# Patient Record
Sex: Female | Born: 1960
Health system: Southern US, Community
[De-identification: ages and names within clinical notes are randomized; demographics above are authoritative.]

## PROBLEM LIST (undated history)

## (undated) DIAGNOSIS — F419 Anxiety disorder, unspecified: Secondary | ICD-10-CM

## (undated) DIAGNOSIS — M109 Gout, unspecified: Secondary | ICD-10-CM

## (undated) DIAGNOSIS — K760 Fatty (change of) liver, not elsewhere classified: Secondary | ICD-10-CM

## (undated) DIAGNOSIS — E039 Hypothyroidism, unspecified: Secondary | ICD-10-CM

## (undated) DIAGNOSIS — E669 Obesity, unspecified: Secondary | ICD-10-CM

## (undated) DIAGNOSIS — G5600 Carpal tunnel syndrome, unspecified upper limb: Secondary | ICD-10-CM

## (undated) DIAGNOSIS — K219 Gastro-esophageal reflux disease without esophagitis: Secondary | ICD-10-CM

## (undated) DIAGNOSIS — F4310 Post-traumatic stress disorder, unspecified: Secondary | ICD-10-CM

## (undated) DIAGNOSIS — M797 Fibromyalgia: Secondary | ICD-10-CM

## (undated) DIAGNOSIS — K589 Irritable bowel syndrome without diarrhea: Secondary | ICD-10-CM

## (undated) DIAGNOSIS — M1990 Unspecified osteoarthritis, unspecified site: Secondary | ICD-10-CM

## (undated) DIAGNOSIS — J189 Pneumonia, unspecified organism: Secondary | ICD-10-CM

## (undated) DIAGNOSIS — K8020 Calculus of gallbladder without cholecystitis without obstruction: Secondary | ICD-10-CM

## (undated) DIAGNOSIS — I10 Essential (primary) hypertension: Secondary | ICD-10-CM

## (undated) DIAGNOSIS — M170 Bilateral primary osteoarthritis of knee: Secondary | ICD-10-CM

## (undated) HISTORY — DX: Unspecified osteoarthritis, unspecified site: M19.90

## (undated) HISTORY — DX: Irritable bowel syndrome, unspecified: K58.9

## (undated) HISTORY — PX: PILONIDAL CYST EXCISION: SHX744

## (undated) HISTORY — DX: Fibromyalgia: M79.7

## (undated) HISTORY — DX: Pneumonia, unspecified organism: J18.9

## (undated) HISTORY — DX: Calculus of gallbladder without cholecystitis without obstruction: K80.20

## (undated) HISTORY — PX: APPENDECTOMY: SHX54

## (undated) HISTORY — PX: CARDIAC CATHETERIZATION: SHX172

## (undated) HISTORY — DX: Gastro-esophageal reflux disease without esophagitis: K21.9

## (undated) HISTORY — DX: Obesity, unspecified: E66.9

## (undated) HISTORY — PX: HERNIA REPAIR: SHX51

## (undated) HISTORY — DX: Essential (primary) hypertension: I10

## (undated) HISTORY — DX: Anxiety disorder, unspecified: F41.9

## (undated) HISTORY — PX: ABDOMINAL HYSTERECTOMY: SHX81

---

## 1989-02-14 HISTORY — PX: AXILLARY HIDRADENITIS EXCISION: SUR522

## 1989-02-14 HISTORY — PX: KNEE ARTHROSCOPY: SHX127

## 1997-09-12 ENCOUNTER — Other Ambulatory Visit: Admission: RE | Admit: 1997-09-12 | Discharge: 1997-09-12 | Payer: Self-pay | Admitting: Obstetrics & Gynecology

## 1997-09-19 ENCOUNTER — Encounter: Admission: RE | Admit: 1997-09-19 | Discharge: 1997-09-19 | Payer: Self-pay | Admitting: Obstetrics & Gynecology

## 1997-10-03 ENCOUNTER — Encounter: Admission: RE | Admit: 1997-10-03 | Discharge: 1997-10-03 | Payer: Self-pay | Admitting: Internal Medicine

## 1997-10-10 ENCOUNTER — Encounter: Admission: RE | Admit: 1997-10-10 | Discharge: 1997-10-10 | Payer: Self-pay | Admitting: Obstetrics & Gynecology

## 1997-10-17 ENCOUNTER — Encounter: Admission: RE | Admit: 1997-10-17 | Discharge: 1997-10-17 | Payer: Self-pay | Admitting: Obstetrics & Gynecology

## 1997-11-13 ENCOUNTER — Inpatient Hospital Stay (HOSPITAL_COMMUNITY): Admission: RE | Admit: 1997-11-13 | Discharge: 1997-11-16 | Payer: Self-pay | Admitting: Obstetrics & Gynecology

## 1997-11-20 ENCOUNTER — Inpatient Hospital Stay (HOSPITAL_COMMUNITY): Admission: EM | Admit: 1997-11-20 | Discharge: 1997-11-20 | Payer: Self-pay | Admitting: Obstetrics & Gynecology

## 1997-12-05 ENCOUNTER — Encounter: Admission: RE | Admit: 1997-12-05 | Discharge: 1997-12-05 | Payer: Self-pay | Admitting: Obstetrics & Gynecology

## 1998-01-30 ENCOUNTER — Encounter: Admission: RE | Admit: 1998-01-30 | Discharge: 1998-01-30 | Payer: Self-pay | Admitting: Obstetrics & Gynecology

## 1998-05-24 ENCOUNTER — Emergency Department (HOSPITAL_COMMUNITY): Admission: EM | Admit: 1998-05-24 | Discharge: 1998-05-24 | Payer: Self-pay | Admitting: Emergency Medicine

## 1998-10-31 ENCOUNTER — Encounter: Payer: Self-pay | Admitting: Plastic Surgery

## 1998-11-02 ENCOUNTER — Inpatient Hospital Stay (HOSPITAL_COMMUNITY): Admission: RE | Admit: 1998-11-02 | Discharge: 1998-11-05 | Payer: Self-pay | Admitting: Plastic Surgery

## 1999-02-04 ENCOUNTER — Inpatient Hospital Stay (HOSPITAL_COMMUNITY): Admission: RE | Admit: 1999-02-04 | Discharge: 1999-02-10 | Payer: Self-pay | Admitting: Plastic Surgery

## 1999-08-26 ENCOUNTER — Encounter: Admission: RE | Admit: 1999-08-26 | Discharge: 1999-09-12 | Payer: Self-pay | Admitting: Plastic Surgery

## 1999-12-25 ENCOUNTER — Other Ambulatory Visit: Admission: RE | Admit: 1999-12-25 | Discharge: 1999-12-25 | Payer: Self-pay | Admitting: Internal Medicine

## 2000-01-02 ENCOUNTER — Ambulatory Visit (HOSPITAL_COMMUNITY): Admission: RE | Admit: 2000-01-02 | Discharge: 2000-01-02 | Payer: Self-pay | Admitting: Internal Medicine

## 2000-01-02 ENCOUNTER — Encounter: Payer: Self-pay | Admitting: Internal Medicine

## 2000-01-29 ENCOUNTER — Encounter: Payer: Self-pay | Admitting: Internal Medicine

## 2000-01-29 ENCOUNTER — Encounter: Admission: RE | Admit: 2000-01-29 | Discharge: 2000-01-29 | Payer: Self-pay | Admitting: Internal Medicine

## 2000-04-07 ENCOUNTER — Ambulatory Visit (HOSPITAL_BASED_OUTPATIENT_CLINIC_OR_DEPARTMENT_OTHER): Admission: RE | Admit: 2000-04-07 | Discharge: 2000-04-07 | Payer: Self-pay | Admitting: Urology

## 2000-09-07 ENCOUNTER — Inpatient Hospital Stay (HOSPITAL_COMMUNITY): Admission: RE | Admit: 2000-09-07 | Discharge: 2000-09-13 | Payer: Self-pay | Admitting: Plastic Surgery

## 2000-09-07 ENCOUNTER — Encounter (INDEPENDENT_AMBULATORY_CARE_PROVIDER_SITE_OTHER): Payer: Self-pay | Admitting: Specialist

## 2000-10-07 ENCOUNTER — Encounter: Admission: RE | Admit: 2000-10-07 | Discharge: 2000-11-10 | Payer: Self-pay | Admitting: Plastic Surgery

## 2000-12-02 ENCOUNTER — Encounter: Payer: Self-pay | Admitting: Internal Medicine

## 2000-12-02 ENCOUNTER — Encounter: Admission: RE | Admit: 2000-12-02 | Discharge: 2000-12-02 | Payer: Self-pay | Admitting: Internal Medicine

## 2001-02-11 ENCOUNTER — Other Ambulatory Visit: Admission: RE | Admit: 2001-02-11 | Discharge: 2001-02-11 | Payer: Self-pay | Admitting: Internal Medicine

## 2001-02-16 ENCOUNTER — Encounter: Payer: Self-pay | Admitting: Internal Medicine

## 2001-02-16 ENCOUNTER — Encounter: Admission: RE | Admit: 2001-02-16 | Discharge: 2001-02-16 | Payer: Self-pay | Admitting: Hematology and Oncology

## 2001-02-18 ENCOUNTER — Encounter: Admission: RE | Admit: 2001-02-18 | Discharge: 2001-02-18 | Payer: Self-pay | Admitting: Internal Medicine

## 2001-02-18 ENCOUNTER — Encounter: Payer: Self-pay | Admitting: Internal Medicine

## 2001-03-21 ENCOUNTER — Emergency Department (HOSPITAL_COMMUNITY): Admission: EM | Admit: 2001-03-21 | Discharge: 2001-03-21 | Payer: Self-pay | Admitting: Emergency Medicine

## 2001-03-21 ENCOUNTER — Encounter: Payer: Self-pay | Admitting: Emergency Medicine

## 2001-04-27 ENCOUNTER — Inpatient Hospital Stay (HOSPITAL_COMMUNITY): Admission: RE | Admit: 2001-04-27 | Discharge: 2001-04-30 | Payer: Self-pay | Admitting: Obstetrics and Gynecology

## 2001-04-27 ENCOUNTER — Encounter (INDEPENDENT_AMBULATORY_CARE_PROVIDER_SITE_OTHER): Payer: Self-pay

## 2001-04-27 HISTORY — PX: ABDOMINAL EXPLORATION SURGERY: SHX538

## 2001-04-27 HISTORY — PX: BILATERAL SALPINGOOPHORECTOMY: SHX1223

## 2001-06-27 ENCOUNTER — Emergency Department (HOSPITAL_COMMUNITY): Admission: EM | Admit: 2001-06-27 | Discharge: 2001-06-27 | Payer: Self-pay | Admitting: Emergency Medicine

## 2001-06-27 ENCOUNTER — Encounter: Payer: Self-pay | Admitting: Emergency Medicine

## 2001-12-27 ENCOUNTER — Other Ambulatory Visit: Admission: RE | Admit: 2001-12-27 | Discharge: 2001-12-27 | Payer: Self-pay | Admitting: Obstetrics and Gynecology

## 2002-02-21 ENCOUNTER — Encounter: Admission: RE | Admit: 2002-02-21 | Discharge: 2002-02-21 | Payer: Self-pay | Admitting: Internal Medicine

## 2002-02-21 ENCOUNTER — Encounter: Payer: Self-pay | Admitting: Internal Medicine

## 2002-04-19 ENCOUNTER — Ambulatory Visit (HOSPITAL_COMMUNITY): Admission: RE | Admit: 2002-04-19 | Discharge: 2002-04-19 | Payer: Self-pay | Admitting: Internal Medicine

## 2002-04-19 ENCOUNTER — Encounter: Payer: Self-pay | Admitting: Internal Medicine

## 2002-11-28 ENCOUNTER — Encounter: Payer: Self-pay | Admitting: Internal Medicine

## 2002-11-28 ENCOUNTER — Encounter: Admission: RE | Admit: 2002-11-28 | Discharge: 2002-11-28 | Payer: Self-pay | Admitting: Internal Medicine

## 2003-01-23 ENCOUNTER — Emergency Department (HOSPITAL_COMMUNITY): Admission: EM | Admit: 2003-01-23 | Discharge: 2003-01-23 | Payer: Self-pay | Admitting: Emergency Medicine

## 2003-01-23 ENCOUNTER — Encounter: Payer: Self-pay | Admitting: Emergency Medicine

## 2003-03-17 ENCOUNTER — Ambulatory Visit (HOSPITAL_COMMUNITY): Admission: RE | Admit: 2003-03-17 | Discharge: 2003-03-17 | Payer: Self-pay | Admitting: Gastroenterology

## 2003-04-05 ENCOUNTER — Ambulatory Visit (HOSPITAL_BASED_OUTPATIENT_CLINIC_OR_DEPARTMENT_OTHER): Admission: RE | Admit: 2003-04-05 | Discharge: 2003-04-05 | Payer: Self-pay | Admitting: Orthopedic Surgery

## 2003-04-08 ENCOUNTER — Emergency Department (HOSPITAL_COMMUNITY): Admission: EM | Admit: 2003-04-08 | Discharge: 2003-04-08 | Payer: Self-pay | Admitting: *Deleted

## 2003-04-08 ENCOUNTER — Encounter: Payer: Self-pay | Admitting: Emergency Medicine

## 2003-11-28 ENCOUNTER — Emergency Department (HOSPITAL_COMMUNITY): Admission: EM | Admit: 2003-11-28 | Discharge: 2003-11-28 | Payer: Self-pay | Admitting: Family Medicine

## 2004-02-11 ENCOUNTER — Emergency Department (HOSPITAL_COMMUNITY): Admission: EM | Admit: 2004-02-11 | Discharge: 2004-02-11 | Payer: Self-pay | Admitting: Emergency Medicine

## 2004-04-26 ENCOUNTER — Encounter: Admission: RE | Admit: 2004-04-26 | Discharge: 2004-04-26 | Payer: Self-pay | Admitting: Orthopedic Surgery

## 2004-07-10 ENCOUNTER — Emergency Department (HOSPITAL_COMMUNITY): Admission: EM | Admit: 2004-07-10 | Discharge: 2004-07-10 | Payer: Self-pay | Admitting: Emergency Medicine

## 2004-07-30 ENCOUNTER — Ambulatory Visit (HOSPITAL_COMMUNITY): Admission: RE | Admit: 2004-07-30 | Discharge: 2004-07-30 | Payer: Self-pay | Admitting: Obstetrics and Gynecology

## 2004-09-25 ENCOUNTER — Encounter: Admission: RE | Admit: 2004-09-25 | Discharge: 2004-09-25 | Payer: Self-pay | Admitting: Internal Medicine

## 2004-10-17 ENCOUNTER — Ambulatory Visit (HOSPITAL_COMMUNITY): Admission: RE | Admit: 2004-10-17 | Discharge: 2004-10-17 | Payer: Self-pay | Admitting: Obstetrics and Gynecology

## 2004-10-30 ENCOUNTER — Ambulatory Visit (HOSPITAL_COMMUNITY): Admission: RE | Admit: 2004-10-30 | Discharge: 2004-10-30 | Payer: Self-pay | Admitting: Internal Medicine

## 2004-11-24 ENCOUNTER — Emergency Department (HOSPITAL_COMMUNITY): Admission: EM | Admit: 2004-11-24 | Discharge: 2004-11-25 | Payer: Self-pay | Admitting: Emergency Medicine

## 2005-02-03 ENCOUNTER — Emergency Department (HOSPITAL_COMMUNITY): Admission: EM | Admit: 2005-02-03 | Discharge: 2005-02-03 | Payer: Self-pay | Admitting: Emergency Medicine

## 2005-05-05 ENCOUNTER — Inpatient Hospital Stay (HOSPITAL_BASED_OUTPATIENT_CLINIC_OR_DEPARTMENT_OTHER): Admission: RE | Admit: 2005-05-05 | Discharge: 2005-05-05 | Payer: Self-pay | Admitting: Interventional Cardiology

## 2005-09-03 ENCOUNTER — Ambulatory Visit (HOSPITAL_COMMUNITY): Admission: RE | Admit: 2005-09-03 | Discharge: 2005-09-03 | Payer: Self-pay | Admitting: Obstetrics and Gynecology

## 2005-09-24 ENCOUNTER — Encounter: Admission: RE | Admit: 2005-09-24 | Discharge: 2005-09-24 | Payer: Self-pay | Admitting: Obstetrics and Gynecology

## 2005-12-11 ENCOUNTER — Emergency Department (HOSPITAL_COMMUNITY): Admission: EM | Admit: 2005-12-11 | Discharge: 2005-12-11 | Payer: Self-pay | Admitting: Family Medicine

## 2006-02-24 ENCOUNTER — Emergency Department (HOSPITAL_COMMUNITY): Admission: EM | Admit: 2006-02-24 | Discharge: 2006-02-25 | Payer: Self-pay | Admitting: Emergency Medicine

## 2006-07-22 ENCOUNTER — Other Ambulatory Visit: Admission: RE | Admit: 2006-07-22 | Discharge: 2006-07-22 | Payer: Self-pay | Admitting: Internal Medicine

## 2006-07-24 ENCOUNTER — Inpatient Hospital Stay (HOSPITAL_COMMUNITY): Admission: EM | Admit: 2006-07-24 | Discharge: 2006-07-25 | Payer: Self-pay | Admitting: Emergency Medicine

## 2006-12-12 ENCOUNTER — Emergency Department (HOSPITAL_COMMUNITY): Admission: AD | Admit: 2006-12-12 | Discharge: 2006-12-12 | Payer: Self-pay | Admitting: Family Medicine

## 2007-02-09 ENCOUNTER — Encounter: Admission: RE | Admit: 2007-02-09 | Discharge: 2007-02-09 | Payer: Self-pay | Admitting: Internal Medicine

## 2007-07-16 ENCOUNTER — Emergency Department (HOSPITAL_COMMUNITY): Admission: EM | Admit: 2007-07-16 | Discharge: 2007-07-16 | Payer: Self-pay | Admitting: Emergency Medicine

## 2007-09-01 ENCOUNTER — Emergency Department (HOSPITAL_COMMUNITY): Admission: EM | Admit: 2007-09-01 | Discharge: 2007-09-01 | Payer: Self-pay | Admitting: Emergency Medicine

## 2007-11-21 ENCOUNTER — Emergency Department (HOSPITAL_COMMUNITY): Admission: EM | Admit: 2007-11-21 | Discharge: 2007-11-21 | Payer: Self-pay | Admitting: Family Medicine

## 2008-02-10 ENCOUNTER — Emergency Department (HOSPITAL_COMMUNITY): Admission: EM | Admit: 2008-02-10 | Discharge: 2008-02-10 | Payer: Self-pay | Admitting: Family Medicine

## 2008-07-06 ENCOUNTER — Emergency Department (HOSPITAL_COMMUNITY): Admission: EM | Admit: 2008-07-06 | Discharge: 2008-07-06 | Payer: Self-pay | Admitting: Emergency Medicine

## 2008-08-01 ENCOUNTER — Emergency Department (HOSPITAL_COMMUNITY): Admission: EM | Admit: 2008-08-01 | Discharge: 2008-08-01 | Payer: Self-pay | Admitting: Family Medicine

## 2008-08-18 ENCOUNTER — Encounter: Admission: RE | Admit: 2008-08-18 | Discharge: 2008-08-18 | Payer: Self-pay | Admitting: Family Medicine

## 2008-09-07 ENCOUNTER — Ambulatory Visit (HOSPITAL_COMMUNITY): Admission: RE | Admit: 2008-09-07 | Discharge: 2008-09-07 | Payer: Self-pay | Admitting: Orthopedic Surgery

## 2008-10-19 ENCOUNTER — Emergency Department (HOSPITAL_COMMUNITY): Admission: EM | Admit: 2008-10-19 | Discharge: 2008-10-19 | Payer: Self-pay | Admitting: Emergency Medicine

## 2009-05-30 ENCOUNTER — Encounter: Admission: RE | Admit: 2009-05-30 | Discharge: 2009-05-30 | Payer: Self-pay | Admitting: Family Medicine

## 2009-11-14 ENCOUNTER — Emergency Department (HOSPITAL_COMMUNITY): Admission: EM | Admit: 2009-11-14 | Discharge: 2009-11-14 | Payer: Self-pay | Admitting: Family Medicine

## 2010-04-01 ENCOUNTER — Emergency Department (HOSPITAL_COMMUNITY): Admission: EM | Admit: 2010-04-01 | Discharge: 2010-04-01 | Payer: Self-pay | Admitting: Emergency Medicine

## 2010-07-07 ENCOUNTER — Encounter: Payer: Self-pay | Admitting: Obstetrics and Gynecology

## 2010-07-07 ENCOUNTER — Encounter: Payer: Self-pay | Admitting: Family Medicine

## 2010-07-07 ENCOUNTER — Encounter: Payer: Self-pay | Admitting: Internal Medicine

## 2010-09-02 LAB — DIFFERENTIAL
Basophils Absolute: 0 10*3/uL (ref 0.0–0.1)
Basophils Relative: 0 % (ref 0–1)
Monocytes Absolute: 0.6 10*3/uL (ref 0.1–1.0)
Neutro Abs: 3.3 10*3/uL (ref 1.7–7.7)
Neutrophils Relative %: 45 % (ref 43–77)

## 2010-09-02 LAB — CBC
MCHC: 33.6 g/dL (ref 30.0–36.0)
Platelets: 242 10*3/uL (ref 150–400)
RDW: 13.5 % (ref 11.5–15.5)

## 2010-09-02 LAB — URIC ACID: Uric Acid, Serum: 5.3 mg/dL (ref 2.4–7.0)

## 2010-09-18 ENCOUNTER — Other Ambulatory Visit: Payer: Self-pay | Admitting: Registered Nurse

## 2010-09-18 DIAGNOSIS — Z1231 Encounter for screening mammogram for malignant neoplasm of breast: Secondary | ICD-10-CM

## 2010-09-24 ENCOUNTER — Ambulatory Visit
Admission: RE | Admit: 2010-09-24 | Discharge: 2010-09-24 | Disposition: A | Discharge status: Home / Self Care | Payer: Self-pay | Source: Ambulatory Visit | Attending: Internal Medicine | Admitting: Internal Medicine

## 2010-09-24 DIAGNOSIS — Z1231 Encounter for screening mammogram for malignant neoplasm of breast: Secondary | ICD-10-CM

## 2010-09-24 LAB — POCT RAPID STREP A (OFFICE): Streptococcus, Group A Screen (Direct): POSITIVE — AB

## 2010-10-01 LAB — POCT I-STAT, CHEM 8
Creatinine, Ser: 0.6 mg/dL (ref 0.4–1.2)
Hemoglobin: 14.3 g/dL (ref 12.0–15.0)
Sodium: 141 mEq/L (ref 135–145)
TCO2: 30 mmol/L (ref 0–100)

## 2010-10-30 ENCOUNTER — Inpatient Hospital Stay (INDEPENDENT_AMBULATORY_CARE_PROVIDER_SITE_OTHER)
Admission: RE | Admit: 2010-10-30 | Discharge: 2010-10-30 | Disposition: A | Discharge status: Home / Self Care | Payer: 59 | Source: Ambulatory Visit | Attending: Family Medicine | Admitting: Family Medicine

## 2010-10-30 DIAGNOSIS — J069 Acute upper respiratory infection, unspecified: Secondary | ICD-10-CM

## 2010-11-01 NOTE — H&P (Signed)
[facility]  Patient:    Nina Pitts, Nina Pitts Visit Number: [id number] MRN: [id number]          Service Type: Attending:  Sheronette A. Cherly Hensen, M.D. Dictated by:   Sheria Lang. Cherly Hensen, M.D. Adm. Date:  04/27/01                           History and Physical  CHIEF COMPLAINT:              Pelvic pain.  HISTORY OF PRESENT ILLNESS:   This is a 50 year old gravida 4 para 3, 0-1-3, married black female with a history of total abdominal hysterectomy in 1999, who is now being admitted for exploratory laparotomy and bilateral salpingo-oophorectomy secondary to pelvic pain.  The patient complains of constant pelvic pressure and pain requiring the use of Vicodin for the pain. She also has noted pain with intercourse.  The patient had had a hysterectomy secondary to dysmenorrhea, chronic pelvic pain syndrome, and dysfunctional uterine bleeding.  On review of her previous operative report, findings noted at the time of her surgery were that both tubes were dilated distally with phimosis at the fimbriated end and filmy adhesions to the broad ligament as well as the omentum to both adnexa.  The patient now presents for further management.  ALLERGIES:                    ASPIRIN gives nausea.  MEDICATIONS:                  1. Nexium.                               2. Vicodin p.r.n.  PAST MEDICAL HISTORY:         Gastroesophageal reflux disease.  PAST SURGICAL HISTORY:        1. Cesarean section x 3.                               2. Right arm and back cyst removal.                               3. Umbilical hernia repair.                               4. Appendectomy.                               5. Total abdominal hysterectomy in 1999.                               6. Dilatation and curettage.  PAST OBSTETRICAL HISTORY:     1. Cesarean section x 3.                               2. One elective termination.  FAMILY HISTORY:               Paternal  grandmother, breast cancer in late 49s. Father with possibility of esophageal cancer.  Mother, diabetes.  SOCIAL HISTORY:  Married.  Three children.  Works as Production assistant, radio at [employer]  REVIEW OF SYSTEMS:            Vaginal bleeding, without source identified.  No hot flashes.  PHYSICAL EXAMINATION:  GENERAL:                      Well-developed, well-nourished black female in no acute distress.  VITAL SIGNS:                  Blood pressure 110/70.  Weight 205.6 pounds.  SKIN:                         No lesions.  HEENT:                        Sclerae anicteric.  Conjunctivae pink. Oropharynx negative.  HEART:                        Regular rate and rhythm without murmur.  LUNGS:                        Clear to auscultation.  BREAST:                       Soft, nontender, no palpable mass.  ABDOMEN:                      Soft.  Healed transverse scar.  Nontender.  PELVIC:                       Vulva showed no lesions.  Vagina had no discharge, no blood in the vault.  Bimanual examination, no palpable mass, slightly tender in bilateral quadrants.  LABORATORY DATA:              On February 16, 2001 transabdominal and transvaginal ultrasound showed left ovary and right ovary normal, no free fluid.  Abdominal ultrasound was negative.  IMPRESSION:                   Pelvic pain.  PLAN:                         1. Admission.                               2. Exploratory laparotomy, bilateral                                  salpingo-oophorectomy.                               3. Antibiotic prophylaxis.                               4. Anti-embolic stockings.                               5. Bowel prep prior to surgery.  6. Risks of the procedure were reviewed with                                  the patient including, but not limited to,                                  infection, bleeding, injury to surrounding                                   organ structures, in particular ureters,                                  bladder or bowel, bowel obstruction from                                  internal scar tissue, continued pain from                                  the scar tissue that is probably still                                  present, surgical menopause and its                                  consequences including vasomotor symptoms,                                  increased risk of cardiovascular disease,                                  possible continuation of the vaginal                                  bleeding since the etiology of that was not                                  found on her evaluation, bleeding which may                                  require blood transfusion and risks of                                  transfusion including hepatitis, HIV                                  transmission, acute reaction to the blood  products were all discussed.  Estrogen                                  replacement therapy was discussed and its                                  side effects discussed.  Postoperative care                                  and criteria for discharge were reviewed                                  and discussion regarding issue of smoking                                  with plans for cessation discussed. Dictated by:   Sheria Lang. Cherly Hensen, M.D. Attending:  Sheronette A. Cherly Hensen, M.D. DD:  04/26/01 TD:  04/27/01 Job: [id number]

## 2010-11-01 NOTE — Discharge Summary (Signed)
NAMENAFISAH, RUNIONS              ACCOUNT NO.:  [id number]   MEDICAL RECORD NO.:  [id number]          PATIENT TYPE:  INP   LOCATION:  3743                         FACILITY:  [facility]   PHYSICIAN:  Beckey Rutter, MD  DATE OF BIRTH:  22-Aug-1960   DATE OF ADMISSION:  07/23/2006  DATE OF DISCHARGE:  07/25/2006                               DISCHARGE SUMMARY   PRIMARY CARE PHYSICIAN:  Dr. Elisabeth Most.   CHIEF COMPLAINT:  Spasm over left breast.   HISTORY OF PRESENT ILLNESS:  This is a 50 year old African-American  female with past medical history of fibromyalgia and hypertension as  well as hypothyroidism.  She developed spasm of her thighs and legs and  the left breast.  She was admitted to rule out acute chronic  syndrome/myocardial infarction.  During hospitalization her EKG remained  without change.  Her cardiac enzymes x3 were negative.  She had  improvement regarding the spasm during hospitalization.  The patient was  stable on discharge and she was instructed to followup with her primary  physician and cardiologist for further assessment of those spells and  management.   DISCHARGE DIAGNOSES:  1. Chest pain/spasm likely endocardiogenic in origin.  2. Fibromyalgia.  3. Hypothyroidism.  4. Hypertension.   DISCHARGE MEDICATION:  1. Synthroid 137 mcg.  2. Cardizem 180 mg daily.  3. Aspirin 81 mg p.r.n.   DISCHARGE PLAN:  Mrs. Moynahan was agreeable to the discharge planning.  Followup with her primary physician Dr. Elisabeth Most and consider  cardiology for possibly stress test evaluation as an outpatient.      Beckey Rutter, MD  Electronically Signed     EME/MEDQ  D:  09/11/2006  T:  09/11/2006  Job:  [id number]

## 2010-11-01 NOTE — Op Note (Signed)
[facility]  Patient:    Nina Pitts, Nina Pitts Visit Number: [id number] MRN: [id number]          Service Type: GYN Location: [id number] Attending Physician:  Maxie Better Dictated by:   Sheria Lang. Cherly Hensen, M.D. Proc. Date: 04/27/01 Admit Date:  04/27/2001   CC:         Velna Hatchet, M.D.   Operative Report  PREOPERATIVE DIAGNOSIS:       Pelvic pain.  POSTOPERATIVE DIAGNOSES:      1. Pelvic pain.                               2. Abdominopelvic adhesions.  PROCEDURES:                   1. Exploratory laparotomy.                               2. Bilateral salpingo-oophorectomy.                               3. Lysis of adhesions.  SURGEON:                      Sheronette A. Cherly Hensen, M.D.  ASSISTANT:                    Silverio Lay, M.D.  ANESTHESIA:                   General.  DESCRIPTION OF PROCEDURE:     Under adequate general anesthesia, the patient was placed in the supine position.  SHe was sterilely prepped and draped in the usual fashion.  A ring clamp with gauze was placed in the vagina to identify the vaginal cuff.  An indwelling Foley catheter had been placed sterilely.  The patient received antibiotic prophylaxis as well as antiembolic stocking placement.  The patient had a previous Pfannenstiel skin incision. Marcaine 0.25% was injected along this incision line.  A scalpel was then utilized to make an incision in the skin through the same scar.  This was carried down to the rectus fascia using Bovie cautery.  The rectus fascia was incised in the midline and extended bilaterally.  At this point, it was also noted that there were omental adhesions to the anterior abdominal wall.  A minimal amount of the rectus muscle was identified bilaterally.  The peritoneum was entered as soon as the rectus fascia was opened.  On exploration of the abdomen, the omentum was noted to be adherent to the anterior abdominal wall, limiting  the ability to fully palpate the upper abdomen.  Therefore, the omentum was lysed off the anterior abdominal wall with resultant ability to assess the upper abdomen, where a normal liver edge was palpated and normal kidneys.  It was noted that the omentum was also attached to the right lower quadrant and the right fallopian tube was attached to both the omentum and to the right anterior abdominal wall.  It had some peritubal adhesions, as well.  The ovary was small, but otherwise normal in appearance.  In the cul-de-sac, there were some adhesions which were lysed. On the left, the left tube and ovary could be identified.  They were attached to the bowel medially as well as to the anterior  abdominal wall and the pelvic side wall.  The bowels were then packed upwardly.  The adhesions around the right tube and ovary were freed.  The ureter could easily been peristalsing on the right.  With a Babcock, the right adnexa was grasped.  The infundibulopelvic ligament was isolated.  The vessels were clamped, cut and suture ligated with 0 Vicryl and free tied with 0 Vicryl proximally and distally.  With a clamp along the base of the adnexa, the right tube and ovary were removed.  This pedicle was then free tied with 0 Vicryl x 2.  Further lysis of adhesions was then performed on the left.  In particular, the omentum and the bowel adhesions were also lysed.  The left ureter could not be seen on the medial aspect of the peritoneum.  Therefore, the retroperitoneal space on the left was opened and, with dissection using a peanut, the ureter was subsequently identified and noted to be normal in caliber and peristalsing. The left tube and ovary were then further isolated.  The left ovarian vessels were then clamped, cut and free tied with 0 Vicryl x 2 proximally and the rest of the tube and ovary were then clamped, cut and the base of the site of the left adnexa was free tied with 0 Vicryl x 2.  Small  bleeders on the omentum were cauterized.  Areas of defect on the omentum were opened and free tied with 0 Vicryl sutures.  The omentum was inspected and hemostasis was noted throughout.  No areas of defect remained and it was freed from its anterior abdominal wall attachments.  The abdomen was then irrigated and suctioned. Packings were removed.  The self retaining retractor was removed.  The undersurface of the rectus fascia was identified and good hemostasis noted. The rectus fascia was closed with 0 Vicryl x 2.  The subcuticular area was irrigated, small bleeders cauterized and 3-0 plain interrupted sutures were placed.  The skin was approximated using Ethicon staples.  SPECIMEN:                     Both right and left tube and ovary sent to pathology.  ESTIMATED BLOOD LOSS:         50 cc.  INTRAOPERATIVE FLUID:         1300 cc crystalloid.  URINE OUTPUT:                 300 cc clear yellow urine.  SPONGE AND INSTRUMENT COUNTS:            Correct x 2.  COMPLICATIONS:                None.  DISPOSITION:                  The patient tolerated the procedure well and was transferred to the recovery room in stable condition. Dictated by:   Sheria Lang. Cherly Hensen, M.D. Attending Physician:  Maxie Better DD:  04/27/01 TD:  04/28/01 Job: [id number]

## 2010-11-01 NOTE — Op Note (Signed)
NAME:  Nina Pitts, Nina Pitts                        ACCOUNT NO.:  [id number]   MEDICAL RECORD NO.:  [id number]                   PATIENT TYPE:  AMB   LOCATION:  ENDO                                 FACILITY:  [facility]   PHYSICIAN:  Anselmo Rod, M.D.               DATE OF BIRTH:  01-23-1961   DATE OF PROCEDURE:  03/17/2003  DATE OF DISCHARGE:                                 OPERATIVE REPORT   PROCEDURE PERFORMED:  Screening colonoscopy.   ENDOSCOPIST:  Anselmo Rod, M.D.   INSTRUMENT USED:  Olympus video colonoscope.   INDICATION FOR PROCEDURE:  A 50 year old African-American female with a  history of rectal bleeding and a family history of colon cancer in a  paternal uncle, undergoing a screening colonoscopy.  Rule out colonic  polyps, masses, etc.   PREPROCEDURE PREPARATION:  Informed consent was procured from the patient.  The patient was fasted for eight hours prior to the procedure and prepped  with a bottle of magnesium citrate and a gallon of GoLYTELY the night prior  to the procedure.   PREPROCEDURE PHYSICAL:  VITAL SIGNS:  The patient had stable vital signs.  NECK:  Supple.  CHEST:  Clear to auscultation.  S1, S2 regular.  ABDOMEN:  Soft with normal bowel sounds.   DESCRIPTION OF PROCEDURE:  The patient was placed in the left lateral  decubitus position and sedated with 40 mg of Demerol and 3 mg of Versed  intravenously.  Once the patient was adequately sedate and maintained on low-  flow oxygen and continuous cardiac monitoring, the Olympus video colonoscope  was advanced from the rectum to the cecum.  The appendiceal orifice and  ileocecal valve were visualized.  No masses, polyps, erosions, erosions,  ulcerations, or diverticula were seen.  A small anal fissure was seen on  anal examination.  The rest of the exam was normal.  The patient tolerated  the procedure well without complications.   IMPRESSION:  Essentially normal colonoscopy up to the cecum except for  a  small anal fissure seen at the anal verge.   RECOMMENDATIONS:  1. Repeat CRC screening is recommended in the next five years unless the     patient develops any abnormal symptoms in the interim.  2.     Continue Cardizem cream and lidocaine gel for symptomatic relief.  3. A high-fiber diet with liberal fluid intake.  4. Outpatient follow-up in the next two weeks or earlier if need be.                                               Anselmo Rod, M.D.    JNM/MEDQ  D:  03/17/2003  T:  03/18/2003  Job:  [id number]   cc:   Candyce Churn. Allyne Gee, M.D.  [phone]  [address]  [address]  [place]  [place] [address]  Fax: [phone]

## 2010-11-01 NOTE — Op Note (Signed)
[place]  Patient:    Nina Pitts, Nina Pitts                     MRN: [id number] Proc. Date: 04/07/00 Adm. Date:  [id number] Attending:  Lindaann Slough CC:         Velna Hatchet, M.D.   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  OPERATION:  Cystoscopy and urethral dilation.  SURGEON:  Lindaann Slough, M.D.  ANESTHESIA:  General  INDICATION:  The patient is a 50 year old female who has been complaining of frequency, hesitancy, and suprapubic discomfort.  She was treated with antibiotics without any improvement of her symptoms.  She is scheduled for cystoscopy.  DESCRIPTION OF PROCEDURE:  Under general anesthesia, the patient was prepped and draped and placed in the dorsal lithotomy position.  A #23 Wappler cystoscope was inserted in the bladder.  The bladder mucosa is normal.  There is no stone or tumor in the bladder.  The urethral orifices are in normal position and shape.  There is no evidence of submucosal hemorrhage.  The cystoscope was then removed.  The urethra was then dilated with #32 [place].  The patient tolerated the procedure well and left the operating room in satisfactory condition to post-anesthesia care unit. DD:  04/07/00 TD:  04/07/00 Job: [id number]

## 2010-11-01 NOTE — Discharge Summary (Signed)
[place]  Patient:    Nina Pitts, Nina Pitts                     MRN: [id number] Adm. Date:  [id number] Disc. Date: [id number] Attending:  Chapman Moss                           Discharge Summary  DISCHARGE DIAGNOSIS:  Left axillary hydradenitis.  PROCEDURES: (Performed on September 13, 2000, by Dr. Odis Luster) 1. Excision of left axillary hydradenitis. 2. Closure of a large portion of the wound with complex wound closure. 3. Preparation of the remaining site. 4. Split-thickness skin graft, left axilla.  HISTORY OF PRESENT ILLNESS:  This 50 year old woman has had a long history of hydradenitis.  She has already had the right side treated two years ago and has done well from that.  She now presents with eruptions in left axilla which are fairly quiet at this time.  No evidence of any active infection.  She presents for excision and grafting of that area.  For further details of History and Physical, please see chart.  HOSPITAL COURSE:  At admission, she was taken to surgery at which time the left axillary area was excised.  The wound edges either closed primarily or embrocated and split-thickness skin graft applied.  She tolerated this well. A V.A.C. was placed with the skin graft.  She remained afebrile postoperatively.  On the third day, the graft was checked and found to be a full take.  She was begun on dressing changes at that point.  She continued in a shoulder immobilizer to avoid too much motion of the area.  Donor site was exposed on the first postoperative day and was dried with a hair dryer and looks very healthy.  There is no evidence of infection.  She was continued on IV antibiotics for two days and then p.o. antibiotics thereafter until discharge.  There is no evidence of infection.  The wound is healing nicely.  The donor site is fine.  ACTIVITY:  She wore a shoulder immobilizer most of the time at this point. She may use the left  arm for light activities, but no raising the arm.  She will change a dry 4 x 4 dressing over the area once a day or twice a day at home as needed.  DISCHARGE MEDICATION:  Mepergan Fortes total #40 given, one p.o. q.4h. p.r.n. for pain.  FOLLOWUP:  We will recheck her in the office in four days.  SPECIAL INSTRUCTIONS:  No showers yet. DD:  09/13/00 TD:  09/14/00 Job: [id number]

## 2010-11-01 NOTE — Op Note (Signed)
NAME:  Nina Pitts, Nina Pitts                        ACCOUNT NO.:  [id number]   MEDICAL RECORD NO.:  [id number]                   PATIENT TYPE:  AMB   LOCATION:  DSC                                  FACILITY:  [facility]   PHYSICIAN:  Feliberto Gottron. Turner Daniels, M.D.                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   POSTOPERATIVE DIAGNOSIS:  Right knee cartilaginous loose bodies with donor  site from the lateral femoral condyle.   PROCEDURE:  Right knee arthroscopic removal of multiple cartilaginous loose  bodies and debridement of grade 3 to focal grade 4 chondromalacia from a  crater-like defect in the lateral femoral condyle.   SURGEON:  Feliberto Gottron. Turner Daniels, M.D.   FIRST ASSISTANT:  Erskine Squibb B. Jannet Mantis.   ANESTHESIA:  General LMA.   ESTIMATED BLOOD LOSS:  Minimal.   FLUIDS REPLACED:  800 mL crystalloid.   DRAINS:  None.   TOURNIQUET TIME:  None.   INDICATION FOR PROCEDURE:  A 49 year old woman with right knee pain that has  failed conservative treatment with anti-inflammatory medicines, exercises,  and observation.  Because of persistent medial joint line pain with catching  and popping, she is taken for arthroscopic evaluation and treatment of her  right knee.   DESCRIPTION OF PROCEDURE:  Patient identified by arm band, taken to the  operating room at [facility] day [facility].  Appropriate anesthetic monitors  were attached and general endotracheal anesthesia induced with the patient  in supine position.  A lateral post applied to the table.  The right lower  extremity prepped and draped in the usual sterile fashion from the ankle to  the mid-thigh.  Using a #11 blade, standard inferomedial and inferolateral  peripatellar portals were then made, allowing introduction of the  arthroscope through the  inferolateral portal and the outflow through the  inferomedial portal.  We immediately encounter cartilaginous loose bodies in  the joint fluid that were continuously removed with the irrigation solution  from the arthroscope during the procedure.  The suprapatellar pouch,  patella, and trochlea were in good condition, as was the medial articular  and meniscal cartilage, although we did find two more cartilaginous loose  bodies in the medial compartment.  The ACL and the PCL were intact.  Moving  to the lateral side, we encountered a 1 cm x 12 mm grade 3-4 chondromalacia  crater-like defect in the lateral femoral condyle, which had some flap tears  requiring debridement with a 3.5 Gator sucker shaver.  Moving into the  gutter, the gutter was also cleared at this point and a couple more loose  bodies were found there as well as some underneath the lateral meniscus.  These were removed with the 3.5 Gator sucker shaver.  We then  irrigated the knee some more with the arthroscope.  The instruments were  removed.  A dressing of Xeroform, 4 x 4 dressing sponges, Webril, and an Ace  wrap applied.  The patient was awakened and taken to the recovery room  without difficulty.                                               Feliberto Gottron. Turner Daniels, M.D.    Ovid Curd  D:  04/05/2003  T:  04/05/2003  Job:  [id number]

## 2010-11-01 NOTE — H&P (Signed)
NAME:  Nina Pitts, Nina Pitts              ACCOUNT NO.:  [id number]   MEDICAL RECORD NO.:  [id number]          PATIENT TYPE:  INP   LOCATION:  1844                         FACILITY:  [facility]   PHYSICIAN:  Mobolaji B. Bakare, M.D.DATE OF BIRTH:  09-13-1960   DATE OF ADMISSION:  07/23/2006  DATE OF DISCHARGE:                              HISTORY & PHYSICAL   PHYSICIAN ROSTER:  Primary care physician:  Lovenia Kim, D.O.  Cardiologist:  Lyn Records, M.D.   CHIEF COMPLAINT:  Spasm over left breast.   HISTORY OF PRESENT ILLNESS:  Mrs. Tax is a 50 year old African  American female with history of fibromyalgia, hypertension and  hypothyroidism.  Yesterday she developed spasms over her thighs and  legs, also left breast.  She was concerned about the spasm and she came  to the emergency room.  She was seen by emergency room physician who had  an EKG done and this was different from an EKG done in September 2007  with new T wave inversion in V2 to V4.  The patient clearly denies chest  pain, shortness of breath, diaphoresis, or radiation or tingling in the  left arm.  Of note is that she had a cardiac catheterization in November  2006 which showed normal coronary arteries, normal left ventricular  function.  This was preceded by a false positive Cardiolite study.  The  patient has been relatively in good health since then.   REVIEW OF SYSTEMS:  There is no cough, shortness of breath, abdominal  pain, chest pain, diarrhea, vomiting, nausea, diaphoresis.  No  headaches.   PAST MEDICAL HISTORY:  1. Gastroesophageal reflux disease.  2. Hypertension.  3. Hypothyroidism.   PAST SURGICAL HISTORY:  1. Cesarean section.  2. Appendectomy.  3. Total abdominal hysterectomy.  4. Dilatation and curettage.  5. Removal of a cyst from the right upper back.   FAMILY HISTORY:  Father passed away from esophageal cancer with  metastases to the lung.  Mother is alive. She has some heart trouble.  She  has had an MI.   MEDICATIONS:  1. Cardizem 180 mg daily.  2. Synthroid 135 mcg daily.  3. Ecotrin.  4. Robaxin 500 mg q.6h. p.r.n.  5. Vicodin 5/500 every six hours p.r.n.   ALLERGIES:  ASPIRIN causes nausea.  No specific allergy.   SOCIAL HISTORY:  She is a nonsmoker.  She does not drink alcohol or use  drugs.  She is on disability secondary to the fibromyalgia.   PHYSICAL EXAMINATION:  VITAL SIGNS:  Initial temperature 98.4, blood  pressure 125/63, pulse 54, respiratory 18, oxygen saturation 98% on room  air.  GENERAL:  On examination, the patient is comfortable.  In no acute  respiratory distress.  HEENT: Normocephalic, atraumatic.  Pupils equal, round, and reactive to  light.  Extraocular muscle movement intact.  LUNGS:  Clear clinically to auscultation.  CARDIOVASCULAR:  S1 and S2 regular.  No murmur, no gallop, no rub.  ABDOMEN:  Nondistended, soft, nontender.  Bowel sounds present.  EXTREMITIES:  No pedal edema or calf tenderness.  Dorsalis pedis pulses  palpable bilaterally.  CNS:  No focal neurological deficit.   LABORATORY DATA:  EKG shows sinus bradycardia with a heart rate of 52.  EKG was repeated and this showed sinus bradycardia with a heart rate of  52 with T wave inversion in V2 to V4, clearly a change from an old EKG  in September 2007.  Chest x-ray not available.  Initial laboratory data  showed cardiac markers at the point of care, troponin level of 0.075,  myoglobin 68.5, creatinine 0.7.  Hemoglobin 15, hematocrit 44.  Sodium  138, potassium 4.3, chloride 111, glucose 92, BUN 16, bicarb 25.   ASSESSMENT/PLAN:  Ms. Hinckley is a 50 year old African American female  with history of fibromyalgia presenting with muscle spasm involving the  thighs and left breast area.  She was seen in emergency room and had an  EKG which showed T wave inversion in V2 to V4, different from old EKG.  She does not have any chest pain.  She had a cardiac catheterization in   November 2006 which showed normal coronary arteries.  Clearly, this is  unlikely to be cardiac in origin.  Nevertheless, we will admit to cycle  cardiac enzymes and obtain a 2-D echocardiogram.   ADMISSION DIAGNOSIS:  1. Abnormal EKG.  2. Fibromyalgia.  3. Hypertension.  4. Hypothyroidism.   PLAN:  Admit to telemetry, cycle cardiac enzymes q.8h. x3, aspirin 325  mg daily.  Will reduce Cardizem from 180 to 120 mg daily in view of the  sinus bradycardia.  Will check fasting lipid profile and TSH.  Obtain 2-  D echocardiogram.      Mobolaji B. Corky Downs, M.D.  Electronically Signed     MBB/MEDQ  D:  07/24/2006  T:  07/24/2006  Job:  [id number]   cc:   Lovenia Kim, D.O.  Lyn Records, M.D.

## 2010-11-01 NOTE — Cardiovascular Report (Signed)
NAMELURLINE, CAVER NO.:  [id number]   MEDICAL RECORD NO.:  [id number]          PATIENT TYPE:  OIB   LOCATION:  1962                         FACILITY:  [facility]   PHYSICIAN:  Lyn Records, M.D.   DATE OF BIRTH:  07/20/1960   DATE OF PROCEDURE:  05/05/2005  DATE OF DISCHARGE:                              CARDIAC CATHETERIZATION   INDICATIONS FOR STUDY:  Abnormal Cardiolite study demonstrating the  possibility of mild anterolateral ischemia and a large region of inferior  wall ischemia.  This was a two day study and the specificity of the study  could be decreased because of the two day protocol and also the patient's  size.   PROCEDURE PERFORMED:  1.  Left heart catheterization.  2.  Selective coronary angiography.  3.  Left ventriculography date May 05, 2005.   DESCRIPTION OF PROCEDURE:  After informed consent a 4-French sheath was  placed in the right femoral artery using the modified Seldinger technique.  A 4-French A-2 multipurpose catheter was used for hemodynamic recordings,  left ventriculography by hand injection, and selective right coronary  angiography.  We then used a  4-French left catheter for left coronary  angiography.  The patient tolerated the procedure without complications.  Hemostasis was achieved by manual compression.   RESULTS:  1.  Hemodynamic data:      1.  Aortic pressure 110/65.      2.  The left ventricular pressure 110/10 mmHg.  2.  Left ventriculography:  Left ventricle is mildly dilated.  Overall LV      function is normal.  EF is estimated to be at least 55% without regional      wall motion abnormality. No mitral regurgitation was noted.  3.  Coronary angiography:      1.  Left main coronary:  Normal.      2.  Left anterior descending coronary:  Normal.      3.  Ramus intermedius branch:  This is a large vessel that bifurcates          and is normal.      4.  Circumflex artery:  Circumflex gives origin to one obtuse  marginal          that arises distally and is normal.      5.  Right coronary:  The right coronary is a dominant vessel giving two          left ventricular branches and a PDA.  The right coronary is also          normal.   CONCLUSION:  1.  Normal coronary arteries.  2.  Normal LV function.  3.  False-positive Cardiolite study.   PLAN:  Continue risk factor modification.  No further cardiac evaluation for  the patient's chest pain.  Would suggest considering GI origin or  musculoskeletal origin.      Lyn Records, M.D.  Electronically Signed     HWS/MEDQ  D:  05/05/2005  T:  05/05/2005  Job:  [id number]   cc:   L. Lupe Carney, M.D.  Fax: [phone]

## 2010-11-01 NOTE — Discharge Summary (Signed)
[facility]  Patient:    Nina Pitts, Nina Pitts Visit Number: [id number] MRN: [id number]          Service Type: GYN Location: 9300 [id number] Attending Physician:  Maxie Better Dictated by:   Sheria Lang. Cherly Hensen, M.D. Admit Date:  04/27/2001 Discharge Date: 04/30/2001   CC:         Velna Hatchet, M.D.   Discharge Summary  ADMISSION DIAGNOSIS:  Pelvic pain.  PROCEDURE:  Exploratory laparotomy, bilateral salpingo-oophorectomy, lysis of adhesions.  DISCHARGE DIAGNOSES: 1. Pelvic pain. 2. Abdominopelvic adhesions.  HOSPITAL COURSE:  The patient was admitted.  She was taken to the operating room where she underwent the above procedure.  Findings at the time of surgery was omentum adherent to the anterior abdominal wall which was lysed.  The right tube and ovary was adherent to the omentum and the right anterior abdominal wall.  There was some vaginal vault adhesions which was lysed.  Left tube and ovary were encased with adhesions to the surrounding area, as well as the bowel.  Both ureters were seen and peristolising.  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  The patients postoperative course was unremarkable.  She had a slow bowel of function until late postoperative day #2, and subsequently by postoperative day #3, she had a bowel movement.  Her CBC on postoperative day #1, showed a hematocrit of 33.4, hemoglobin 11.6, white count was 13.2.  During her hospitalization the patient was placed on estrogen patch.  Her pathology showed right ovary and fallopian tube that were benign with fibrous adhesions and a benign paratomal cyst.  Similar finding was found for the left tube and ovary.  The patient was deemed well to be discharged home.  Her incision showed staples.  No erythema, induration, or exudate.  DISPOSITION:  Home.  CONDITION ON DISCHARGE:  Stable.  DISCHARGE PLANS: 1. For staple removal in the office on Tuesday. 2.  Postoperative followup in four weeks.  DISCHARGE MEDICATIONS: 1. Dilaudid 2 mg #20 one to two tablets q.3-4h. p.r.n. pain. 2. Motrin 800 mg one p.o. q.6h. p.r.n. pain. 3. Climara 0.1 mg patch once per week.  DISCHARGE INSTRUCTIONS:  Call for temperature greater than or equal to 100.4, nothing in vagina for four weeks, no heavy lifting or driving for two weeks. Call if increasing incisional pain, redness, or drainage from the incision site, severe abdominal pain, nausea or vomiting. Dictated by:   Sheria Lang. Cherly Hensen, M.D. Attending Physician:  Maxie Better DD:  04/30/01 TD:  04/30/01 Job: [id number]

## 2010-11-01 NOTE — Op Note (Signed)
[facility]  Patient:    Nina Pitts, Nina Pitts                     MRN: [id number] Proc. Date: 09/07/00 Adm. Date:  [id number] Attending:  Chapman Moss                           Operative Report  PREOPERATIVE DIAGNOSIS:  Left axillary hidradenitis.  POSTOPERATIVE DIAGNOSIS:  Left axillary hydradenitis.  OPERATION PERFORMED: 1. Left axillary hydradenitis. 2. Preparation of site and split thickness skin grafting to the left axilla. 3. Placement of VAC.  SURGEON:  Teena Irani. Odis Luster, M.D.  ANESTHESIA:  General.  ESTIMATED BLOOD LOSS:  Minimal.  INDICATIONS FOR PROCEDURE:  The patient is a 50 year old woman with a significant problem with hidradenitis.  She has already had her right axilla excised and  grafted with a good result a couple of years ago.  She now has the same problem in the left axilla.  The disease is quiescent at the present time and it is felt to be an opportune time for excision and grafting.  She understands the nature of the procedure and the risks and possible complications including skin graft loss and the added complications that can occur due to the fact that she has continued smoking.  She wishes to proceed.  DESCRIPTION OF PROCEDURE:  The patient was taken to the operating room and placed supine.  After successful induction of general anesthesia, she was prepped with Betadine and draped with sterile drapes.  The left axilla was approached first marking the hairbearing area of the axilla.  This area was then excised very carefully taking the excision down into the subcutaneous tissues down to fascia.  Great care was taken to avoid any damage to the underlying axillary structures.  Meticulous hemostasis with electrocautery and thorough irrigation with saline.  The wound edges were then imbricated with 2-0 Vicryl interrupted inverted deep sutures placed as horizontal mattress sutures and the bed having been thus  prepared, a split thickness skin graft was then harvested from the left thigh using a Zimmer dermatome on a 0.020 inch setting.  The graft was meshed 1.5 to 1, applied to the wound and was secured around the periphery  with skin staples.  A piece of adaptic was then placed over this  and a VAC sponge was positioned in order to secure the graft to the bed.  The donor site was dressed with scarlet red, Adaptic, 4 x 4s, ABD.  She was transferred to the recovery room in stable condition having tolerated the procedure well. DD:  09/07/00 TD:  09/07/00 Job: [id number]

## 2011-03-23 ENCOUNTER — Emergency Department (HOSPITAL_COMMUNITY)
Admission: EM | Admit: 2011-03-23 | Discharge: 2011-03-23 | Disposition: A | Discharge status: Home / Self Care | Payer: 59 | Attending: Emergency Medicine | Admitting: Emergency Medicine

## 2011-03-23 DIAGNOSIS — Z79899 Other long term (current) drug therapy: Secondary | ICD-10-CM | POA: Insufficient documentation

## 2011-03-23 DIAGNOSIS — I10 Essential (primary) hypertension: Secondary | ICD-10-CM | POA: Insufficient documentation

## 2011-03-23 DIAGNOSIS — M25519 Pain in unspecified shoulder: Secondary | ICD-10-CM | POA: Insufficient documentation

## 2011-03-23 DIAGNOSIS — Y92009 Unspecified place in unspecified non-institutional (private) residence as the place of occurrence of the external cause: Secondary | ICD-10-CM | POA: Insufficient documentation

## 2011-03-23 DIAGNOSIS — M25569 Pain in unspecified knee: Secondary | ICD-10-CM | POA: Insufficient documentation

## 2011-03-23 DIAGNOSIS — M545 Low back pain, unspecified: Secondary | ICD-10-CM | POA: Insufficient documentation

## 2011-03-23 DIAGNOSIS — W010XXA Fall on same level from slipping, tripping and stumbling without subsequent striking against object, initial encounter: Secondary | ICD-10-CM | POA: Insufficient documentation

## 2011-03-23 DIAGNOSIS — M79609 Pain in unspecified limb: Secondary | ICD-10-CM | POA: Insufficient documentation

## 2011-03-23 DIAGNOSIS — T148XXA Other injury of unspecified body region, initial encounter: Secondary | ICD-10-CM | POA: Insufficient documentation

## 2011-03-23 DIAGNOSIS — E039 Hypothyroidism, unspecified: Secondary | ICD-10-CM | POA: Insufficient documentation

## 2011-07-10 ENCOUNTER — Other Ambulatory Visit: Payer: Self-pay | Admitting: Internal Medicine

## 2011-07-10 DIAGNOSIS — E039 Hypothyroidism, unspecified: Secondary | ICD-10-CM

## 2011-07-15 ENCOUNTER — Ambulatory Visit
Admission: RE | Admit: 2011-07-15 | Discharge: 2011-07-15 | Disposition: A | Discharge status: Home / Self Care | Payer: Medicare Other | Source: Ambulatory Visit | Attending: Internal Medicine | Admitting: Internal Medicine

## 2011-07-15 DIAGNOSIS — E039 Hypothyroidism, unspecified: Secondary | ICD-10-CM

## 2012-03-02 ENCOUNTER — Encounter: Payer: Self-pay | Admitting: Gastroenterology

## 2012-05-28 ENCOUNTER — Other Ambulatory Visit: Payer: Self-pay | Admitting: Internal Medicine

## 2012-05-28 DIAGNOSIS — Z1231 Encounter for screening mammogram for malignant neoplasm of breast: Secondary | ICD-10-CM

## 2012-07-07 ENCOUNTER — Ambulatory Visit: Payer: Medicare Other

## 2012-09-13 ENCOUNTER — Ambulatory Visit (HOSPITAL_COMMUNITY): Payer: Medicare Other | Admitting: Psychiatry

## 2012-10-07 ENCOUNTER — Other Ambulatory Visit (HOSPITAL_COMMUNITY): Payer: Self-pay | Admitting: Internal Medicine

## 2012-10-07 ENCOUNTER — Ambulatory Visit (HOSPITAL_COMMUNITY)
Admission: RE | Admit: 2012-10-07 | Discharge: 2012-10-07 | Disposition: A | Discharge status: Home / Self Care | Payer: Medicare Other | Source: Ambulatory Visit | Attending: Internal Medicine | Admitting: Internal Medicine

## 2012-10-07 ENCOUNTER — Ambulatory Visit (HOSPITAL_COMMUNITY): Admission: RE | Admit: 2012-10-07 | Payer: Medicare Other | Source: Ambulatory Visit

## 2012-10-07 ENCOUNTER — Other Ambulatory Visit: Payer: Self-pay | Admitting: Internal Medicine

## 2012-10-07 DIAGNOSIS — R110 Nausea: Secondary | ICD-10-CM

## 2012-10-07 DIAGNOSIS — R140 Abdominal distension (gaseous): Secondary | ICD-10-CM

## 2012-10-07 DIAGNOSIS — R109 Unspecified abdominal pain: Secondary | ICD-10-CM

## 2012-10-07 DIAGNOSIS — R141 Gas pain: Secondary | ICD-10-CM | POA: Insufficient documentation

## 2012-10-07 DIAGNOSIS — R142 Eructation: Secondary | ICD-10-CM | POA: Insufficient documentation

## 2012-10-07 DIAGNOSIS — K8020 Calculus of gallbladder without cholecystitis without obstruction: Secondary | ICD-10-CM | POA: Insufficient documentation

## 2012-10-07 DIAGNOSIS — R143 Flatulence: Secondary | ICD-10-CM | POA: Insufficient documentation

## 2012-10-07 DIAGNOSIS — R7989 Other specified abnormal findings of blood chemistry: Secondary | ICD-10-CM | POA: Insufficient documentation

## 2012-10-11 ENCOUNTER — Other Ambulatory Visit: Payer: Self-pay

## 2012-10-12 ENCOUNTER — Encounter: Payer: Self-pay | Admitting: Internal Medicine

## 2012-10-28 ENCOUNTER — Encounter: Payer: Self-pay | Admitting: Internal Medicine

## 2012-11-01 ENCOUNTER — Ambulatory Visit: Payer: Self-pay | Admitting: Internal Medicine

## 2012-11-16 ENCOUNTER — Encounter: Payer: Self-pay | Admitting: Internal Medicine

## 2012-11-17 ENCOUNTER — Ambulatory Visit (INDEPENDENT_AMBULATORY_CARE_PROVIDER_SITE_OTHER): Payer: Medicare Other | Admitting: Internal Medicine

## 2012-11-17 ENCOUNTER — Encounter: Payer: Self-pay | Admitting: Internal Medicine

## 2012-11-17 ENCOUNTER — Ambulatory Visit
Admission: RE | Admit: 2012-11-17 | Discharge: 2012-11-17 | Disposition: A | Discharge status: Home / Self Care | Payer: Medicare Other | Source: Ambulatory Visit | Attending: Internal Medicine | Admitting: Internal Medicine

## 2012-11-17 VITALS — BP 102/70 | HR 58 | Ht 64.0 in | Wt 249.0 lb

## 2012-11-17 DIAGNOSIS — E669 Obesity, unspecified: Secondary | ICD-10-CM | POA: Insufficient documentation

## 2012-11-17 DIAGNOSIS — K819 Cholecystitis, unspecified: Secondary | ICD-10-CM

## 2012-11-17 DIAGNOSIS — K7689 Other specified diseases of liver: Secondary | ICD-10-CM

## 2012-11-17 DIAGNOSIS — K8020 Calculus of gallbladder without cholecystitis without obstruction: Secondary | ICD-10-CM

## 2012-11-17 DIAGNOSIS — R1013 Epigastric pain: Secondary | ICD-10-CM

## 2012-11-17 DIAGNOSIS — K3189 Other diseases of stomach and duodenum: Secondary | ICD-10-CM

## 2012-11-17 DIAGNOSIS — E039 Hypothyroidism, unspecified: Secondary | ICD-10-CM | POA: Insufficient documentation

## 2012-11-17 DIAGNOSIS — K589 Irritable bowel syndrome without diarrhea: Secondary | ICD-10-CM

## 2012-11-17 DIAGNOSIS — K760 Fatty (change of) liver, not elsewhere classified: Secondary | ICD-10-CM

## 2012-11-17 DIAGNOSIS — R748 Abnormal levels of other serum enzymes: Secondary | ICD-10-CM

## 2012-11-17 DIAGNOSIS — Z1231 Encounter for screening mammogram for malignant neoplasm of breast: Secondary | ICD-10-CM

## 2012-11-17 MED ORDER — OMEPRAZOLE 20 MG PO CPDR: 20.0000 mg | DELAYED_RELEASE_CAPSULE | Freq: Every day | ORAL | Status: DC

## 2012-11-17 NOTE — Patient Instructions (Signed)
Continue taking omeprazole daily  You are being referred to Dr. Dwain Sarna at [facility], you will hear from their office within the next day.  Follow up with Dr. Rhea Belton in office in 2 months                                               We are excited to introduce MyChart, a new best-in-class service that provides you online access to important information in your electronic medical record. We want to make it easier for you to view your health information - all in one secure location - when and where you need it. We expect MyChart will enhance the quality of care and service we provide.  When you register for MyChart, you can:    View your test results.    Request appointments and receive appointment reminders via email.    Request medication renewals.    View your medical history, allergies, medications and immunizations.    Communicate with your physician's office through a password-protected site.    Conveniently print information such as your medication lists.  To find out if MyChart is right for you, please talk to a member of our clinical staff today. We will gladly answer your questions about this free health and wellness tool.  If you are age 66 or older and want a member of your family to have access to your record, you must provide written consent by completing a proxy form available at our office. Please speak to our clinical staff about guidelines regarding accounts for patients younger than age 44.  As you activate your MyChart account and need any technical assistance, please call the MyChart technical support line at [phone]) or email your question to [email or web address]. If you email your question(s), please include your name, a return phone number and the best time to reach you.  If you have non-urgent health-related questions, you can send a message to our office through MyChart at [email or web address]. If you have a medical emergency, call  911.  Thank you for using MyChart as your new health and wellness resource!     MyChart licensed from [employer],  4540-9811. Patents Pending.

## 2012-11-17 NOTE — Progress Notes (Addendum)
Patient ID: Nina Pitts, female   DOB: 12/15/1960, 52 y.o.   MRN: [id number] HPI: [name] is a 52 yo female with PMH of hypothyroidism, hypertension, obesity and IBS who is seen in consultation at the request of Dr. Renne Crigler for evaluation of elevated liver enzymes and epigastric abdominal pain. The patient reports over the last month she has been followed by her primary care office for fluctuating liver function tests. Unfortunately, these are not available for my review at the time of the consultation. She reports they've been checked 3 or 4 times and they have been elevated each time except for once.  Over this time. She has been experiencing epigastric and right upper quadrant episodic pain. She reports that this is crescendo decrescendo type episodes which didn't happen without warning. Perhaps it is worse after eating. During these episodes she frequently has swelling and some flushing. There is nausea associated with the pain but no vomiting. These episodes last 15 minutes to an hour. Separate from this she's had some burning epigastric pain more associated with eating. She was started on omeprazole about a week ago by her primary care office and reports about 80% improvement in this discomfort. She denies significant heartburn with the omeprazole and no dysphagia or odynophagia. She reports she has a long-standing history of IBS with somewhat alternating bowel function. She does report loose stools at times which can be "forceful". She's had no rectal bleeding or melena. She does report a previous colonoscopy performed by Dr. Loreta Ave about 5 years ago. She remembers this with some clear because she felt like she was incompletely sedated.  No family history of liver disease other than an uncle who was an alcoholic. She denies alcohol use. No history of hepatitis, jaundice, ascites or lower extremity edema.  She did have an ultrasound done recently which revealed gallstones and fatty liver  Past  Medical History  Diagnosis Date  . Anxiety   . Arthritis   . Fibromyalgia   . Gallstones   . Hypertension   . Thyroid disease   . IBS (irritable bowel syndrome)   . Obesity   . UTI (lower urinary tract infection)   . Hernia      Past Surgical History  Procedure Laterality Date  . Cesarean section    . Hernia repair    . Cyst removal under arm    . Cyst removal tailbone    . Appendectomy      Current Outpatient Prescriptions  Medication Sig Dispense Refill  . aspirin 81 MG chewable tablet Chew 81 mg by mouth daily.      Marland Kitchen levothyroxine (SYNTHROID, LEVOTHROID) 200 MCG tablet Take 200 mcg by mouth daily before breakfast.      . LORazepam (ATIVAN) 0.5 MG tablet Take 0.5 mg by mouth every 8 (eight) hours.      Marland Kitchen losartan (COZAAR) 50 MG tablet Take 50 mg by mouth daily.      . Multiple Vitamin (MULTIVITAMIN) tablet Take 1 tablet by mouth daily.      Marland Kitchen omeprazole (PRILOSEC) 20 MG capsule Take 1 capsule (20 mg total) by mouth daily.  30 capsule  6  . Probiotic Product (ALIGN) 4 MG CAPS Take 1 capsule by mouth daily.      . simethicone (MYLICON) 125 MG chewable tablet Chew 125 mg by mouth every 6 (six) hours as needed for flatulence. Take 2 daily and at bedtime as needed       No current facility-administered medications for this  visit.    Allergies  Allergen Reactions  . Aspirin Nausea And Vomiting    Adult strength only  . Robaxin (Methocarbamol) Itching  . Vicodin (Hydrocodone-Acetaminophen) Itching    Family History  Problem Relation Age of Onset  . Pancreatic cancer Father   . Esophageal cancer Father   . Diabetes Mother   . Heart disease Mother     History  Substance Use Topics  . Smoking status: Former Smoker    Quit date: 11/18/1998  . Smokeless tobacco: Never Used  . Alcohol Use: No    ROS: As per history of present illness, otherwise negative  BP 102/70  Pulse 58  Ht 5\' 4"  (1.626 m)  Wt 249 lb (112.946 kg)  BMI 42.72 kg/m2 Constitutional:  Well-developed and well-nourished. No distress. HEENT: Normocephalic and atraumatic. Oropharynx is clear and moist. No oropharyngeal exudate. Conjunctivae are normal.  No scleral icterus. Neck: Neck supple. Trachea midline. Cardiovascular: Normal rate, regular rhythm and intact distal pulses. No M/R/G Pulmonary/chest: Effort normal and breath sounds normal. No wheezing, rales or rhonchi. Abdominal: Soft, nontender, nondistended. Bowel sounds active throughout. There are no masses palpable. No hepatosplenomegaly. Extremities: no clubbing, cyanosis, or edema Lymphadenopathy: No cervical adenopathy noted. Neurological: Alert and oriented to person place and time. Skin: Skin is warm and dry. No rashes noted. Psychiatric: Normal mood and affect. Behavior is normal.  RELEVANT LABS AND IMAGING: CBC    Component Value Date/Time   WBC 7.4 11/14/2009 1917   RBC 4.15 11/14/2009 1917   HGB 12.9 11/14/2009 1917   HCT 38.5 11/14/2009 1917   PLT 242 11/14/2009 1917   MCV 92.7 11/14/2009 1917   MCHC 33.6 11/14/2009 1917   RDW 13.5 11/14/2009 1917   LYMPHSABS 2.9 11/14/2009 1917   MONOABS 0.6 11/14/2009 1917   EOSABS 0.5 11/14/2009 1917   BASOSABS 0.0 11/14/2009 1917    CMP     Component Value Date/Time   NA 141 08/01/2008 1522   K 4.4 08/01/2008 1522   CL 103 08/01/2008 1522   GLUCOSE 71 08/01/2008 1522   BUN 14 08/01/2008 1522   CREATININE 0.6 08/01/2008 1522   .Clinical Data:  Elevated LFTs, abdominal pain, nausea, bloating   ULTRASOUND ABDOMEN:   Technique:  Sonography of upper abdominal structures was performed. Image quality degraded due to body habitus and bowel gas.   Comparison:  CT abdomen and pelvis 10/30/2004   Gallbladder:  Multiple shadowing calculi within the partially contracted gallbladder. No gallbladder wall thickening, pericholecystic fluid or sonographic Murphy sign.   Common bile duct:  Upper normal caliber 6 mm diameter   Liver:  Echogenic, likely fatty infiltration, though this can  be seen with cirrhosis and certain infiltrative disorders.  No definite focal hepatic mass or nodularity.  Suboptimal visualization of intrahepatic detail due to poor sound transmission through echogenic parenchyma.  Hepatopetal portal venous flow.   IVC:  Poorly visualized due to poor sound transmission through liver   Pancreas:  Poorly visualized due to the bowel gas   Spleen:  Normal appearance, 5.4 cm length   Right kidney:  11.9 cm length. Normal morphology without mass or hydronephrosis.   Left kidney:  10.8 cm length. Normal morphology without mass or hydronephrosis.   Aorta:  Normal caliber   Other:  No free fluid   IMPRESSION: Cholelithiasis without evidence of acute cholecystitis. Probable fatty infiltration of liver. Inadequate visualization of IVC and pancreas.     ASSESSMENT/PLAN: 52 yo female with PMH of hypothyroidism, hypertension, obesity  and IBS who is seen in consultation at the request of Dr. Renne Crigler for evaluation of elevated liver enzymes and epigastric abdominal pain. The patient reports over the last month she has been followed by her primary care office for fluctuating liver function tests.   1.  Elevated LFTs -- unfortunately I do not have her recent liver function tests to determine if her elevation is more hepatocellular or cholestatic in nature. It does seem like she is having symptomatic cholelithiasis which at times can lead to abnormal liver numbers, see #2. She also had evidence for fatty liver on ultrasound and given her obesity would be at risk for nonalcoholic steatohepatitis.  I will await the labs requested from her PCP office and plan to discuss this more in followup with her. She may be a candidate for vitamin E if the pattern is consistent with NASH  2.  Cholelithiasis -- her episodic epigastric and right upper quadrant pain would be consistent with biliary colic. Again I would like to review her labs, but based on clinical history feel it is  appropriate that she be referred for evaluation of cholecystectomy with general surgery. We will make this referral for her. I've advised a low-fat diet  3.  Dyspepsia -- separate from her episodic attacks of right upper quadrant pain she reports a more dyspeptic epigastric discomfort which has responded to omeprazole. I recommended that she continue omeprazole 20 mg daily, and reminded her that this is best taken 30 minutes to an hour before her first meal of the day. We will followup this issue at next visit  4.  IBS -- she has had a previous colonoscopy and we have requested these records. Her IBS is long-standing and she seems to be benefiting from the probiotic added recently. She will continue this for now  5.  CRC screening -- it sounds as if she is up to date for CRC screening given her colonoscopy which was reportedly normal 5 years ago. If her recollection is correct, then she would be due for screening at the 10 year mark.  Addendum, records are seen from Dr. Loreta Ave Colonoscopy dated 03/17/2003 -- ostomy to the cecum. "Essentially normal colonoscopy up to cecum except for small anal fissure seen at the anal verge"  --Recommendation: Based on this she would be due repeat colonoscopy for screening in October of this year

## 2012-12-03 ENCOUNTER — Ambulatory Visit (INDEPENDENT_AMBULATORY_CARE_PROVIDER_SITE_OTHER): Payer: Self-pay | Admitting: General Surgery

## 2012-12-28 ENCOUNTER — Ambulatory Visit (INDEPENDENT_AMBULATORY_CARE_PROVIDER_SITE_OTHER): Payer: Medicare Other | Admitting: General Surgery

## 2012-12-31 ENCOUNTER — Encounter: Payer: Self-pay | Admitting: Internal Medicine

## 2013-01-31 ENCOUNTER — Encounter (INDEPENDENT_AMBULATORY_CARE_PROVIDER_SITE_OTHER): Payer: Self-pay | Admitting: General Surgery

## 2013-01-31 ENCOUNTER — Ambulatory Visit (INDEPENDENT_AMBULATORY_CARE_PROVIDER_SITE_OTHER): Payer: Medicare Other | Admitting: General Surgery

## 2013-01-31 VITALS — BP 124/74 | HR 64 | Temp 97.7°F | Resp 14 | Ht 64.0 in | Wt 226.6 lb

## 2013-01-31 DIAGNOSIS — K8020 Calculus of gallbladder without cholecystitis without obstruction: Secondary | ICD-10-CM

## 2013-01-31 NOTE — Progress Notes (Signed)
Patient ID: Nina Pitts, female   DOB: 03/11/1961, 52 y.o.   MRN: [id number]  Chief Complaint  Patient presents with  . New Evaluation    eval cholelithiasis    HPI Nina Pitts is a 52 y.o. female.  Referred by Dr Jay Pyrtle HPI 52 yom with history of elevated lfts and some ruq/epigastric abdominal pain.  She has been having ruq and epigastric pain that has been increasing in frequency.  She notes some flushing and some nausea.  No emesis.  There is some relation to food.  She has some heartburn associated with this that is controlled fairly well with PPIs.  This is not helping the pain though.  She has no dysphagia.  No real changes in her bms since this pain has started.  Nothin really helps when she has this pain.  She has undergone u/s that shows her to have cholelithiasis and presents today to have this evaluated.  Past Medical History  Diagnosis Date  . Anxiety   . Arthritis   . Fibromyalgia   . Gallstones   . Hypertension   . Thyroid disease   . IBS (irritable bowel syndrome)   . Obesity   . UTI (lower urinary tract infection)   . GERD (gastroesophageal reflux disease)     Past Surgical History  Procedure Laterality Date  . Cesarean section    . Appendectomy    . Abdominal hysterectomy    . Hernia repair      [facility] at age 10  . Axillary hidradenitis excision      bilateral with stsg  . Pilonidal cyst excision      Family History  Problem Relation Age of Onset  . Pancreatic cancer Father   . Esophageal cancer Father   . Cancer Father     esophagus, lungs  . Diabetes Mother   . Heart disease Mother   . Cancer Paternal Grandmother     breast    Social History History  Substance Use Topics  . Smoking status: Former Smoker    Quit date: 11/18/1998  . Smokeless tobacco: Never Used  . Alcohol Use: No    Allergies  Allergen Reactions  . Aspirin Nausea And Vomiting    Adult strength only  . Robaxin [Methocarbamol] Itching  . Vicodin  [Hydrocodone-Acetaminophen] Itching    Current Outpatient Prescriptions  Medication Sig Dispense Refill  . aspirin 81 MG chewable tablet Chew 81 mg by mouth daily.      . levothyroxine (SYNTHROID, LEVOTHROID) 200 MCG tablet Take 200 mcg by mouth daily before breakfast.      . LORazepam (ATIVAN) 0.5 MG tablet Take 0.5 mg by mouth every 8 (eight) hours.      . losartan (COZAAR) 50 MG tablet Take 50 mg by mouth daily.      . Multiple Vitamin (MULTIVITAMIN) tablet Take 1 tablet by mouth daily.      . omeprazole (PRILOSEC) 20 MG capsule Take 1 capsule (20 mg total) by mouth daily.  30 capsule  6  . Probiotic Product (ALIGN) 4 MG CAPS Take 1 capsule by mouth daily.      . simethicone (MYLICON) 125 MG chewable tablet Chew 125 mg by mouth every 6 (six) hours as needed for flatulence. Take 2 daily and at bedtime as needed       No current facility-administered medications for this visit.    Review of Systems Review of Systems  Constitutional: Negative for fever, chills and unexpected weight change.    HENT: Positive for sore throat and trouble swallowing. Negative for hearing loss, congestion and voice change.   Eyes: Negative for visual disturbance.  Respiratory: Negative for cough and wheezing.   Cardiovascular: Negative for chest pain, palpitations and leg swelling.  Gastrointestinal: Positive for nausea and diarrhea. Negative for vomiting, abdominal pain, constipation, blood in stool, abdominal distention and anal bleeding.  Genitourinary: Negative for hematuria, vaginal bleeding and difficulty urinating.  Musculoskeletal: Positive for arthralgias.  Skin: Negative for rash and wound.  Neurological: Negative for seizures, syncope and headaches.  Hematological: Negative for adenopathy. Does not bruise/bleed easily.  Psychiatric/Behavioral: Negative for confusion.    Blood pressure 124/74, pulse 64, temperature 97.7 F (36.5 C), temperature source Temporal, resp. rate 14, height 5' 4" (1.626  m), weight 226 lb 9.6 oz (102.785 kg).  Physical Exam Physical Exam  Vitals reviewed. Constitutional: She appears well-developed and well-nourished.  Eyes: No scleral icterus.  Neck: Neck supple.  Cardiovascular: Normal rate, regular rhythm and normal heart sounds.   Pulmonary/Chest: Effort normal and breath sounds normal. She has no wheezes. She has no rales.  Abdominal: Soft. Bowel sounds are normal. She exhibits no distension. There is tenderness in the right upper quadrant and epigastric area. No hernia.  Lymphadenopathy:    She has no cervical adenopathy.    Data Reviewed ULTRASOUND ABDOMEN:  Technique: Sonography of upper abdominal structures was performed.  Image quality degraded due to body habitus and bowel gas.  Comparison: CT abdomen and pelvis 10/30/2004  Gallbladder: Multiple shadowing calculi within the partially  contracted gallbladder. No gallbladder wall thickening,  pericholecystic fluid or sonographic Murphy sign.  Common bile duct: Upper normal caliber 6 mm diameter  Liver: Echogenic, likely fatty infiltration, though this can be  seen with cirrhosis and certain infiltrative disorders. No  definite focal hepatic mass or nodularity. Suboptimal  visualization of intrahepatic detail due to poor sound transmission  through echogenic parenchyma. Hepatopetal portal venous flow.  IVC: Poorly visualized due to poor sound transmission through  liver  Pancreas: Poorly visualized due to the bowel gas  Spleen: Normal appearance, 5.4 cm length  Right kidney: 11.9 cm length. Normal morphology without mass or  hydronephrosis.  Left kidney: 10.8 cm length. Normal morphology without mass or  hydronephrosis.  Aorta: Normal caliber  Other: No free fluid  IMPRESSION:  Cholelithiasis without evidence of acute cholecystitis.  Probable fatty infiltration of liver.  Inadequate visualization of IVC and pancreas   Assessment    GERD Symptomatic cholelithiasis    Plan    Lap chole with IOC, will check lfts again preop I do think some of her symptoms are related to her gallstones.  We discussed she will get better to some degree after surgery but may not be 100% I discussed the procedure in detail.   We discussed the risks and benefits of a laparoscopic cholecystectomy and possible cholangiogram including, but not limited to bleeding, infection, injury to surrounding structures such as the intestine or liver, bile leak, retained gallstones, need to convert to an open procedure, prolonged diarrhea, blood clots such as  DVT, common bile duct injury, anesthesia risks, and possible need for additional procedures.  The likelihood of improvement in symptoms and return to the patient's normal status is good. We discussed the typical post-operative recovery course.         Pauline Trainer 01/31/2013, 9:42 AM    

## 2013-02-02 ENCOUNTER — Encounter (HOSPITAL_COMMUNITY)
Admission: RE | Admit: 2013-02-02 | Discharge: 2013-02-02 | Disposition: A | Discharge status: Home / Self Care | Payer: Medicare Other | Source: Ambulatory Visit | Attending: Anesthesiology | Admitting: Anesthesiology

## 2013-02-02 ENCOUNTER — Encounter (HOSPITAL_COMMUNITY): Payer: Self-pay | Admitting: Pharmacy Technician

## 2013-02-02 ENCOUNTER — Encounter (HOSPITAL_COMMUNITY): Payer: Self-pay

## 2013-02-02 ENCOUNTER — Encounter (HOSPITAL_COMMUNITY)
Admission: RE | Admit: 2013-02-02 | Discharge: 2013-02-02 | Disposition: A | Discharge status: Home / Self Care | Payer: Medicare Other | Source: Ambulatory Visit | Attending: General Surgery | Admitting: General Surgery

## 2013-02-02 HISTORY — DX: Hypothyroidism, unspecified: E03.9

## 2013-02-02 HISTORY — DX: Carpal tunnel syndrome, unspecified upper limb: G56.00

## 2013-02-02 LAB — CBC WITH DIFFERENTIAL/PLATELET
Basophils Absolute: 0 10*3/uL (ref 0.0–0.1)
Basophils Relative: 0 % (ref 0–1)
Eosinophils Relative: 4 % (ref 0–5)
Lymphocytes Relative: 43 % (ref 12–46)
Neutro Abs: 3.9 10*3/uL (ref 1.7–7.7)
Platelets: 252 10*3/uL (ref 150–400)
RDW: 13.2 % (ref 11.5–15.5)
WBC: 8.3 10*3/uL (ref 4.0–10.5)

## 2013-02-02 LAB — COMPREHENSIVE METABOLIC PANEL
ALT: 15 U/L (ref 0–35)
AST: 13 U/L (ref 0–37)
Albumin: 3.4 g/dL — ABNORMAL LOW (ref 3.5–5.2)
CO2: 28 mEq/L (ref 19–32)
Calcium: 9.1 mg/dL (ref 8.4–10.5)
GFR calc non Af Amer: >90 mL/min (ref >90)
Sodium: 141 mEq/L (ref 135–145)

## 2013-02-02 MED ORDER — DEXTROSE 5 % IV SOLN
2.0000 g | INTRAVENOUS | Status: AC
  Administered 2013-02-03: 2 g via INTRAVENOUS
  Filled 2013-02-02: qty 2

## 2013-02-02 NOTE — Progress Notes (Signed)
02/02/13 1520  OBSTRUCTIVE SLEEP APNEA  Have you ever been diagnosed with sleep apnea through a sleep study? No  Do you snore loudly (loud enough to be heard through closed doors)?  1  Do you often feel tired, fatigued, or sleepy during the daytime? 0  Has anyone observed you stop breathing during your sleep? 1  Do you have, or are you being treated for high blood pressure? 1  BMI more than 35 kg/m2? 1  Age over 50 years old? 1  Neck circumference greater than 40 cm/18 inches? 1  Obstructive Sleep Apnea Score 6

## 2013-02-02 NOTE — Pre-Procedure Instructions (Signed)
Nina Pitts  02/02/2013   Your procedure is scheduled on:  Thursday February 03, 2013  Report to [facility] at 1215 PM.  Call this number if you have problems the morning of surgery: [phone]   Remember:   Do not eat food or drink liquids after midnight.   Take these medicines the morning of surgery with A SIP OF WATER: Synthroid, Ativan if needed for anxiety, and Prilosec   Do not wear jewelry, make-up or nail polish.  Do not wear lotions, powders, or perfumes. You may wear deodorant.  Do not shave 48 hours prior to surgery.  Do not bring valuables to the hospital.  [facility] is not responsible for any belongings or valuables.  Contacts, dentures or bridgework may not be worn into surgery.  Leave suitcase in the car. After surgery it may be brought to your room.  For patients admitted to the hospital, checkout time is 11:00 AM the day of discharge.   Patients discharged the day of surgery will not be allowed to drive home.    Special Instructions: Shower using CHG 2 nights before surgery and the night before surgery.  If you shower the day of surgery use CHG.  Use special wash - you have one bottle of CHG for all showers.  You should use approximately 1/3 of the bottle for each shower.   Please read over the following fact sheets that you were given: Pain Booklet, Coughing and Deep Breathing and Surgical Site Infection Prevention

## 2013-02-03 ENCOUNTER — Encounter (HOSPITAL_COMMUNITY): Payer: Self-pay | Admitting: Anesthesiology

## 2013-02-03 ENCOUNTER — Ambulatory Visit (HOSPITAL_COMMUNITY): Payer: Medicare Other | Admitting: Anesthesiology

## 2013-02-03 ENCOUNTER — Encounter (HOSPITAL_COMMUNITY)
Admission: RE | Disposition: A | Discharge status: Home / Self Care | Payer: Self-pay | Source: Ambulatory Visit | Attending: General Surgery

## 2013-02-03 ENCOUNTER — Observation Stay (HOSPITAL_COMMUNITY)
Admission: RE | Admit: 2013-02-03 | Discharge: 2013-02-04 | Disposition: A | Discharge status: Home / Self Care | Payer: Medicare Other | Source: Ambulatory Visit | Attending: General Surgery | Admitting: General Surgery

## 2013-02-03 DIAGNOSIS — Z01812 Encounter for preprocedural laboratory examination: Secondary | ICD-10-CM | POA: Insufficient documentation

## 2013-02-03 DIAGNOSIS — K8010 Calculus of gallbladder with chronic cholecystitis without obstruction: Secondary | ICD-10-CM

## 2013-02-03 DIAGNOSIS — Z01810 Encounter for preprocedural cardiovascular examination: Secondary | ICD-10-CM | POA: Insufficient documentation

## 2013-02-03 DIAGNOSIS — Z01818 Encounter for other preprocedural examination: Secondary | ICD-10-CM | POA: Insufficient documentation

## 2013-02-03 DIAGNOSIS — K219 Gastro-esophageal reflux disease without esophagitis: Secondary | ICD-10-CM | POA: Insufficient documentation

## 2013-02-03 DIAGNOSIS — Z79899 Other long term (current) drug therapy: Secondary | ICD-10-CM | POA: Insufficient documentation

## 2013-02-03 DIAGNOSIS — I10 Essential (primary) hypertension: Secondary | ICD-10-CM | POA: Insufficient documentation

## 2013-02-03 HISTORY — PX: CHOLECYSTECTOMY: SHX55

## 2013-02-03 HISTORY — DX: Fatty (change of) liver, not elsewhere classified: K76.0

## 2013-02-03 HISTORY — DX: Gout, unspecified: M10.9

## 2013-02-03 HISTORY — DX: Post-traumatic stress disorder, unspecified: F43.10

## 2013-02-03 HISTORY — DX: Bilateral primary osteoarthritis of knee: M17.0

## 2013-02-03 SURGERY — LAPAROSCOPIC CHOLECYSTECTOMY WITH INTRAOPERATIVE CHOLANGIOGRAM
Anesthesia: General | Site: Abdomen | Wound class: Clean Contaminated

## 2013-02-03 MED ORDER — SODIUM CHLORIDE 0.9 % IV SOLN
INTRAVENOUS | Status: DC
  Administered 2013-02-04: 06:00:00 via INTRAVENOUS

## 2013-02-03 MED ORDER — DEXAMETHASONE SODIUM PHOSPHATE 4 MG/ML IJ SOLN
INTRAMUSCULAR | Status: DC | PRN
  Administered 2013-02-03: 4 mg via INTRAVENOUS

## 2013-02-03 MED ORDER — OXYCODONE HCL 5 MG PO TABS
5.0000 mg | ORAL_TABLET | ORAL | Status: DC | PRN
  Administered 2013-02-03 – 2013-02-04 (×4): 5 mg via ORAL
  Filled 2013-02-03 (×4): qty 1

## 2013-02-03 MED ORDER — HEMOSTATIC AGENTS (NO CHARGE) OPTIME
TOPICAL | Status: DC | PRN
  Administered 2013-02-03: 1 via TOPICAL

## 2013-02-03 MED ORDER — PANTOPRAZOLE SODIUM 40 MG PO TBEC
40.0000 mg | DELAYED_RELEASE_TABLET | Freq: Every day | ORAL | Status: DC
  Administered 2013-02-03 – 2013-02-04 (×2): 40 mg via ORAL
  Filled 2013-02-03 (×2): qty 1

## 2013-02-03 MED ORDER — HYDROMORPHONE HCL PF 1 MG/ML IJ SOLN
INTRAMUSCULAR | Status: DC | PRN
  Administered 2013-02-03: 0.5 mg via INTRAVENOUS

## 2013-02-03 MED ORDER — 0.9 % SODIUM CHLORIDE (POUR BTL) OPTIME
TOPICAL | Status: DC | PRN
  Administered 2013-02-03: 1000 mL

## 2013-02-03 MED ORDER — LIDOCAINE HCL (CARDIAC) 20 MG/ML IV SOLN
INTRAVENOUS | Status: DC | PRN
  Administered 2013-02-03: 100 mg via INTRAVENOUS

## 2013-02-03 MED ORDER — ONDANSETRON HCL 4 MG/2ML IJ SOLN: 4.0000 mg | Freq: Once | INTRAMUSCULAR | Status: DC | PRN

## 2013-02-03 MED ORDER — BUPIVACAINE-EPINEPHRINE PF 0.25-1:200000 % IJ SOLN
INTRAMUSCULAR | Status: AC
  Filled 2013-02-03: qty 30

## 2013-02-03 MED ORDER — GLYCOPYRROLATE 0.2 MG/ML IJ SOLN
INTRAMUSCULAR | Status: DC | PRN
  Administered 2013-02-03: .6 mg via INTRAVENOUS

## 2013-02-03 MED ORDER — ACETAMINOPHEN 650 MG RE SUPP: 650.0000 mg | Freq: Four times a day (QID) | RECTAL | Status: DC | PRN

## 2013-02-03 MED ORDER — LORAZEPAM 0.5 MG PO TABS: 0.5000 mg | ORAL_TABLET | Freq: Two times a day (BID) | ORAL | Status: DC | PRN

## 2013-02-03 MED ORDER — FENTANYL CITRATE 0.05 MG/ML IJ SOLN
INTRAMUSCULAR | Status: DC | PRN
  Administered 2013-02-03: 100 ug via INTRAVENOUS
  Administered 2013-02-03: 50 ug via INTRAVENOUS
  Administered 2013-02-03: 100 ug via INTRAVENOUS

## 2013-02-03 MED ORDER — LEVOTHYROXINE SODIUM 200 MCG PO TABS
200.0000 ug | ORAL_TABLET | Freq: Every day | ORAL | Status: DC
  Administered 2013-02-04: 200 ug via ORAL
  Filled 2013-02-03 (×2): qty 1

## 2013-02-03 MED ORDER — LACTATED RINGERS IV SOLN
INTRAVENOUS | Status: DC
  Administered 2013-02-03: 13:00:00 via INTRAVENOUS

## 2013-02-03 MED ORDER — BUPIVACAINE-EPINEPHRINE 0.25% -1:200000 IJ SOLN
INTRAMUSCULAR | Status: DC | PRN
  Administered 2013-02-03: 30 mL

## 2013-02-03 MED ORDER — SODIUM CHLORIDE 0.9 % IR SOLN
Status: DC | PRN
  Administered 2013-02-03: 1000 mL

## 2013-02-03 MED ORDER — SODIUM CHLORIDE 0.9 % IV SOLN
INTRAVENOUS | Status: DC | PRN
  Administered 2013-02-03: 14:00:00

## 2013-02-03 MED ORDER — PROPOFOL 10 MG/ML IV BOLUS
INTRAVENOUS | Status: DC | PRN
  Administered 2013-02-03: 200 mg via INTRAVENOUS

## 2013-02-03 MED ORDER — HYDROMORPHONE HCL PF 1 MG/ML IJ SOLN: 0.2500 mg | INTRAMUSCULAR | Status: DC | PRN

## 2013-02-03 MED ORDER — MIDAZOLAM HCL 5 MG/5ML IJ SOLN
INTRAMUSCULAR | Status: DC | PRN
  Administered 2013-02-03: 2 mg via INTRAVENOUS

## 2013-02-03 MED ORDER — MORPHINE SULFATE 2 MG/ML IJ SOLN
2.0000 mg | INTRAMUSCULAR | Status: DC | PRN
  Administered 2013-02-03: 2 mg via INTRAVENOUS
  Filled 2013-02-03: qty 1

## 2013-02-03 MED ORDER — ONDANSETRON HCL 4 MG/2ML IJ SOLN
INTRAMUSCULAR | Status: DC | PRN
  Administered 2013-02-03: 4 mg via INTRAVENOUS

## 2013-02-03 MED ORDER — ONDANSETRON HCL 4 MG/2ML IJ SOLN: 4.0000 mg | Freq: Four times a day (QID) | INTRAMUSCULAR | Status: DC | PRN

## 2013-02-03 MED ORDER — LOSARTAN POTASSIUM 50 MG PO TABS
50.0000 mg | ORAL_TABLET | Freq: Every day | ORAL | Status: DC
  Administered 2013-02-03 – 2013-02-04 (×2): 50 mg via ORAL
  Filled 2013-02-03 (×3): qty 1

## 2013-02-03 MED ORDER — NEOSTIGMINE METHYLSULFATE 1 MG/ML IJ SOLN
INTRAMUSCULAR | Status: DC | PRN
  Administered 2013-02-03: 4 mg via INTRAVENOUS

## 2013-02-03 MED ORDER — ACETAMINOPHEN 325 MG PO TABS: 650.0000 mg | ORAL_TABLET | Freq: Four times a day (QID) | ORAL | Status: DC | PRN

## 2013-02-03 MED ORDER — ROCURONIUM BROMIDE 100 MG/10ML IV SOLN
INTRAVENOUS | Status: DC | PRN
  Administered 2013-02-03: 15 mg via INTRAVENOUS
  Administered 2013-02-03: 35 mg via INTRAVENOUS

## 2013-02-03 SURGICAL SUPPLY — 39 items
APPLIER CLIP 5 13 M/L LIGAMAX5 (MISCELLANEOUS) ×2
BLADE SURG ROTATE 9660 (MISCELLANEOUS) IMPLANT
CANISTER SUCTION 2500CC (MISCELLANEOUS) ×2 IMPLANT
CHLORAPREP W/TINT 26ML (MISCELLANEOUS) ×2 IMPLANT
CLIP APPLIE 5 13 M/L LIGAMAX5 (MISCELLANEOUS) ×1 IMPLANT
CLOTH BEACON ORANGE TIMEOUT ST (SAFETY) ×2 IMPLANT
COVER MAYO STAND STRL (DRAPES) ×2 IMPLANT
COVER SURGICAL LIGHT HANDLE (MISCELLANEOUS) ×2 IMPLANT
DECANTER SPIKE VIAL GLASS SM (MISCELLANEOUS) ×4 IMPLANT
DERMABOND ADVANCED (GAUZE/BANDAGES/DRESSINGS) ×1
DERMABOND ADVANCED .7 DNX12 (GAUZE/BANDAGES/DRESSINGS) ×1 IMPLANT
DRAPE C-ARM 42X72 X-RAY (DRAPES) ×2 IMPLANT
ELECT REM PT RETURN 9FT ADLT (ELECTROSURGICAL) ×2
ELECTRODE REM PT RTRN 9FT ADLT (ELECTROSURGICAL) ×1 IMPLANT
GLOVE BIO SURGEON STRL SZ7 (GLOVE) ×4 IMPLANT
GLOVE BIO SURGEON STRL SZ7.5 (GLOVE) ×2 IMPLANT
GLOVE BIOGEL PI IND STRL 7.0 (GLOVE) ×1 IMPLANT
GLOVE BIOGEL PI IND STRL 7.5 (GLOVE) ×2 IMPLANT
GLOVE BIOGEL PI INDICATOR 7.0 (GLOVE) ×1
GLOVE BIOGEL PI INDICATOR 7.5 (GLOVE) ×2
GLOVE SURG SS PI 7.0 STRL IVOR (GLOVE) ×2 IMPLANT
GOWN STRL NON-REIN LRG LVL3 (GOWN DISPOSABLE) ×6 IMPLANT
HEMOSTAT SNOW SURGICEL 2X4 (HEMOSTASIS) ×2 IMPLANT
KIT BASIN OR (CUSTOM PROCEDURE TRAY) ×2 IMPLANT
KIT ROOM TURNOVER OR (KITS) ×2 IMPLANT
NS IRRIG 1000ML POUR BTL (IV SOLUTION) ×2 IMPLANT
PAD ARMBOARD 7.5X6 YLW CONV (MISCELLANEOUS) ×2 IMPLANT
POUCH SPECIMEN RETRIEVAL 10MM (ENDOMECHANICALS) ×2 IMPLANT
SCISSORS LAP 5X35 DISP (ENDOMECHANICALS) IMPLANT
SET CHOLANGIOGRAPH 5 50 .035 (SET/KITS/TRAYS/PACK) ×2 IMPLANT
SET IRRIG TUBING LAPAROSCOPIC (IRRIGATION / IRRIGATOR) ×2 IMPLANT
SLEEVE ENDOPATH XCEL 5M (ENDOMECHANICALS) ×4 IMPLANT
SPECIMEN JAR SMALL (MISCELLANEOUS) ×2 IMPLANT
SUT MNCRL AB 4-0 PS2 18 (SUTURE) ×2 IMPLANT
TOWEL OR 17X24 6PK STRL BLUE (TOWEL DISPOSABLE) ×2 IMPLANT
TOWEL OR 17X26 10 PK STRL BLUE (TOWEL DISPOSABLE) ×2 IMPLANT
TRAY LAPAROSCOPIC (CUSTOM PROCEDURE TRAY) ×2 IMPLANT
TROCAR XCEL BLUNT TIP 100MML (ENDOMECHANICALS) ×2 IMPLANT
TROCAR XCEL NON-BLD 5MMX100MML (ENDOMECHANICALS) ×2 IMPLANT

## 2013-02-03 NOTE — Anesthesia Preprocedure Evaluation (Addendum)
Anesthesia Evaluation  Patient identified by MRN, date of birth, ID band Patient awake    Reviewed: Allergy & Precautions, H&P , NPO status , Patient's Chart, lab work & pertinent test results  Airway Mallampati: I TM Distance: >3 FB Neck ROM: full    Dental  (+) Teeth Intact and Dental Advisory Given   Pulmonary neg pulmonary ROS, Current Smoker,          Cardiovascular hypertension, Pt. on medications Rhythm:regular Rate:Normal     Neuro/Psych PSYCHIATRIC DISORDERS Anxiety  Neuromuscular disease    GI/Hepatic Neg liver ROS, GERD-  Medicated and Controlled,Gallstones    Endo/Other  Hypothyroidism Morbid obesity  Renal/GU negative Renal ROS     Musculoskeletal  (+) Fibromyalgia -  Abdominal (+) + obese,   Peds  Hematology   Anesthesia Other Findings   Reproductive/Obstetrics                       Anesthesia Physical Anesthesia Plan  ASA: II  Anesthesia Plan: General   Post-op Pain Management:    Induction: Intravenous  Airway Management Planned: Oral ETT  Additional Equipment:   Intra-op Plan:   Post-operative Plan: Extubation in OR  Informed Consent: I have reviewed the patients History and Physical, chart, labs and discussed the procedure including the risks, benefits and alternatives for the proposed anesthesia with the patient or authorized representative who has indicated his/her understanding and acceptance.     Plan Discussed with: CRNA, Anesthesiologist and Surgeon  Anesthesia Plan Comments:         Anesthesia Quick Evaluation

## 2013-02-03 NOTE — Interval H&P Note (Signed)
History and Physical Interval Note:  02/03/2013 2:13 PM  Nina Pitts  has presented today for surgery, with the diagnosis of symptomatic cholelithiasis  The various methods of treatment have been discussed with the patient and family. After consideration of risks, benefits and other options for treatment, the patient has consented to  Procedure(s): LAPAROSCOPIC CHOLECYSTECTOMY WITH INTRAOPERATIVE CHOLANGIOGRAM (N/A) as a surgical intervention .  The patient's history has been reviewed, patient examined, no change in status, stable for surgery.  I have reviewed the patient's chart and labs.  Questions were answered to the patient's satisfaction.     Mehul Rudin

## 2013-02-03 NOTE — Anesthesia Postprocedure Evaluation (Signed)
  Anesthesia Post-op Note  Patient: Nina Pitts  Procedure(s) Performed: Procedure(s): LAPAROSCOPIC CHOLECYSTECTOMY WITH INTRAOPERATIVE CHOLANGIOGRAM (N/A)  Patient Location: PACU  Anesthesia Type:General  Level of Consciousness: awake, alert , oriented and patient cooperative  Airway and Oxygen Therapy: Patient Spontanous Breathing and Patient connected to nasal cannula oxygen  Post-op Pain: mild  Post-op Assessment: Post-op Vital signs reviewed, Patient's Cardiovascular Status Stable, Respiratory Function Stable, Patent Airway, No signs of Nausea or vomiting and Pain level controlled  Post-op Vital Signs: stable  Complications: No apparent anesthesia complications

## 2013-02-03 NOTE — Anesthesia Procedure Notes (Signed)
Procedure Name: Intubation Date/Time: 02/03/2013 2:23 PM Performed by: Leona Singleton A Pre-anesthesia Checklist: Patient identified, Emergency Drugs available, Suction available and Patient being monitored Patient Re-evaluated:Patient Re-evaluated prior to inductionOxygen Delivery Method: Circle system utilized Preoxygenation: Pre-oxygenation with 100% oxygen Intubation Type: IV induction Ventilation: Mask ventilation without difficulty Laryngoscope Size: Miller and 2 Grade View: Grade I Tube type: Oral Tube size: 7.0 mm Number of attempts: 1 Airway Equipment and Method: Stylet Placement Confirmation: ETT inserted through vocal cords under direct vision,  positive ETCO2 and breath sounds checked- equal and bilateral Secured at: 21 cm Tube secured with: Tape Dental Injury: Teeth and Oropharynx as per pre-operative assessment

## 2013-02-03 NOTE — H&P (View-Only) (Signed)
Patient ID: Nina Pitts, female   DOB: Feb 02, 1961, 52 y.o.   MRN: [id number]  Chief Complaint  Patient presents with  . New Evaluation    eval cholelithiasis    HPI Nina Pitts is a 52 y.o. female.  Referred by Dr Vonna Kotyk Pyrtle HPI 87 yom with history of elevated lfts and some ruq/epigastric abdominal pain.  She has been having ruq and epigastric pain that has been increasing in frequency.  She notes some flushing and some nausea.  No emesis.  There is some relation to food.  She has some heartburn associated with this that is controlled fairly well with PPIs.  This is not helping the pain though.  She has no dysphagia.  No real changes in her bms since this pain has started.  Nothin really helps when she has this pain.  She has undergone u/s that shows her to have cholelithiasis and presents today to have this evaluated.  Past Medical History  Diagnosis Date  . Anxiety   . Arthritis   . Fibromyalgia   . Gallstones   . Hypertension   . Thyroid disease   . IBS (irritable bowel syndrome)   . Obesity   . UTI (lower urinary tract infection)   . GERD (gastroesophageal reflux disease)     Past Surgical History  Procedure Laterality Date  . Cesarean section    . Appendectomy    . Abdominal hysterectomy    . Hernia repair      [facility] at age 18  . Axillary hidradenitis excision      bilateral with stsg  . Pilonidal cyst excision      Family History  Problem Relation Age of Onset  . Pancreatic cancer Father   . Esophageal cancer Father   . Cancer Father     esophagus, lungs  . Diabetes Mother   . Heart disease Mother   . Cancer Paternal Grandmother     breast    Social History History  Substance Use Topics  . Smoking status: Former Smoker    Quit date: 11/18/1998  . Smokeless tobacco: Never Used  . Alcohol Use: No    Allergies  Allergen Reactions  . Aspirin Nausea And Vomiting    Adult strength only  . Robaxin [Methocarbamol] Itching  . Vicodin  [Hydrocodone-Acetaminophen] Itching    Current Outpatient Prescriptions  Medication Sig Dispense Refill  . aspirin 81 MG chewable tablet Chew 81 mg by mouth daily.      Marland Kitchen levothyroxine (SYNTHROID, LEVOTHROID) 200 MCG tablet Take 200 mcg by mouth daily before breakfast.      . LORazepam (ATIVAN) 0.5 MG tablet Take 0.5 mg by mouth every 8 (eight) hours.      Marland Kitchen losartan (COZAAR) 50 MG tablet Take 50 mg by mouth daily.      . Multiple Vitamin (MULTIVITAMIN) tablet Take 1 tablet by mouth daily.      Marland Kitchen omeprazole (PRILOSEC) 20 MG capsule Take 1 capsule (20 mg total) by mouth daily.  30 capsule  6  . Probiotic Product (ALIGN) 4 MG CAPS Take 1 capsule by mouth daily.      . simethicone (MYLICON) 125 MG chewable tablet Chew 125 mg by mouth every 6 (six) hours as needed for flatulence. Take 2 daily and at bedtime as needed       No current facility-administered medications for this visit.    Review of Systems Review of Systems  Constitutional: Negative for fever, chills and unexpected weight change.  HENT: Positive for sore throat and trouble swallowing. Negative for hearing loss, congestion and voice change.   Eyes: Negative for visual disturbance.  Respiratory: Negative for cough and wheezing.   Cardiovascular: Negative for chest pain, palpitations and leg swelling.  Gastrointestinal: Positive for nausea and diarrhea. Negative for vomiting, abdominal pain, constipation, blood in stool, abdominal distention and anal bleeding.  Genitourinary: Negative for hematuria, vaginal bleeding and difficulty urinating.  Musculoskeletal: Positive for arthralgias.  Skin: Negative for rash and wound.  Neurological: Negative for seizures, syncope and headaches.  Hematological: Negative for adenopathy. Does not bruise/bleed easily.  Psychiatric/Behavioral: Negative for confusion.    Blood pressure 124/74, pulse 64, temperature 97.7 F (36.5 C), temperature source Temporal, resp. rate 14, height 5\' 4"  (1.626  m), weight 226 lb 9.6 oz (102.785 kg).  Physical Exam Physical Exam  Vitals reviewed. Constitutional: She appears well-developed and well-nourished.  Eyes: No scleral icterus.  Neck: Neck supple.  Cardiovascular: Normal rate, regular rhythm and normal heart sounds.   Pulmonary/Chest: Effort normal and breath sounds normal. She has no wheezes. She has no rales.  Abdominal: Soft. Bowel sounds are normal. She exhibits no distension. There is tenderness in the right upper quadrant and epigastric area. No hernia.  Lymphadenopathy:    She has no cervical adenopathy.    Data Reviewed ULTRASOUND ABDOMEN:  Technique: Sonography of upper abdominal structures was performed.  Image quality degraded due to body habitus and bowel gas.  Comparison: CT abdomen and pelvis 10/30/2004  Gallbladder: Multiple shadowing calculi within the partially  contracted gallbladder. No gallbladder wall thickening,  pericholecystic fluid or sonographic Murphy sign.  Common bile duct: Upper normal caliber 6 mm diameter  Liver: Echogenic, likely fatty infiltration, though this can be  seen with cirrhosis and certain infiltrative disorders. No  definite focal hepatic mass or nodularity. Suboptimal  visualization of intrahepatic detail due to poor sound transmission  through echogenic parenchyma. Hepatopetal portal venous flow.  IVC: Poorly visualized due to poor sound transmission through  liver  Pancreas: Poorly visualized due to the bowel gas  Spleen: Normal appearance, 5.4 cm length  Right kidney: 11.9 cm length. Normal morphology without mass or  hydronephrosis.  Left kidney: 10.8 cm length. Normal morphology without mass or  hydronephrosis.  Aorta: Normal caliber  Other: No free fluid  IMPRESSION:  Cholelithiasis without evidence of acute cholecystitis.  Probable fatty infiltration of liver.  Inadequate visualization of IVC and pancreas   Assessment    GERD Symptomatic cholelithiasis    Plan    Lap chole with IOC, will check lfts again preop I do think some of her symptoms are related to her gallstones.  We discussed she will get better to some degree after surgery but may not be 100% I discussed the procedure in detail.   We discussed the risks and benefits of a laparoscopic cholecystectomy and possible cholangiogram including, but not limited to bleeding, infection, injury to surrounding structures such as the intestine or liver, bile leak, retained gallstones, need to convert to an open procedure, prolonged diarrhea, blood clots such as  DVT, common bile duct injury, anesthesia risks, and possible need for additional procedures.  The likelihood of improvement in symptoms and return to the patient's normal status is good. We discussed the typical post-operative recovery course.         Hubert Raatz 01/31/2013, 9:42 AM

## 2013-02-03 NOTE — Preoperative (Signed)
Beta Blockers   Reason not to administer Beta Blockers:Not Applicable 

## 2013-02-03 NOTE — Transfer of Care (Signed)
Immediate Anesthesia Transfer of Care Note  Patient: Nina Pitts  Procedure(s) Performed: Procedure(s): LAPAROSCOPIC CHOLECYSTECTOMY WITH INTRAOPERATIVE CHOLANGIOGRAM (N/A)  Patient Location: PACU  Anesthesia Type:General  Level of Consciousness: awake, alert , oriented and patient cooperative  Airway & Oxygen Therapy: Patient Spontanous Breathing and Patient connected to nasal cannula oxygen  Post-op Assessment: Report given to PACU RN and Post -op Vital signs reviewed and stable  Post vital signs: Reviewed and stable  Complications: No apparent anesthesia complications

## 2013-02-03 NOTE — Op Note (Signed)
Preoperative diagnosis: Symptomatic cholelithiasis Postoperative diagnosis: same as above, chronic cholecystitis Procedure: Laparoscopic cholecystectomy Surgeon: Dr Harden Mo Assistant: Dr Marcille Blanco Anesthesia: Gen. Estimated blood loss: Minimal Specimens: Gallbladder and contents to pathology Drains: None Complications: None Sponge and needle count correct at completion Disposition to recovery stable  Indications: This 52 year old female with known reflux disease who presents with right upper quadrant epigastric pain that is not responsive to proton pump inhibitor. She has undergone an ultrasound that shows gallstones. It fairly clearly sounded like some of her symptoms are certainly related to her gallbladder. I discussed a laparoscopic cholecystectomy with her. Her liver function tests were normal prior to the surgery.  Procedure: After informed consent was obtained the patient was taken to the operating room. She was administered 1 g of cefoxitin. Sequential compression devices were on her legs. She was placed under general anesthesia without complication. Her abdomen was prepped and draped in the standard sterile surgical fashion. Surgical timeout was performed.  I elected to go in a supraumbilical position due to her prior surgeries. I infiltrated Marcaine throughout this area. I made a vertical incision. I grasped her fascia with Kocher clamps. I entered the fascia sharply and the peritoneum bluntly. She had some omentum that was adherent that I swept away. I then placed a 0 Vicryl pursestring suture to the fascia. I inserted a Hassan trocar and insufflated the abdomen to 15 mm mercury pressure. I then placed 3 further 5 mm trocars in the epigastrium in the right upper quadrant under direct vision without complication. The gallbladder was then retracted cephalad. Her duodenum and omentum were very adherent and these were taken down with a combination of blunt dissection and cautery.  Eventually I was able to retract the gallbladder cephalad and lateral. It to be some time just to be able to get to the scarred in tissue from her chronic disease to be able to identify both the gallbladder as well as the triangle structures. Eventually I was able to very clearly obtain the critical view of safety. I then divided the artery after placing clips. I then clipped the duct and divided this as well. I removed the gallbladder from the liver bed. This was placed in an Endo Catch bag and removed from the umbilicus. Hemostasis was obtained. I did place a piece of Surgicel snow in the liver bed. The irrigant was clear. I viewed my entry site and there was no evidence of injury. I removed my Hassan trocar. I tied the stitch down that completely obliterated the defect. I then removed my remaining trocars. I closed these with 4-0 Monocryl and Dermabond. She tolerated this well was extubated and transferred to recovery stable.

## 2013-02-04 ENCOUNTER — Encounter (HOSPITAL_COMMUNITY): Payer: Self-pay | Admitting: General Surgery

## 2013-02-04 MED ORDER — OXYCODONE HCL 5 MG PO TABS: 5.0000 mg | ORAL_TABLET | ORAL | Status: DC | PRN

## 2013-02-04 NOTE — Discharge Summary (Signed)
Physician Discharge Summary  Patient ID: Nina Pitts MRN: [id number] DOB/AGE: 1960/11/28 52 y.o.  Admit date: 02/03/2013 Discharge date: 02/04/2013  Admission Diagnoses: Chronic cholecystitis Discharge Diagnoses:  Active Problems:   * No active hospital problems. *   Discharged Condition: good  Hospital Course: 21 yof underwent lap chole for what appears to be chronic calculous cholecystitis.  Gallbladder was very scarred and had evidence of symptoms.  She did well.  The following day she was tolerating diet, voiding and pain controlled.  Will be discharged home  Consults: None  Significant Diagnostic Studies: none   Treatments: surgery: lap chole  Discharge Exam: Blood pressure 123/70, pulse 60, temperature 98.8 F (37.1 C), temperature source Oral, resp. rate 16, height 5\' 4"  (1.626 m), weight 247 lb 3.2 oz (112.129 kg), SpO2 92.00%. General appearance: no distress GI: approp tender, wounds clean without infection  Disposition: 01-Home or Self Care   Future Appointments Provider Department Dept Phone   02/18/2013 11:20 AM [name], MD [facility], [place] [phone]       Medication List         ALIGN 4 MG Caps  Take 1 capsule by mouth daily.     aspirin 81 MG chewable tablet  Chew 81 mg by mouth daily.     GAS-X PO  Take 1 tablet by mouth every 8 (eight) hours as needed. For gas     levothyroxine 200 MCG tablet  Commonly known as:  SYNTHROID, LEVOTHROID  Take 200 mcg by mouth daily before breakfast.     LORazepam 0.5 MG tablet  Commonly known as:  ATIVAN  Take 0.5 mg by mouth 2 (two) times daily as needed for anxiety.     losartan 50 MG tablet  Commonly known as:  COZAAR  Take 50 mg by mouth daily.     metaxalone 800 MG tablet  Commonly known as:  SKELAXIN  Take 800 mg by mouth 3 (three) times daily.     multivitamin tablet  Take 1 tablet by mouth daily.     omeprazole 20 MG capsule  Commonly known as:  PRILOSEC  Take 1  capsule (20 mg total) by mouth daily.     oxyCODONE 5 MG immediate release tablet  Commonly known as:  Oxy IR/ROXICODONE  Take 1 tablet (5 mg total) by mouth every 4 (four) hours as needed.           Follow-up Information   Follow up with [facility], MD In 3 weeks.   Specialty:  General Surgery   Contact information:   [address] [place] [address] [phone]       Signed: [name] 02/04/2013, 9:04 AM

## 2013-02-04 NOTE — Discharge Planning (Signed)
Discharged per w/c home with all personal belongings, acomp by husband. Pt has copy of home instructions and rx.and verbalizes understanding

## 2013-02-07 ENCOUNTER — Encounter (HOSPITAL_COMMUNITY): Payer: Self-pay | Admitting: Emergency Medicine

## 2013-02-07 DIAGNOSIS — K7689 Other specified diseases of liver: Secondary | ICD-10-CM | POA: Insufficient documentation

## 2013-02-07 DIAGNOSIS — Z9089 Acquired absence of other organs: Secondary | ICD-10-CM | POA: Insufficient documentation

## 2013-02-07 DIAGNOSIS — E039 Hypothyroidism, unspecified: Secondary | ICD-10-CM | POA: Insufficient documentation

## 2013-02-07 DIAGNOSIS — IMO0001 Reserved for inherently not codable concepts without codable children: Secondary | ICD-10-CM | POA: Insufficient documentation

## 2013-02-07 DIAGNOSIS — F4310 Post-traumatic stress disorder, unspecified: Secondary | ICD-10-CM | POA: Insufficient documentation

## 2013-02-07 DIAGNOSIS — I10 Essential (primary) hypertension: Secondary | ICD-10-CM | POA: Insufficient documentation

## 2013-02-07 DIAGNOSIS — K839 Disease of biliary tract, unspecified: Secondary | ICD-10-CM | POA: Insufficient documentation

## 2013-02-07 DIAGNOSIS — K589 Irritable bowel syndrome without diarrhea: Secondary | ICD-10-CM | POA: Insufficient documentation

## 2013-02-07 DIAGNOSIS — M129 Arthropathy, unspecified: Secondary | ICD-10-CM | POA: Insufficient documentation

## 2013-02-07 DIAGNOSIS — R109 Unspecified abdominal pain: Principal | ICD-10-CM | POA: Insufficient documentation

## 2013-02-07 DIAGNOSIS — R5082 Postprocedural fever: Secondary | ICD-10-CM | POA: Insufficient documentation

## 2013-02-07 DIAGNOSIS — F411 Generalized anxiety disorder: Secondary | ICD-10-CM | POA: Insufficient documentation

## 2013-02-07 DIAGNOSIS — M109 Gout, unspecified: Secondary | ICD-10-CM | POA: Insufficient documentation

## 2013-02-07 LAB — CBC WITH DIFFERENTIAL/PLATELET
Basophils Absolute: 0 10*3/uL (ref 0.0–0.1)
Eosinophils Relative: 3 % (ref 0–5)
Lymphocytes Relative: 29 % (ref 12–46)
MCV: 92.1 fL (ref 78.0–100.0)
Neutrophils Relative %: 61 % (ref 43–77)
Platelets: 287 10*3/uL (ref 150–400)
RBC: 4.31 MIL/uL (ref 3.87–5.11)
RDW: 13.2 % (ref 11.5–15.5)
WBC: 9.7 10*3/uL (ref 4.0–10.5)

## 2013-02-07 LAB — COMPREHENSIVE METABOLIC PANEL
ALT: 40 U/L — ABNORMAL HIGH (ref 0–35)
AST: 17 U/L (ref 0–37)
Alkaline Phosphatase: 90 U/L (ref 39–117)
CO2: 30 mEq/L (ref 19–32)
Calcium: 9.3 mg/dL (ref 8.4–10.5)
GFR calc non Af Amer: >90 mL/min (ref >90)
Potassium: 4 mEq/L (ref 3.5–5.1)
Sodium: 137 mEq/L (ref 135–145)
Total Protein: 7.5 g/dL (ref 6.0–8.3)

## 2013-02-07 LAB — URINALYSIS, ROUTINE W REFLEX MICROSCOPIC
Hgb urine dipstick: NEGATIVE
Leukocytes, UA: NEGATIVE
Nitrite: NEGATIVE
Protein, ur: NEGATIVE mg/dL
Specific Gravity, Urine: 1.013 (ref 1.005–1.030)
Urobilinogen, UA: 1 mg/dL (ref 0.0–1.0)

## 2013-02-07 NOTE — ED Notes (Signed)
PT. REPORTS FEVER AT HOME THIS EVENING = 101.0 , PT. STATED S/P CHOLECYSTECTOMY LAST 02/03/2013 BY DR. Dwain Sarna .

## 2013-02-08 ENCOUNTER — Observation Stay (HOSPITAL_COMMUNITY): Payer: Medicare Other

## 2013-02-08 ENCOUNTER — Emergency Department (HOSPITAL_COMMUNITY): Payer: Medicare Other

## 2013-02-08 ENCOUNTER — Observation Stay (HOSPITAL_COMMUNITY)
Admission: EM | Admit: 2013-02-08 | Discharge: 2013-02-11 | Disposition: A | Discharge status: Home / Self Care | Payer: Medicare Other | Attending: General Surgery | Admitting: General Surgery

## 2013-02-08 ENCOUNTER — Encounter (HOSPITAL_COMMUNITY): Payer: Self-pay | Admitting: General Surgery

## 2013-02-08 DIAGNOSIS — R509 Fever, unspecified: Secondary | ICD-10-CM | POA: Diagnosis not present

## 2013-02-08 DIAGNOSIS — R109 Unspecified abdominal pain: Secondary | ICD-10-CM | POA: Diagnosis present

## 2013-02-08 DIAGNOSIS — R5082 Postprocedural fever: Secondary | ICD-10-CM

## 2013-02-08 LAB — CBC
Hemoglobin: 13.9 g/dL (ref 12.0–15.0)
MCH: 31.2 pg (ref 26.0–34.0)
MCHC: 34.1 g/dL (ref 30.0–36.0)
RDW: 13.1 % (ref 11.5–15.5)

## 2013-02-08 LAB — CREATININE, SERUM: Creatinine, Ser: 0.46 mg/dL — ABNORMAL LOW (ref 0.50–1.10)

## 2013-02-08 MED ORDER — LEVOTHYROXINE SODIUM 200 MCG PO TABS
200.0000 ug | ORAL_TABLET | Freq: Every day | ORAL | Status: DC
  Administered 2013-02-10 – 2013-02-11 (×2): 200 ug via ORAL
  Filled 2013-02-08 (×5): qty 1

## 2013-02-08 MED ORDER — MORPHINE SULFATE 4 MG/ML IJ SOLN
INTRAMUSCULAR | Status: AC
  Administered 2013-02-08: 4 mg via INTRAVENOUS
  Filled 2013-02-08: qty 1

## 2013-02-08 MED ORDER — PANTOPRAZOLE SODIUM 40 MG PO TBEC
40.0000 mg | DELAYED_RELEASE_TABLET | Freq: Every day | ORAL | Status: DC
  Administered 2013-02-09 – 2013-02-11 (×3): 40 mg via ORAL
  Filled 2013-02-08 (×5): qty 1

## 2013-02-08 MED ORDER — MORPHINE SULFATE 4 MG/ML IJ SOLN
4.0000 mg | Freq: Once | INTRAMUSCULAR | Status: AC
  Administered 2013-02-08: 4 mg via INTRAVENOUS
  Filled 2013-02-08: qty 1

## 2013-02-08 MED ORDER — SODIUM CHLORIDE 0.9 % IV BOLUS (SEPSIS)
1000.0000 mL | Freq: Once | INTRAVENOUS | Status: AC
  Administered 2013-02-08: 1000 mL via INTRAVENOUS

## 2013-02-08 MED ORDER — IOHEXOL 300 MG/ML  SOLN
25.0000 mL | INTRAMUSCULAR | Status: DC
  Administered 2013-02-08: 25 mL via ORAL

## 2013-02-08 MED ORDER — MORPHINE SULFATE 2 MG/ML IJ SOLN
2.0000 mg | INTRAMUSCULAR | Status: DC | PRN
  Administered 2013-02-08 – 2013-02-09 (×3): 2 mg via INTRAVENOUS
  Filled 2013-02-08 (×3): qty 1

## 2013-02-08 MED ORDER — IOHEXOL 300 MG/ML  SOLN
100.0000 mL | Freq: Once | INTRAMUSCULAR | Status: AC | PRN
  Administered 2013-02-08: 100 mL via INTRAVENOUS

## 2013-02-08 MED ORDER — SIMETHICONE 80 MG PO CHEW
80.0000 mg | CHEWABLE_TABLET | Freq: Three times a day (TID) | ORAL | Status: DC | PRN
  Administered 2013-02-09 – 2013-02-10 (×3): 80 mg via ORAL
  Filled 2013-02-08 (×3): qty 1

## 2013-02-08 MED ORDER — LOSARTAN POTASSIUM 50 MG PO TABS
50.0000 mg | ORAL_TABLET | Freq: Every day | ORAL | Status: DC
  Administered 2013-02-09 – 2013-02-11 (×3): 50 mg via ORAL
  Filled 2013-02-08 (×7): qty 1

## 2013-02-08 MED ORDER — KCL IN DEXTROSE-NACL 20-5-0.45 MEQ/L-%-% IV SOLN
INTRAVENOUS | Status: DC
  Administered 2013-02-08 – 2013-02-10 (×4): via INTRAVENOUS
  Filled 2013-02-08 (×5): qty 1000

## 2013-02-08 MED ORDER — TECHNETIUM TC 99M MEBROFENIN IV KIT
5.0000 | PACK | Freq: Once | INTRAVENOUS | Status: AC | PRN
  Administered 2013-02-08: 5 via INTRAVENOUS

## 2013-02-08 MED ORDER — HEPARIN SODIUM (PORCINE) 5000 UNIT/ML IJ SOLN
5000.0000 [IU] | Freq: Three times a day (TID) | INTRAMUSCULAR | Status: DC
  Administered 2013-02-08 – 2013-02-11 (×6): 5000 [IU] via SUBCUTANEOUS
  Filled 2013-02-08 (×11): qty 1

## 2013-02-08 MED ORDER — ONDANSETRON HCL 4 MG/2ML IJ SOLN: 4.0000 mg | Freq: Four times a day (QID) | INTRAMUSCULAR | Status: DC | PRN

## 2013-02-08 MED ORDER — METAXALONE 800 MG PO TABS
800.0000 mg | ORAL_TABLET | Freq: Three times a day (TID) | ORAL | Status: DC | PRN
  Administered 2013-02-09 – 2013-02-10 (×2): 800 mg via ORAL
  Filled 2013-02-08 (×4): qty 1

## 2013-02-08 MED ORDER — LORAZEPAM 0.5 MG PO TABS
0.5000 mg | ORAL_TABLET | Freq: Two times a day (BID) | ORAL | Status: DC | PRN
  Administered 2013-02-09: 0.5 mg via ORAL
  Filled 2013-02-08: qty 1

## 2013-02-08 MED ORDER — ASPIRIN 81 MG PO CHEW
81.0000 mg | CHEWABLE_TABLET | Freq: Every day | ORAL | Status: DC
  Filled 2013-02-08: qty 1

## 2013-02-08 MED ORDER — MORPHINE SULFATE 4 MG/ML IJ SOLN: 4.0000 mg | Freq: Once | INTRAMUSCULAR | Status: AC

## 2013-02-08 NOTE — ED Notes (Signed)
Pt returned from CT with an increase in pain, and diaphoretic.

## 2013-02-08 NOTE — Consult Note (Signed)
Referring Provider: Harden Mo, MD Primary Care Physician:  Londell Moh, MD Primary Gastroenterologist:  None (unassigned)  Reason for Consultation:  Bile leak  HPI: Nina Pitts is a 52 y.o. female 5 days status post a difficult laparoscopic cholecystectomy, who developed low-grade fevers over the past day or 2 and then presented to the emergency room last night. She only began to have really severe pain after drinking oral contrast for her CT scan around 5:00 this morning. At that time, she developed severe right upper quadrant pain with some pain into her back, made worse by deep breathing. Her CT scan showed typical postoperative changes in the gallbladder fossa region. Because of the pain, however, she then went on to a hiatus scan, which showed a bile leak, with patency of the common bile duct. Accordingly, we were asked to see the patient.   Past Medical History  Diagnosis Date  . Anxiety   . Fibromyalgia   . Gallstones   . Hypertension   . IBS (irritable bowel syndrome)   . Obesity   . GERD (gastroesophageal reflux disease)   . Hypothyroidism   . Carpal tunnel syndrome   . Arthritis     "joints" (02/03/2013)  . Osteoarthritis of both knees   . Gout attack ~ 2011  . PTSD (post-traumatic stress disorder)   . Fatty liver disease, nonalcoholic dx'd "~ 09/979"    Past Surgical History  Procedure Laterality Date  . Cesarean section  1979; 1980; 1986  . [facility]ia [facility]  ~ [facility]   . Axillary hidradenitis excision Bilateral 1990's    with stsg  . Pilonidal cyst excision  2000?  Marland Kitchen Cholecystectomy  02/03/2013  . Knee arthroscopy Right 1990'S  . Abdominal exploration surgery  04/27/2001    w/LOA/notes 04/27/2001 (02/03/2013)  . Bilateral salpingoophorectomy Bilateral 04/27/2001    Shelisha Perch 04/27/2001 (02/03/2013)  . Appendectomy  ~ 1974  . Abdominal hysterectomy  ~ 2000    partial  . Cardiac catheterization  ~ 2005  . Cholecystectomy N/A 02/03/2013   Procedure: LAPAROSCOPIC CHOLECYSTECTOMY WITH INTRAOPERATIVE CHOLANGIOGRAM;  Surgeon: [name], MD;  Location: [facility] OR;  Service: General;  Laterality: N/A;    Prior to Admission medications   Medication Sig Start Date End Date Taking? Authorizing Provider  aspirin 81 MG chewable tablet Chew 81 mg by mouth daily.   Yes Historical Provider, MD  levothyroxine (SYNTHROID, LEVOTHROID) 200 MCG tablet Take 200 mcg by mouth daily before breakfast.   Yes Historical Provider, MD  LORazepam (ATIVAN) 0.5 MG tablet Take 0.5 mg by mouth 2 (two) times daily as needed for anxiety.    Yes Historical Provider, MD  losartan (COZAAR) 50 MG tablet Take 50 mg by mouth daily.   Yes Historical Provider, MD  metaxalone (SKELAXIN) 800 MG tablet Take 800 mg by mouth 3 (three) times daily as needed for pain.    Yes Historical Provider, MD  Multiple Vitamin (MULTIVITAMIN) tablet Take 1 tablet by mouth daily.   Yes Historical Provider, MD  omeprazole (PRILOSEC) 20 MG capsule Take 1 capsule (20 mg total) by mouth daily. 11/17/12  Yes [name], MD  oxyCODONE (OXY IR/ROXICODONE) 5 MG immediate release tablet Take 1 tablet (5 mg total) by mouth every 4 (four) hours as needed. 02/04/13  Yes [name], MD  Probiotic Product (ALIGN) 4 MG CAPS Take 1 capsule by mouth daily.   Yes Historical Provider, MD  Simethicone (GAS-X PO) Take 1 tablet by mouth every 8 (eight)  hours as needed. For gas   Yes Historical Provider, MD    Current Facility-Administered Medications  Medication Dose Route Frequency Provider Last Rate Last Dose  . dextrose 5 % and 0.45 % NaCl with KCl 20 mEq/L infusion   Intravenous Continuous Letha Cape, PA-C 75 mL/hr at 02/08/13 1153    . heparin injection 5,000 Units  5,000 Units Subcutaneous Q8H Letha Cape, PA-C      . [START ON 02/09/2013] levothyroxine (SYNTHROID, LEVOTHROID) tablet 200 mcg  200 mcg Oral QAC breakfast Letha Cape, PA-C      . LORazepam (ATIVAN) tablet 0.5 mg  0.5 mg  Oral BID PRN Letha Cape, PA-C      . losartan (COZAAR) tablet 50 mg  50 mg Oral Daily Letha Cape, PA-C      . metaxalone [facility]) tablet 800 mg  800 mg Oral TID PRN Letha Cape, PA-C      . ondansetron [facility]) injection 4 mg  4 mg Intravenous Q6H PRN Letha Cape, PA-C      . pantoprazole (PROTONIX) EC tablet 40 mg  40 mg Oral Daily Letha Cape, PA-C      . simethicone [facility]) chewable tablet 80 mg  80 mg Oral Q8H PRN Letha Cape, PA-C        Allergies as of 02/07/2013 - Review Complete 02/07/2013  Allergen Reaction Noted  . Aspirin Nausea And Vomiting 11/17/2012  . Robaxin [methocarbamol] Itching 11/17/2012  . Vicodin [hydrocodone-acetaminophen] Itching 11/17/2012    Family History  Problem Relation Age of Onset  . Pancreatic cancer Father   . Esophageal cancer Father   . Cancer Father     esophagus, lungs  . Diabetes Mother   . Heart disease Mother   . Cancer Paternal Grandmother     breast    History   Social History  . Marital Status: Married    Spouse Name: N/A    Number of Children: 3  . Years of Education: N/A   Occupational History  . 3    Social History Main Topics  . Smoking status: Former Smoker -- 0.50 packs/day for 15 years    Types: Cigarettes    Quit date: 11/18/1998  . Smokeless tobacco: Never Used  . Alcohol Use: No  . Drug Use: No  . Sexual Activity: Not Currently   Other Topics Concern  . Not on file   Social History Narrative  . No narrative on file      Physical Exam: Vital signs in last 24 hours: Temp:  [98.5 F (36.9 C)-98.9 F (37.2 C)] 98.6 F (37 C) (08/26 1543) Pulse Rate:  [77-95] 84 (08/26 1543) Resp:  [13-22] 17 (08/26 1543) BP: (108-152)/(58-115) 144/91 mmHg (08/26 1543) SpO2:  [87 %-97 %] 96 % (08/26 1543) Weight:  [102.513 kg (226 lb)] 102.513 kg (226 lb) (08/26 1100) Last BM Date: 02/07/13 General:  Alert,  Well-developed, well-nourished, moderately overweight African American female,  pleasant and cooperative in NAD Head:  Normocephalic and atraumatic. Eyes:  Sclera clear, no icterus.   Mouth:   No ulcerations or lesions.  Oropharynx pink & moist. Lungs:  Clear throughout to auscultation.   No wheezes, crackles, or rhonchi. No evident respiratory distress. Heart:   Regular rate and rhythm; no murmurs, clicks, rubs,  or gallops. Abdomen: Without significant tenderness. Bowel sounds were quiet. Moderate diffuse tympany. Mild subjective tenderness to right upper quadrant palpation, no guarding, no peritoneal findings. Neurologic:  Alert and  coherent;  grossly normal neurologically. Psych:   Alert and cooperative. Normal mood and affect.  Intake/Output from previous day:   Intake/Output this shift:    Lab Results:  Recent Labs  02/07/13 2320 02/08/13 1700  WBC 9.7 13.4*  HGB 13.4 13.9  HCT 39.7 40.8  PLT 287 256   BMET  Recent Labs  02/07/13 2320 02/08/13 1700  NA 137  --   K 4.0  --   CL 99  --   CO2 30  --   GLUCOSE 119*  --   BUN 8  --   CREATININE 0.74 0.46*  CALCIUM 9.3  --    LFT  Recent Labs  02/07/13 2320  PROT 7.5  ALBUMIN 3.2*  AST 17  ALT 40*  ALKPHOS 90  BILITOT 0.4   PT/INR No results found for this basename: LABPROT, INR,  in the last 72 hours   Studies/Results: Dg Chest 2 View  02/08/2013   *RADIOLOGY REPORT*  Clinical Data: Sudden onset of fever  CHEST - 2 VIEW  Comparison: 02/02/2013; 02/03/2005  Findings:  Examination is degraded secondary to patient body habitus.  Grossly unchanged enlarged cardiac silhouette and mediastinal contours.  Grossly unchanged pleuroparenchymal thickening along the right minor fissure.  Bibasilar heterogeneous opacities are grossly unchanged and favored to represent atelectasis or scar.  No new focal airspace opacities.  No pleural effusion or pneumothorax.  No definite evidence of edema.  Post cholecystectomy.  Unchanged bones.  IMPRESSION: Chronic bibasilar atelectasis/scar without acute  cardiopulmonary disease.   Original Report Authenticated By: Tacey Ruiz, MD   Nm Hepatobiliary  02/08/2013   *RADIOLOGY REPORT*  Clinical Data: Post cholecystectomy.  Pain in the abdomen and mid back.  Evaluate for biliary leak.  NUCLEAR MEDICINE HEPATOHBILIARY INCLUDE GB  Radiopharmaceutical:  5 mCi technetium 78m Choletec.  Comparison: None.  Findings: There is prompt visualization of hepatic activity. Activity is seen within the common bile duct and in the intestine indicating patency of the common bile duct.  At approximately 15  minutes after administration of activity, activity began to accumulate in the area of the gallbladder fossa and subhepatic area.  As time progressed more activity accumulated.  This is indicative of biliary leakage.  IMPRESSION:  Post cholecystectomy.  Demonstration of patency of common bile duct.  At approximately 15  minutes after administration of activity, activity began to accumulate in the area of the gallbladder fossa and subhepatic area. As time progressed more activity i accumulated.  This is indicative of biliary leakage.   Original Report Authenticated By: Onalee Hua Call   Ct Abdomen Pelvis W Contrast  02/08/2013   *RADIOLOGY REPORT*  Clinical Data: Postoperative fever  CT ABDOMEN AND PELVIS WITH CONTRAST  Technique:  Multidetector CT imaging of the abdomen and pelvis was performed following the standard protocol during bolus administration of intravenous contrast.  Contrast:  100 cc Omnipaque-300  Comparison: 10/30/2004  Findings: Mild bibasilar opacities, favor atelectasis.  Heart size upper normal.  Low attenuation of the liver suggests fatty infiltration.  Status post cholecystectomy.  4.2 x 2.3 cm mixed attenuation along the gallbladder fossa is favored to reflect postoperative fluid intermixed with packing material/Surgicel.  No biliary ductal dilatation.  Low attenuation of the pancreas is similar to prior.  Unremarkable adrenal glands.  A couple hypodensities  within the kidneys, too small to characterize.  No hydroureteronephrosis.  No urinary tract calculi visualized.  No CT evidence for colitis.  Small bowel loops are normal course and caliber.  No free intraperitoneal air or fluid.  Small fat containing umbilical hernia with a locule of associated gas, not unexpected in the recently postoperative state.  Partially decompressed bladder.  Absent uterus.  No adnexal mass.  There is scattered atherosclerotic calcification of the aorta and its branches. No aneurysmal dilatation.  No acute osseous finding.  IMPRESSION: Postoperative changes of recent laparoscopic cholecystectomy. Mixed attenuation collection along the gallbladder fossa is favored to reflect packing material/Surgicel intermixed with expected postoperative fluid.  Correlate with surgical technique. Infection cannot be excluded by imaging however there are no specific findings such as rim enhancement or gas within the collection to favor this.  Bibasilar atelectasis.  Nonspecific low attenuation of the pancreas, may reflect fatty infiltration.  Hepatic steatosis.   Original Report Authenticated By: Jearld Lesch, M.D.   Dg Chest Port 1 View  02/08/2013   *RADIOLOGY REPORT*  Clinical Data: Low oxygen saturations.  Fever.  PORTABLE CHEST - 1 VIEW  Comparison: Chest x-ray 02/08/2013.  Findings: Lung volumes are low.  No definite consolidative airspace disease.  No pleural effusions.  Mild crowding of the pulmonary vasculature, accentuated by low lung volumes, without frank pulmonary edema.  Heart size is borderline enlarged, likely accentuated by low lung volumes as well as portable AP technique. Mediastinal contours are unremarkable.  Atherosclerosis of the thoracic aorta.  IMPRESSION: 1.  Low lung volumes without definite radiographic evidence of acute cardiopulmonary disease. 2.  Atherosclerosis.   Original Report Authenticated By: Trudie Reed, M.D.    Impression: Bile leak status post  laparoscopic cholecystectomy, without severe clinical compromise at the present time  Plan: I agree with Dr. Dwain Sarna that the patient needs ERCP with biliary stent placement. The nature purpose risks and alternatives of the procedure were discussed with the patient and her daughter and brother at the bedside. I went over the risks, including a roughly 1-3% risk of pancreatitis which could rarely be fatal, cardiopulmonary problems, bleeding, infection, or perforation. She is agreeable to proceed. She understands that it will be done by one of my partners. The exact time and location is yet to be determined, depending on the OR schedule and availability tomorrow.   LOS: 0 days   Addi Pak V  02/08/2013, 7:50 PM

## 2013-02-08 NOTE — ED Notes (Signed)
Attempted report 

## 2013-02-08 NOTE — Progress Notes (Signed)
Patient ID: Nina Pitts, female   DOB: 01-01-1961, 52 y.o.   MRN: [id number] Patient with continued ruq pain, hida scan shows: NUCLEAR MEDICINE HEPATOHBILIARY INCLUDE GB  Radiopharmaceutical: 5 mCi technetium 53m Choletec.  Comparison: None.  Findings: There is prompt visualization of hepatic activity.  Activity is seen within the common bile duct and in the intestine  indicating patency of the common bile duct.  At approximately 15 minutes after administration of activity,  activity began to accumulate in the area of the gallbladder fossa  and subhepatic area. As time progressed more activity  accumulated. This is indicative of biliary leakage.  IMPRESSION:  Post cholecystectomy.  Demonstration of patency of common bile duct.  At approximately 15 minutes after administration of activity,  activity began to accumulate in the area of the gallbladder fossa  and subhepatic area. As time progressed more activity i  accumulated. This is indicative of biliary leakage.  She had difficult gb, no indication of bile duct injury but does look like she either has cystic duct leak or duct of luschka leak.  Discussed with patient, will call gi to discuss endoscopic evaluation and treatment

## 2013-02-08 NOTE — ED Provider Notes (Signed)
CSN: [id number]     Arrival date & time 02/07/13  2238 History   First MD Initiated Contact with Patient 02/08/13 [phone]     Chief Complaint  Patient presents with  . Fever   (Consider location/radiation/quality/duration/timing/severity/associated sxs/prior Treatment) Patient is a 52 y.o. female presenting with fever. The history is provided by the patient. The history is limited by a language barrier. No language interpreter was used.  Fever Max temp prior to arrival:  101 Temp source:  Oral Severity:  Moderate Onset quality:  Gradual Timing:  Constant Progression:  Unchanged Chronicity:  New Relieved by:  Nothing Worsened by:  Nothing tried Ineffective treatments:  None tried Associated symptoms: no chest pain, no chills, no dysuria and no rash   Risk factors: no hx of cancer     Past Medical History  Diagnosis Date  . Anxiety   . Fibromyalgia   . Gallstones   . Hypertension   . IBS (irritable bowel syndrome)   . Obesity   . GERD (gastroesophageal reflux disease)   . Hypothyroidism   . Carpal tunnel syndrome   . Arthritis     "joints" (02/03/2013)  . Osteoarthritis of both knees   . Gout attack ~ 2011  . PTSD (post-traumatic stress disorder)   . Fatty liver disease, nonalcoholic dx'd "~ 12/8467"   Past Surgical History  Procedure Laterality Date  . Cesarean section  1979; 1980; 1986  . [facility]ia [facility]  ~ [facility]   . Axillary hidradenitis excision Bilateral 1990's    with stsg  . Pilonidal cyst excision  2000?  Marland Kitchen Cholecystectomy  02/03/2013  . Knee arthroscopy Right 1990'S  . Abdominal exploration surgery  04/27/2001    w/LOA/notes 04/27/2001 (02/03/2013)  . Bilateral salpingoophorectomy Bilateral 04/27/2001    Kay Perch 04/27/2001 (02/03/2013)  . Appendectomy  ~ 1974  . Abdominal hysterectomy  ~ 2000    partial  . Cardiac catheterization  ~ 2005  . Cholecystectomy N/A 02/03/2013    Procedure: LAPAROSCOPIC CHOLECYSTECTOMY WITH INTRAOPERATIVE CHOLANGIOGRAM;   Surgeon: [name], MD;  Location: [facility] OR;  Service: General;  Laterality: N/A;   Family History  Problem Relation Age of Onset  . Pancreatic cancer Father   . Esophageal cancer Father   . Cancer Father     esophagus, lungs  . Diabetes Mother   . Heart disease Mother   . Cancer Paternal Grandmother     breast   History  Substance Use Topics  . Smoking status: Former Smoker -- 0.50 packs/day for 15 years    Types: Cigarettes    Quit date: 11/18/1998  . Smokeless tobacco: Never Used  . Alcohol Use: No   OB History   Grav Para Term Preterm Abortions TAB SAB Ect Mult Living                 Review of Systems  Constitutional: Positive for fever. Negative for chills.  Respiratory: Negative for shortness of breath.   Cardiovascular: Negative for chest pain, palpitations and leg swelling.  Genitourinary: Negative for dysuria.  Skin: Negative for rash.  All other systems reviewed and are negative.    Allergies  Aspirin; Robaxin; and Vicodin  Home Medications   Current Outpatient Rx  Name  Route  Sig  Dispense  Refill  . aspirin 81 MG chewable tablet   Oral   Chew 81 mg by mouth daily.         Marland Kitchen levothyroxine (SYNTHROID, LEVOTHROID) 200 MCG tablet  Oral   Take 200 mcg by mouth daily before breakfast.         . LORazepam (ATIVAN) 0.5 MG tablet   Oral   Take 0.5 mg by mouth 2 (two) times daily as needed for anxiety.          Marland Kitchen losartan (COZAAR) 50 MG tablet   Oral   Take 50 mg by mouth daily.         . metaxalone (SKELAXIN) 800 MG tablet   Oral   Take 800 mg by mouth 3 (three) times daily as needed for pain.          . Multiple Vitamin (MULTIVITAMIN) tablet   Oral   Take 1 tablet by mouth daily.         Marland Kitchen omeprazole (PRILOSEC) 20 MG capsule   Oral   Take 1 capsule (20 mg total) by mouth daily.   30 capsule   6   . oxyCODONE (OXY IR/ROXICODONE) 5 MG immediate release tablet   Oral   Take 1 tablet (5 mg total) by mouth every 4 (four)  hours as needed.   30 tablet   0   . Probiotic Product (ALIGN) 4 MG CAPS   Oral   Take 1 capsule by mouth daily.         . Simethicone (GAS-X PO)   Oral   Take 1 tablet by mouth every 8 (eight) hours as needed. For gas          BP 120/65  Pulse 80  Temp(Src) 98.9 F (37.2 C) (Oral)  SpO2 97% Physical Exam  Constitutional: She is oriented to person, place, and time. She appears well-developed and well-nourished. No distress.  HENT:  Head: Normocephalic and atraumatic.  Mouth/Throat: Oropharynx is clear and moist.  Eyes: Conjunctivae are normal. Pupils are equal, round, and reactive to light.  Neck: Normal range of motion. Neck supple.  Cardiovascular: Normal rate, regular rhythm and intact distal pulses.   Pulmonary/Chest: Effort normal and breath sounds normal. She has no wheezes. She has no rales.  Abdominal: Soft. Bowel sounds are normal. There is tenderness. There is no rebound and no guarding.  Musculoskeletal: Normal range of motion.  Neurological: She is alert and oriented to person, place, and time.  Skin: Skin is warm and dry.  Psychiatric: She has a normal mood and affect.    ED Course  Procedures (including critical care time) Labs Review Labs Reviewed  COMPREHENSIVE METABOLIC PANEL - Abnormal; Notable for the following:    Glucose, Bld 119 (*)    Albumin 3.2 (*)    ALT 40 (*)    All other components within normal limits  URINALYSIS, ROUTINE W REFLEX MICROSCOPIC  CBC WITH DIFFERENTIAL   Imaging Review Dg Chest 2 View  02/08/2013   *RADIOLOGY REPORT*  Clinical Data: Sudden onset of fever  CHEST - 2 VIEW  Comparison: 02/02/2013; 02/03/2005  Findings:  Examination is degraded secondary to patient body habitus.  Grossly unchanged enlarged cardiac silhouette and mediastinal contours.  Grossly unchanged pleuroparenchymal thickening along the right minor fissure.  Bibasilar heterogeneous opacities are grossly unchanged and favored to represent atelectasis or  scar.  No new focal airspace opacities.  No pleural effusion or pneumothorax.  No definite evidence of edema.  Post cholecystectomy.  Unchanged bones.  IMPRESSION: Chronic bibasilar atelectasis/scar without acute cardiopulmonary disease.   Original Report Authenticated By: Tacey Ruiz, MD    MDM  Increased pain post CT case discussed again with Dr.  Toth surgery to see will keep NPO    Melyna Huron K Kaelin Holford-Rasch, MD 02/08/13 [phone]

## 2013-02-08 NOTE — H&P (Signed)
I have seen and examined the pt and agree with PA-Osborne's progress note. Admit HIDA scan Await lipase. D/w Dr. Dwain Sarna

## 2013-02-08 NOTE — H&P (Signed)
Nina Pitts [birth year]  [id number].   Chief Complaint/Reason for Consult: fever HPI: This is a patient of Dr. Doreen Salvage, who underwent a lap chole last Thursday.  She did well and has been at home.  She has been tolerating a regular diet and began having BMs yesterday.  She denies any nausea, cough, chest pain, SOB, or dysuria.  She states she ran a fever of 101 at home and was told to come to the ED.  She came and was given oral contrast for a CT scan.  Once she drank this she started having spasms, "like muscle spasms" in the RUQ that radiated around her back.  Because her pain persisted, Dr. Dwain Sarna felt that she should be admitted for further observation.  Review of Systems: Please see HPI, otherwise negative  Family History  Problem Relation Age of Onset  . Pancreatic cancer Father   . Esophageal cancer Father   . Cancer Father     esophagus, lungs  . Diabetes Mother   . Heart disease Mother   . Cancer Paternal Grandmother     breast    Past Medical History  Diagnosis Date  . Anxiety   . Fibromyalgia   . Gallstones   . Hypertension   . IBS (irritable bowel syndrome)   . Obesity   . GERD (gastroesophageal reflux disease)   . Hypothyroidism   . Carpal tunnel syndrome   . Arthritis     "joints" (02/03/2013)  . Osteoarthritis of both knees   . Gout attack ~ 2011  . PTSD (post-traumatic stress disorder)   . Fatty liver disease, nonalcoholic dx'd "~ 02/6294"    Past Surgical History  Procedure Laterality Date  . Cesarean section  1979; 1980; 1986  . Hernia repair  ~ 1974    [facility]   . Axillary hidradenitis excision Bilateral 1990's    with stsg  . Pilonidal cyst excision  2000?  Marland Kitchen Cholecystectomy  02/03/2013  . Knee arthroscopy Right 1990'S  . Abdominal exploration surgery  04/27/2001    w/LOA/notes 04/27/2001 (02/03/2013)  . Bilateral salpingoophorectomy Bilateral 04/27/2001    Shandale Perch 04/27/2001 (02/03/2013)  . Appendectomy  ~ 1974  . Abdominal hysterectomy  ~  2000    partial  . Cardiac catheterization  ~ 2005  . Cholecystectomy N/A 02/03/2013    Procedure: LAPAROSCOPIC CHOLECYSTECTOMY WITH INTRAOPERATIVE CHOLANGIOGRAM;  Surgeon: [name], MD;  Location: [facility] OR;  Service: General;  Laterality: N/A;    Social History:  reports that she quit smoking about 14 years ago. Her smoking use included Cigarettes. She has a 7.5 pack-year smoking history. She has never used smokeless tobacco. She reports that she does not drink alcohol or use illicit drugs.  Allergies:  Allergies  Allergen Reactions  . Aspirin Nausea And Vomiting    Adult strength only  . Robaxin [Methocarbamol] Itching  . Vicodin [Hydrocodone-Acetaminophen] Itching     (Not in a hospital admission)  Blood pressure 145/104, pulse 95, temperature 98.9 F (37.2 C), temperature source Oral, resp. rate 15, SpO2 95.00%. Physical Exam: General: pleasant, obese black female who is laying in bed in mild distress secondary to pain HEENT: head is normocephalic, atraumatic.  Sclera are noninjected.  PERRL.  Ears and nose without any masses or lesions.  Mouth is pink and moist Heart: regular, rate, and rhythm.  Normal s1,s2. No obvious murmurs, gallops, or rubs noted.  Palpable radial and pedal pulses bilaterally Lungs: CTAB, no wheezes, rhonchi, or rales noted.  Respiratory effort nonlabored Abd:  soft, mild distention, minimal epigastric tenderness, +BS, no masses, hernias, or organomegaly, incisions are all c/d/i MS: all 4 extremities are symmetrical with no cyanosis, clubbing, or edema. Skin: warm and dry with no masses, lesions, or rashes Psych: A&Ox3 with an appropriate affect.    Results for orders placed during the hospital encounter of 02/08/13 (from the past 48 hour(s))  URINALYSIS, ROUTINE W REFLEX MICROSCOPIC     Status: None   Collection Time    02/07/13 11:00 PM      Result Value Range   Color, Urine YELLOW  YELLOW   APPearance CLEAR  CLEAR   Specific Gravity, Urine  1.013  1.005 - 1.030   pH 8.0  5.0 - 8.0   Glucose, UA NEGATIVE  NEGATIVE mg/dL   Hgb urine dipstick NEGATIVE  NEGATIVE   Bilirubin Urine NEGATIVE  NEGATIVE   Ketones, ur NEGATIVE  NEGATIVE mg/dL   Protein, ur NEGATIVE  NEGATIVE mg/dL   Urobilinogen, UA 1.0  0.0 - 1.0 mg/dL   Nitrite NEGATIVE  NEGATIVE   Leukocytes, UA NEGATIVE  NEGATIVE   Comment: MICROSCOPIC NOT DONE ON URINES WITH NEGATIVE PROTEIN, BLOOD, LEUKOCYTES, NITRITE, OR GLUCOSE <1000 mg/dL.  CBC WITH DIFFERENTIAL     Status: None   Collection Time    02/07/13 11:20 PM      Result Value Range   WBC 9.7  4.0 - 10.5 K/uL   RBC 4.31  3.87 - 5.11 MIL/uL   Hemoglobin 13.4  12.0 - 15.0 g/dL   HCT 16.1  09.6 - 04.5 %   MCV 92.1  78.0 - 100.0 fL   MCH 31.1  26.0 - 34.0 pg   MCHC 33.8  30.0 - 36.0 g/dL   RDW 40.9  81.1 - 91.4 %   Platelets 287  150 - 400 K/uL   Neutrophils Relative % 61  43 - 77 %   Neutro Abs 6.0  1.7 - 7.7 K/uL   Lymphocytes Relative 29  12 - 46 %   Lymphs Abs 2.8  0.7 - 4.0 K/uL   Monocytes Relative 7  3 - 12 %   Monocytes Absolute 0.7  0.1 - 1.0 K/uL   Eosinophils Relative 3  0 - 5 %   Eosinophils Absolute 0.3  0.0 - 0.7 K/uL   Basophils Relative 0  0 - 1 %   Basophils Absolute 0.0  0.0 - 0.1 K/uL  COMPREHENSIVE METABOLIC PANEL     Status: Abnormal   Collection Time    02/07/13 11:20 PM      Result Value Range   Sodium 137  135 - 145 mEq/L   Potassium 4.0  3.5 - 5.1 mEq/L   Chloride 99  96 - 112 mEq/L   CO2 30  19 - 32 mEq/L   Glucose, Bld 119 (*) 70 - 99 mg/dL   BUN 8  6 - 23 mg/dL   Creatinine, Ser 7.82  0.50 - 1.10 mg/dL   Calcium 9.3  8.4 - 95.6 mg/dL   Total Protein 7.5  6.0 - 8.3 g/dL   Albumin 3.2 (*) 3.5 - 5.2 g/dL   AST 17  0 - 37 U/L   ALT 40 (*) 0 - 35 U/L   Alkaline Phosphatase 90  39 - 117 U/L   Total Bilirubin 0.4  0.3 - 1.2 mg/dL   GFR calc non Af Amer >90  >90 mL/min   GFR calc Af Amer >90  >90 mL/min   Comment: (NOTE)  The eGFR has been calculated using the CKD EPI  equation.     This calculation has not been validated in all clinical situations.     eGFR's persistently <90 mL/min signify possible Chronic Kidney     Disease.   Dg Chest 2 View  02/08/2013   *RADIOLOGY REPORT*  Clinical Data: Sudden onset of fever  CHEST - 2 VIEW  Comparison: 02/02/2013; 02/03/2005  Findings:  Examination is degraded secondary to patient body habitus.  Grossly unchanged enlarged cardiac silhouette and mediastinal contours.  Grossly unchanged pleuroparenchymal thickening along the right minor fissure.  Bibasilar heterogeneous opacities are grossly unchanged and favored to represent atelectasis or scar.  No new focal airspace opacities.  No pleural effusion or pneumothorax.  No definite evidence of edema.  Post cholecystectomy.  Unchanged bones.  IMPRESSION: Chronic bibasilar atelectasis/scar without acute cardiopulmonary disease.   Original Report Authenticated By: Tacey Ruiz, MD   Ct Abdomen Pelvis W Contrast  02/08/2013   *RADIOLOGY REPORT*  Clinical Data: Postoperative fever  CT ABDOMEN AND PELVIS WITH CONTRAST  Technique:  Multidetector CT imaging of the abdomen and pelvis was performed following the standard protocol during bolus administration of intravenous contrast.  Contrast:  100 cc Omnipaque-300  Comparison: 10/30/2004  Findings: Mild bibasilar opacities, favor atelectasis.  Heart size upper normal.  Low attenuation of the liver suggests fatty infiltration.  Status post cholecystectomy.  4.2 x 2.3 cm mixed attenuation along the gallbladder fossa is favored to reflect postoperative fluid intermixed with packing material/Surgicel.  No biliary ductal dilatation.  Low attenuation of the pancreas is similar to prior.  Unremarkable adrenal glands.  A couple hypodensities within the kidneys, too small to characterize.  No hydroureteronephrosis.  No urinary tract calculi visualized.  No CT evidence for colitis.  Small bowel loops are normal course and caliber.  No free  intraperitoneal air or fluid.  Small fat containing umbilical hernia with a locule of associated gas, not unexpected in the recently postoperative state.  Partially decompressed bladder.  Absent uterus.  No adnexal mass.  There is scattered atherosclerotic calcification of the aorta and its branches. No aneurysmal dilatation.  No acute osseous finding.  IMPRESSION: Postoperative changes of recent laparoscopic cholecystectomy. Mixed attenuation collection along the gallbladder fossa is favored to reflect packing material/Surgicel intermixed with expected postoperative fluid.  Correlate with surgical technique. Infection cannot be excluded by imaging however there are no specific findings such as rim enhancement or gas within the collection to favor this.  Bibasilar atelectasis.  Nonspecific low attenuation of the pancreas, may reflect fatty infiltration.  Hepatic steatosis.   Original Report Authenticated By: Jearld Lesch, M.D.   Dg Chest Port 1 View  02/08/2013   *RADIOLOGY REPORT*  Clinical Data: Low oxygen saturations.  Fever.  PORTABLE CHEST - 1 VIEW  Comparison: Chest x-ray 02/08/2013.  Findings: Lung volumes are low.  No definite consolidative airspace disease.  No pleural effusions.  Mild crowding of the pulmonary vasculature, accentuated by low lung volumes, without frank pulmonary edema.  Heart size is borderline enlarged, likely accentuated by low lung volumes as well as portable AP technique. Mediastinal contours are unremarkable.  Atherosclerosis of the thoracic aorta.  IMPRESSION: 1.  Low lung volumes without definite radiographic evidence of acute cardiopulmonary disease. 2.  Atherosclerosis.   Original Report Authenticated By: Trudie Reed, M.D.       Assessment/Plan 1. S/p lap chole 2. Post op fever at home 3. new onset abdominal pain after drinking CT scan contrast  Plan: 1. Will admit for Dr. Dwain Sarna.  He spoke to Dr, Derrell Lolling and would like for the patient to get a HIDA scan  to rule out a bile leak.  Her labs are all normal along with her CT scan, currently.  Will observe.  Baljit Liebert E 02/08/2013, 8:28 AM Pager: [phone]

## 2013-02-09 ENCOUNTER — Encounter (HOSPITAL_COMMUNITY): Payer: Self-pay | Admitting: Anesthesiology

## 2013-02-09 ENCOUNTER — Observation Stay (HOSPITAL_COMMUNITY): Payer: Medicare Other | Admitting: Anesthesiology

## 2013-02-09 ENCOUNTER — Encounter (HOSPITAL_COMMUNITY)
Admission: EM | Disposition: A | Discharge status: Home / Self Care | Payer: Self-pay | Source: Home / Self Care | Attending: Emergency Medicine

## 2013-02-09 HISTORY — PX: ERCP: SHX5425

## 2013-02-09 LAB — COMPREHENSIVE METABOLIC PANEL
Alkaline Phosphatase: 97 U/L (ref 39–117)
BUN: 5 mg/dL — ABNORMAL LOW (ref 6–23)
Creatinine, Ser: 0.58 mg/dL (ref 0.50–1.10)
GFR calc Af Amer: >90 mL/min (ref >90)
Glucose, Bld: 101 mg/dL — ABNORMAL HIGH (ref 70–99)
Potassium: 3.9 mEq/L (ref 3.5–5.1)
Total Bilirubin: 0.5 mg/dL (ref 0.3–1.2)
Total Protein: 6.9 g/dL (ref 6.0–8.3)

## 2013-02-09 LAB — TYPE AND SCREEN
ABO/RH(D): A POS
Antibody Screen: NEGATIVE

## 2013-02-09 LAB — SURGICAL PCR SCREEN: MRSA, PCR: NEGATIVE

## 2013-02-09 SURGERY — ERCP, WITH INTERVENTION IF INDICATED
Anesthesia: General

## 2013-02-09 MED ORDER — FENTANYL CITRATE 0.05 MG/ML IJ SOLN
INTRAMUSCULAR | Status: DC | PRN
  Administered 2013-02-09: 50 ug via INTRAVENOUS

## 2013-02-09 MED ORDER — LACTATED RINGERS IV SOLN
INTRAVENOUS | Status: DC | PRN
  Administered 2013-02-09: 11:00:00 via INTRAVENOUS

## 2013-02-09 MED ORDER — ROCURONIUM BROMIDE 100 MG/10ML IV SOLN
INTRAVENOUS | Status: DC | PRN
  Administered 2013-02-09: 40 mg via INTRAVENOUS

## 2013-02-09 MED ORDER — MIDAZOLAM HCL 5 MG/5ML IJ SOLN
INTRAMUSCULAR | Status: DC | PRN
  Administered 2013-02-09: 2 mg via INTRAVENOUS

## 2013-02-09 MED ORDER — NEOSTIGMINE METHYLSULFATE 1 MG/ML IJ SOLN
INTRAMUSCULAR | Status: DC | PRN
  Administered 2013-02-09: 4 mg via INTRAVENOUS

## 2013-02-09 MED ORDER — ONDANSETRON HCL 4 MG/2ML IJ SOLN: 4.0000 mg | Freq: Four times a day (QID) | INTRAMUSCULAR | Status: DC | PRN

## 2013-02-09 MED ORDER — GLYCOPYRROLATE 0.2 MG/ML IJ SOLN
INTRAMUSCULAR | Status: DC | PRN
  Administered 2013-02-09: .8 mg via INTRAVENOUS

## 2013-02-09 MED ORDER — OXYCODONE HCL 5 MG/5ML PO SOLN: 5.0000 mg | Freq: Once | ORAL | Status: DC | PRN

## 2013-02-09 MED ORDER — OXYCODONE HCL 5 MG PO TABS: 5.0000 mg | ORAL_TABLET | Freq: Once | ORAL | Status: DC | PRN

## 2013-02-09 MED ORDER — SODIUM CHLORIDE 0.9 % IV SOLN
INTRAVENOUS | Status: DC | PRN
  Administered 2013-02-09: 13:00:00

## 2013-02-09 MED ORDER — LIDOCAINE HCL (CARDIAC) 20 MG/ML IV SOLN
INTRAVENOUS | Status: DC | PRN
  Administered 2013-02-09: 50 mg via INTRAVENOUS

## 2013-02-09 MED ORDER — PROPOFOL 10 MG/ML IV BOLUS
INTRAVENOUS | Status: DC | PRN
  Administered 2013-02-09: 200 mg via INTRAVENOUS

## 2013-02-09 MED ORDER — FENTANYL CITRATE 0.05 MG/ML IJ SOLN: 25.0000 ug | INTRAMUSCULAR | Status: DC | PRN

## 2013-02-09 MED ORDER — ONDANSETRON HCL 4 MG/2ML IJ SOLN
INTRAMUSCULAR | Status: DC | PRN
  Administered 2013-02-09: 4 mg via INTRAVENOUS

## 2013-02-09 MED ORDER — ARTIFICIAL TEARS OP OINT
TOPICAL_OINTMENT | OPHTHALMIC | Status: DC | PRN
  Administered 2013-02-09: 1 via OPHTHALMIC

## 2013-02-09 MED ORDER — DEXTROSE 5 % IV SOLN
1.0000 g | Freq: Once | INTRAVENOUS | Status: AC
  Administered 2013-02-09: 1 g via INTRAVENOUS
  Filled 2013-02-09: qty 1

## 2013-02-09 NOTE — Progress Notes (Signed)
Quiet night. Ready for ERCP this morning. No significant abdominal pain. No fever. Mild rise in white count to 13,400. Liver chemistries not repeated.  Abdomen is slightly tender in right upper quadrant.  Impression: Apparent mild bile leak. There is a possibility of coexisting choledocholithiasis, since an intraoperative cholangiogram was not obtained during her recent surgery.  Plan: Proceed to ERCP with anticipated stent placement by Dr. Ewing Schlein this morning.  Florencia Reasons, M.D. [phone]

## 2013-02-09 NOTE — Progress Notes (Signed)
Nina Pitts 11:45 AM  Subjective: Patient seen and examined in preop and discussed with my partner and computer reviewed currently other than pain she is doing okay  Objective: Vital signs stable afebrile no acute distress labs and x-rays reviewed exam please see pre-assessment evaluation  Assessment: Bile leak  Plan: We rediscussed ERCP including success rate etc. and will proceed with anesthesia today  [name]

## 2013-02-09 NOTE — Preoperative (Signed)
Beta Blockers   Reason not to administer Beta Blockers:Not Applicable 

## 2013-02-09 NOTE — Transfer of Care (Signed)
Immediate Anesthesia Transfer of Care Note  Patient: Nina Pitts  Procedure(s) Performed: Procedure(s): ENDOSCOPIC RETROGRADE CHOLANGIOPANCREATOGRAPHY (ERCP) with Stent Placement (N/A)  Patient Location: PACU  Anesthesia Type:General  Level of Consciousness: awake, alert  and oriented  Airway & Oxygen Therapy: Patient Spontanous Breathing and Patient connected to nasal cannula oxygen  Post-op Assessment: Report given to PACU RN, Post -op Vital signs reviewed and stable and Patient moving all extremities X 4  Post vital signs: Reviewed and stable  Complications: No apparent anesthesia complications

## 2013-02-09 NOTE — Anesthesia Preprocedure Evaluation (Addendum)
Anesthesia Evaluation  Patient identified by MRN, date of birth, ID band Patient awake    Reviewed: Allergy & Precautions, H&P , NPO status , Patient's Chart, lab work & pertinent test results  Airway Mallampati: II  Neck ROM: full    Dental  (+) Poor Dentition and Dental Advidsory Given   Pulmonary former smoker,          Cardiovascular hypertension,     Neuro/Psych Anxiety H/o PTSD Neuromuscular disease    GI/Hepatic GERD-  ,S/p recent cholecystectomy, now with suspected bile leak.   Endo/Other  Hypothyroidism Morbid obesity  Renal/GU      Musculoskeletal  (+) Arthritis -, Fibromyalgia -  Abdominal   Peds  Hematology   Anesthesia Other Findings   Reproductive/Obstetrics                          Anesthesia Physical Anesthesia Plan  ASA: III  Anesthesia Plan: General   Post-op Pain Management:    Induction: Intravenous  Airway Management Planned: Oral ETT  Additional Equipment:   Intra-op Plan:   Post-operative Plan: Extubation in OR  Informed Consent: I have reviewed the patients History and Physical, chart, labs and discussed the procedure including the risks, benefits and alternatives for the proposed anesthesia with the patient or authorized representative who has indicated his/her understanding and acceptance.   Dental Advisory Given  Plan Discussed with: CRNA, Anesthesiologist and Surgeon  Anesthesia Plan Comments:        Anesthesia Quick Evaluation

## 2013-02-09 NOTE — Progress Notes (Signed)
ERCP scheduled for 11:30 am today (Dr. Ewing Schlein).

## 2013-02-09 NOTE — Anesthesia Procedure Notes (Signed)
Procedure Name: Intubation Date/Time: 02/09/2013 12:00 PM Performed by: Carmela Rima Pre-anesthesia Checklist: Patient identified, Emergency Drugs available, Suction available, Patient being monitored and Timeout performed Patient Re-evaluated:Patient Re-evaluated prior to inductionOxygen Delivery Method: Circle system utilized Preoxygenation: Pre-oxygenation with 100% oxygen Intubation Type: IV induction Ventilation: Mask ventilation without difficulty Laryngoscope Size: Mac and 4 Grade View: Grade II Tube type: Oral Tube size: 7.5 mm Number of attempts: 1 Placement Confirmation: ETT inserted through vocal cords under direct vision,  positive ETCO2 and breath sounds checked- equal and bilateral Secured at: 23 cm Tube secured with: Tape Dental Injury: Teeth and Oropharynx as per pre-operative assessment

## 2013-02-09 NOTE — Anesthesia Postprocedure Evaluation (Signed)
Anesthesia Post Note  Patient: Nina Pitts  Procedure(s) Performed: Procedure(s) (LRB): ENDOSCOPIC RETROGRADE CHOLANGIOPANCREATOGRAPHY (ERCP) with Stent Placement (N/A)  Anesthesia type: General  Patient location: PACU  Post pain: Pain level controlled and Adequate analgesia  Post assessment: Post-op Vital signs reviewed, Patient's Cardiovascular Status Stable, Respiratory Function Stable, Patent Airway and Pain level controlled  Last Vitals:  Filed Vitals:   02/09/13 1330  BP: 129/79  Pulse: 79  Temp:   Resp: 16    Post vital signs: Reviewed and stable  Level of consciousness: awake, alert  and oriented  Complications: No apparent anesthesia complications

## 2013-02-09 NOTE — Op Note (Signed)
[facility] [address] [place], [address]   ERCP PROCEDURE REPORT  PATIENT: Nina, Pitts.  MR# :[id number] BIRTHDATE: 12-08-1960  GENDER: Female ENDOSCOPIST: Vida Rigger, MD REFERRED BY: PROCEDURE DATE:  02/09/2013 PROCEDURE:   ERCP with sphincterotomy/papillotomy and ERCP with stent placement and balloonpull-throughASA CLASS: 3 INDICATIONS:bile leak MEDICATIONS:   general TOPICAL ANESTHETIC:  none  DESCRIPTION OF PROCEDURE:   After the risks benefits and alternatives of the procedure were thoroughly explained, informed consent was obtained.  The Pentax ERCP [id number]  endoscope was introduced through the mouth and advanced to the second portion of the duodenum .a normal appearing ampulla was brought into view and using the triple-lumen sphincterotome loaded with the JAG Jagwire deep selective cannulation was obtained and there was no PD injections or wire advancements throughout the procedure and the wire was advanced into the intrahepatics and dye was injected and there was no obvious bile leak however we proceeded with a medium-sized sphincterotomy in the customary fashion until we can get the fully bowed sphincterotome easily in and out of the duct however there was not much biliary drainage and on fluoroscopy there was a question of a distal stone so we exchanged the sphincterotome for the 12-15 mm adjustable balloon and went ahead and proceeded with one balloon through which passed readily through the sphincterotomy site and no stone was seen however drainage was much improved at this point and on repeat cholangiogram the distal duct looked better and no obvious stone was seen so we went ahead and placed a 10 French 5 cm double pigtail stent in the customary fashion with adequate positioning both endoscopically and under fluoroscopy and adequate biliary drainage and the wire was removed and we elected to place a double pigtail because of  the sphincterotomy and the seemingly dilated CBD and the scope was removed the patient tolerated the procedure well there was no obvious immediate complication       COMPLICATIONS: none  ENDOSCOPIC IMPRESSION:1 normal ampulla 2. No PD injections orwireadvancements3. Dilated CBD with questionable distal CBD stone status post sphincterotomy and balloonthru without obvious stones seen and adequate biliary drainage after above #4. 10 French 5 cm double pigtail stent placedRECOMMENDATIONS:customary post-ERCP orders my partner will round on tomorrow and hopefully she'll be able to go home soon if no delayed complications or other problems and followup in roughly 1 month to set up stent removal in 6 weeks    _______________________________ eSigned:  Vida Rigger, MD 02/09/2013 1:03 PM   CC:

## 2013-02-09 NOTE — Progress Notes (Signed)
Subjective: Feels better this am, no complaints  Objective: Vital signs in last 24 hours: Temp:  [98.6 F (37 C)-99.3 F (37.4 C)] 98.9 F (37.2 C) (08/27 0601) Pulse Rate:  [77-105] 84 (08/27 0601) Resp:  [13-22] 17 (08/27 0601) BP: (108-152)/(49-115) 119/59 mmHg (08/27 0601) SpO2:  [87 %-98 %] 98 % (08/27 0601) Weight:  [226 lb (102.513 kg)] 226 lb (102.513 kg) (08/26 1100) Last BM Date: 02/07/13  Intake/Output from previous day: 08/26 0701 - 08/27 0700 In: 1363.8 [P.O.:300; I.V.:1063.8] Out: 450 [Urine:450] Intake/Output this shift: Total I/O In: 905 [P.O.:300; I.V.:605] Out: 450 [Urine:450]  General appearance: no distress Resp: clear to auscultation bilaterally Cardio: regular rate and rhythm GI: wounds without infection, soft, mild tenderness ruq  Lab Results:   Recent Labs  02/07/13 2320 02/08/13 1700  WBC 9.7 13.4*  HGB 13.4 13.9  HCT 39.7 40.8  PLT 287 256   BMET  Recent Labs  02/07/13 2320 02/08/13 1700  NA 137  --   K 4.0  --   CL 99  --   CO2 30  --   GLUCOSE 119*  --   BUN 8  --   CREATININE 0.74 0.46*  CALCIUM 9.3  --    PT/INR No results found for this basename: LABPROT, INR,  in the last 72 hours ABG No results found for this basename: PHART, PCO2, PO2, HCO3,  in the last 72 hours  Studies/Results: Dg Chest 2 View  02/08/2013   *RADIOLOGY REPORT*  Clinical Data: Sudden onset of fever  CHEST - 2 VIEW  Comparison: 02/02/2013; 02/03/2005  Findings:  Examination is degraded secondary to patient body habitus.  Grossly unchanged enlarged cardiac silhouette and mediastinal contours.  Grossly unchanged pleuroparenchymal thickening along the right minor fissure.  Bibasilar heterogeneous opacities are grossly unchanged and favored to represent atelectasis or scar.  No new focal airspace opacities.  No pleural effusion or pneumothorax.  No definite evidence of edema.  Post cholecystectomy.  Unchanged bones.  IMPRESSION: Chronic bibasilar  atelectasis/scar without acute cardiopulmonary disease.   Original Report Authenticated By: Tacey Ruiz, MD   Nm Hepatobiliary  02/08/2013   *RADIOLOGY REPORT*  Clinical Data: Post cholecystectomy.  Pain in the abdomen and mid back.  Evaluate for biliary leak.  NUCLEAR MEDICINE HEPATOHBILIARY INCLUDE GB  Radiopharmaceutical:  5 mCi technetium 77m Choletec.  Comparison: None.  Findings: There is prompt visualization of hepatic activity. Activity is seen within the common bile duct and in the intestine indicating patency of the common bile duct.  At approximately 15  minutes after administration of activity, activity began to accumulate in the area of the gallbladder fossa and subhepatic area.  As time progressed more activity accumulated.  This is indicative of biliary leakage.  IMPRESSION:  Post cholecystectomy.  Demonstration of patency of common bile duct.  At approximately 15  minutes after administration of activity, activity began to accumulate in the area of the gallbladder fossa and subhepatic area. As time progressed more activity i accumulated.  This is indicative of biliary leakage.   Original Report Authenticated By: Onalee Hua Call   Ct Abdomen Pelvis W Contrast  02/08/2013   *RADIOLOGY REPORT*  Clinical Data: Postoperative fever  CT ABDOMEN AND PELVIS WITH CONTRAST  Technique:  Multidetector CT imaging of the abdomen and pelvis was performed following the standard protocol during bolus administration of intravenous contrast.  Contrast:  100 cc Omnipaque-300  Comparison: 10/30/2004  Findings: Mild bibasilar opacities, favor atelectasis.  Heart size upper normal.  Low attenuation of the liver suggests fatty infiltration.  Status post cholecystectomy.  4.2 x 2.3 cm mixed attenuation along the gallbladder fossa is favored to reflect postoperative fluid intermixed with packing material/Surgicel.  No biliary ductal dilatation.  Low attenuation of the pancreas is similar to prior.  Unremarkable adrenal  glands.  A couple hypodensities within the kidneys, too small to characterize.  No hydroureteronephrosis.  No urinary tract calculi visualized.  No CT evidence for colitis.  Small bowel loops are normal course and caliber.  No free intraperitoneal air or fluid.  Small fat containing umbilical hernia with a locule of associated gas, not unexpected in the recently postoperative state.  Partially decompressed bladder.  Absent uterus.  No adnexal mass.  There is scattered atherosclerotic calcification of the aorta and its branches. No aneurysmal dilatation.  No acute osseous finding.  IMPRESSION: Postoperative changes of recent laparoscopic cholecystectomy. Mixed attenuation collection along the gallbladder fossa is favored to reflect packing material/Surgicel intermixed with expected postoperative fluid.  Correlate with surgical technique. Infection cannot be excluded by imaging however there are no specific findings such as rim enhancement or gas within the collection to favor this.  Bibasilar atelectasis.  Nonspecific low attenuation of the pancreas, may reflect fatty infiltration.  Hepatic steatosis.   Original Report Authenticated By: Jearld Lesch, M.D.   Dg Chest Port 1 View  02/08/2013   *RADIOLOGY REPORT*  Clinical Data: Low oxygen saturations.  Fever.  PORTABLE CHEST - 1 VIEW  Comparison: Chest x-ray 02/08/2013.  Findings: Lung volumes are low.  No definite consolidative airspace disease.  No pleural effusions.  Mild crowding of the pulmonary vasculature, accentuated by low lung volumes, without frank pulmonary edema.  Heart size is borderline enlarged, likely accentuated by low lung volumes as well as portable AP technique. Mediastinal contours are unremarkable.  Atherosclerosis of the thoracic aorta.  IMPRESSION: 1.  Low lung volumes without definite radiographic evidence of acute cardiopulmonary disease. 2.  Atherosclerosis.   Original Report Authenticated By: Trudie Reed, M.D.     Anti-infectives: Anti-infectives   None      Assessment/Plan: S/p lap chole with biliary leak  She is doing well this am, labs pending.  No more fevers, mild pain.  Plan for ercp today with gi appreciate their assistance.  [facility] 02/09/2013

## 2013-02-10 ENCOUNTER — Encounter (HOSPITAL_COMMUNITY): Payer: Self-pay | Admitting: Gastroenterology

## 2013-02-10 LAB — CBC WITH DIFFERENTIAL/PLATELET
Basophils Absolute: 0 10*3/uL (ref 0.0–0.1)
Basophils Relative: 0 % (ref 0–1)
Eosinophils Absolute: 0.5 10*3/uL (ref 0.0–0.7)
Eosinophils Relative: 6 % — ABNORMAL HIGH (ref 0–5)
Lymphs Abs: 2.6 10*3/uL (ref 0.7–4.0)
MCH: 31.3 pg (ref 26.0–34.0)
MCHC: 33.7 g/dL (ref 30.0–36.0)
MCV: 92.9 fL (ref 78.0–100.0)
Neutrophils Relative %: 56 % (ref 43–77)
Platelets: 271 10*3/uL (ref 150–400)
RBC: 4.09 MIL/uL (ref 3.87–5.11)
RDW: 13.4 % (ref 11.5–15.5)

## 2013-02-10 LAB — COMPREHENSIVE METABOLIC PANEL
ALT: 25 U/L (ref 0–35)
AST: 13 U/L (ref 0–37)
Albumin: 2.8 g/dL — ABNORMAL LOW (ref 3.5–5.2)
Alkaline Phosphatase: 92 U/L (ref 39–117)
Calcium: 9 mg/dL (ref 8.4–10.5)
GFR calc Af Amer: >90 mL/min (ref >90)
Glucose, Bld: 106 mg/dL — ABNORMAL HIGH (ref 70–99)
Potassium: 3.8 mEq/L (ref 3.5–5.1)
Sodium: 139 mEq/L (ref 135–145)
Total Protein: 6.8 g/dL (ref 6.0–8.3)

## 2013-02-10 MED ORDER — OXYCODONE-ACETAMINOPHEN 5-325 MG PO TABS
1.0000 | ORAL_TABLET | ORAL | Status: DC | PRN
  Administered 2013-02-10: 2 via ORAL
  Filled 2013-02-10: qty 2

## 2013-02-10 NOTE — Progress Notes (Signed)
1 Day Post-Op  Subjective: S/p ercp, not really eating yet, mild abdominal pain, no n/v, feels ok just very tired, hasnt been up much yet  Objective: Vital signs in last 24 hours: Temp:  [98 F (36.7 C)-98.6 F (37 C)] 98.3 F (36.8 C) (08/28 0517) Pulse Rate:  [68-95] 71 (08/28 0517) Resp:  [16-19] 16 (08/28 0517) BP: (115-134)/(63-91) 120/70 mmHg (08/28 0517) SpO2:  [92 %-100 %] 100 % (08/28 0517) Last BM Date: 02/07/13  Intake/Output from previous day: 08/27 0701 - 08/28 0700 In: 2608.8 [P.O.:240; I.V.:2368.8] Out: 2750 [Urine:2750] Intake/Output this shift:    General appearance: no distress GI: soft mild tender appropriate, no infection  Lab Results:   Recent Labs  02/08/13 1700 02/10/13 0510  WBC 13.4* 8.6  HGB 13.9 12.8  HCT 40.8 38.0  PLT 256 271   BMET  Recent Labs  02/09/13 0625 02/10/13 0510  NA 139 139  K 3.9 3.8  CL 103 102  CO2 26 26  GLUCOSE 101* 106*  BUN 5* 5*  CREATININE 0.58 0.64  CALCIUM 8.9 9.0    Studies/Results: Nm Hepatobiliary  02/08/2013   *RADIOLOGY REPORT*  Clinical Data: Post cholecystectomy.  Pain in the abdomen and mid back.  Evaluate for biliary leak.  NUCLEAR MEDICINE HEPATOHBILIARY INCLUDE GB  Radiopharmaceutical:  5 mCi technetium 70m Choletec.  Comparison: None.  Findings: There is prompt visualization of hepatic activity. Activity is seen within the common bile duct and in the intestine indicating patency of the common bile duct.  At approximately 15  minutes after administration of activity, activity began to accumulate in the area of the gallbladder fossa and subhepatic area.  As time progressed more activity accumulated.  This is indicative of biliary leakage.  IMPRESSION:  Post cholecystectomy.  Demonstration of patency of common bile duct.  At approximately 15  minutes after administration of activity, activity began to accumulate in the area of the gallbladder fossa and subhepatic area. As time progressed more activity  i accumulated.  This is indicative of biliary leakage.   Original Report Authenticated By: Onalee Hua Call    Assessment/Plan: S/p lap chole Ercp ? Bile leak, ? Distal stone  Stent placed yesterday, not sure she has leak or stone from Dr Adriana Simas note but this should treat either.  Appreciate GI assistance. Regular diet today, ambulate If she is ready can possibly go home later today but might be tomorrwo  [facility] 02/10/2013

## 2013-02-11 MED ORDER — GLYCERIN (LAXATIVE) 2.1 G RE SUPP
1.0000 | Freq: Once | RECTAL | Status: DC
  Filled 2013-02-11: qty 1

## 2013-02-11 MED ORDER — POLYETHYLENE GLYCOL 3350 17 G PO PACK: 17.0000 g | PACK | Freq: Every day | ORAL | Status: DC

## 2013-02-11 MED ORDER — BISACODYL 10 MG RE SUPP
10.0000 mg | Freq: Once | RECTAL | Status: AC
  Administered 2013-02-11: 10 mg via RECTAL
  Filled 2013-02-11: qty 1

## 2013-02-11 MED ORDER — OXYCODONE HCL 5 MG PO TABS: 5.0000 mg | ORAL_TABLET | Freq: Four times a day (QID) | ORAL | Status: DC | PRN

## 2013-02-11 MED ORDER — POLYETHYLENE GLYCOL 3350 17 G PO PACK
17.0000 g | PACK | Freq: Every day | ORAL | Status: DC
  Administered 2013-02-11: 17 g via ORAL
  Filled 2013-02-11: qty 1

## 2013-02-11 NOTE — Progress Notes (Signed)
Feels well.  Tolerating solid diet.  Wants to have a BM before going home--no result from lax so far.  No new labs today--WBC yest was nl.  Afebrile.  Looks well.  Abd nontender.  IMPR:  Doing well s/p ERCP/stent placement.  There was much better drainage after balloon pull-through during that procedure, raising the question as to whether a stone may have been present, but none was ever visualized.  PLAN:  Ok for dischg from GI standpoint.  Dr. Ewing Schlein will contact pt to arrange f/u procedure for stent removal.  Call if questions.  Nina Pitts, M.D. [phone]

## 2013-02-11 NOTE — Progress Notes (Signed)
Pt discharged to home

## 2013-02-13 NOTE — Discharge Summary (Signed)
Physician Discharge Summary  Patient ID: Nina Pitts MRN: [id number] DOB/AGE: 02/18/1961 52 y.o.  Admit date: 02/08/2013 Discharge date: 02/13/2013  Admission Diagnoses: S/p lap chole with fever Obesity  Discharge Diagnoses:  Possible retained stone s/p ercp with stent  Discharged Condition: fair  Hospital Course: 37 yof who underwent laparoscopic cholecystectomy with normal lfts on 8/21.  Her gallbladder was consistent with chronic cholecystitis at the time.  She was discharged the following day. She returned to the ER on the 26th with a fever and no real abdominal pain.  On evaluation she was noted to have what appeared to be surgicel and small amount of fluid in gallbladder bed with normal labs except mild elevation of wbc.  I had her undergo hida scan which showed a possible small leak in gallbladder bed. She then underwent ercp with Dr Ewing Schlein that could not demonstrate leak but he thought she may have actually had a cbd stone (see his report for details).  She underwent sphincterotomy and stent placement.  After that she has done well.  She is eating, no abdominal pain. She will be discharged to home  Consults: GI  Significant Diagnostic Studies: nuclear medicine: hida scan  Treatments: procedures: ERCP with biliary stent placement   Disposition: 01-Home or Self Care   Future Appointments Provider Department Dept Phone   02/18/2013 11:20 AM [name], MD [facility], [place] [phone]       Medication List         ALIGN 4 MG Caps  Take 1 capsule by mouth daily.     aspirin 81 MG chewable tablet  Chew 81 mg by mouth daily.     GAS-X PO  Take 1 tablet by mouth every 8 (eight) hours as needed. For gas     levothyroxine 200 MCG tablet  Commonly known as:  SYNTHROID, LEVOTHROID  Take 200 mcg by mouth daily before breakfast.     LORazepam 0.5 MG tablet  Commonly known as:  ATIVAN  Take 0.5 mg by mouth 2 (two) times daily as needed for anxiety.     losartan 50 MG tablet  Commonly known as:  COZAAR  Take 50 mg by mouth daily.     metaxalone 800 MG tablet  Commonly known as:  SKELAXIN  Take 800 mg by mouth 3 (three) times daily as needed for pain.     multivitamin tablet  Take 1 tablet by mouth daily.     omeprazole 20 MG capsule  Commonly known as:  PRILOSEC  Take 1 capsule (20 mg total) by mouth daily.     oxyCODONE 5 MG immediate release tablet  Commonly known as:  Oxy IR/ROXICODONE  Take 1 tablet (5 mg total) by mouth every 4 (four) hours as needed.     oxyCODONE 5 MG immediate release tablet  Commonly known as:  Oxy IR/ROXICODONE  Take 1-2 tablets (5-10 mg total) by mouth every 6 (six) hours as needed for pain.     polyethylene glycol packet  Commonly known as:  MIRALAX / GLYCOLAX  Take 17 g by mouth daily.           Follow-up Information   Follow up with [facility], MD In 1 week.   Specialty:  General Surgery   Contact information:   [address] [place] [address] [phone]       Signed: [name] 02/13/2013, 5:06 PM

## 2013-02-18 ENCOUNTER — Encounter (INDEPENDENT_AMBULATORY_CARE_PROVIDER_SITE_OTHER): Payer: Self-pay | Admitting: General Surgery

## 2013-02-18 ENCOUNTER — Ambulatory Visit (INDEPENDENT_AMBULATORY_CARE_PROVIDER_SITE_OTHER): Payer: Medicare Other | Admitting: General Surgery

## 2013-02-18 VITALS — BP 134/72 | HR 65 | Temp 98.0°F | Resp 18 | Ht 64.0 in | Wt 235.0 lb

## 2013-02-18 DIAGNOSIS — Z09 Encounter for follow-up examination after completed treatment for conditions other than malignant neoplasm: Secondary | ICD-10-CM

## 2013-02-18 NOTE — Progress Notes (Signed)
Subjective:     Patient ID: Nina Pitts, female   DOB: April 24, 1961, 52 y.o.   MRN: [id number]  HPI 24 yof s/p lap chole who had fever and was readmitted.  HIDA scan showed possible leak in gb fossa.  Underwent ercp that did not show leak but Dr Ewing Schlein thought she may have had a distal stone.  She had stent placed and did much better after that.  She returns today doing well without any real complaints. She has no fevers, eating well, no n/v/d.  She is having bowel movements.  She is being treated for thrombophlebitis in her left ac fossa where she had an iv and this is improving.  Review of Systems     Objective:   Physical Exam Abdominal incisions well healed without infection, abdomen soft nontender    Assessment:     S/p lap chole S/p stent     Plan:     She is returning to normal. I told her she had no more restrictions.  I can see back as needed.  She knows she will need to see Dr Ewing Schlein for stent removal and we will contact his office.

## 2013-03-02 ENCOUNTER — Encounter (INDEPENDENT_AMBULATORY_CARE_PROVIDER_SITE_OTHER): Payer: Self-pay

## 2013-03-17 ENCOUNTER — Other Ambulatory Visit: Payer: Self-pay | Admitting: Gastroenterology

## 2013-03-17 NOTE — Addendum Note (Signed)
Addended byVida Rigger on: 03/17/2013 11:37 AM   Modules accepted: Orders

## 2013-03-21 ENCOUNTER — Encounter (HOSPITAL_COMMUNITY): Payer: Self-pay | Admitting: Emergency Medicine

## 2013-03-21 ENCOUNTER — Emergency Department (HOSPITAL_COMMUNITY)
Admission: EM | Admit: 2013-03-21 | Discharge: 2013-03-21 | Disposition: A | Discharge status: Home / Self Care | Payer: No Typology Code available for payment source | Attending: Emergency Medicine | Admitting: Emergency Medicine

## 2013-03-21 DIAGNOSIS — IMO0002 Reserved for concepts with insufficient information to code with codable children: Secondary | ICD-10-CM | POA: Insufficient documentation

## 2013-03-21 DIAGNOSIS — Z7982 Long term (current) use of aspirin: Secondary | ICD-10-CM | POA: Insufficient documentation

## 2013-03-21 DIAGNOSIS — M1710 Unilateral primary osteoarthritis, unspecified knee: Secondary | ICD-10-CM | POA: Insufficient documentation

## 2013-03-21 DIAGNOSIS — S139XXA Sprain of joints and ligaments of unspecified parts of neck, initial encounter: Secondary | ICD-10-CM

## 2013-03-21 DIAGNOSIS — E669 Obesity, unspecified: Secondary | ICD-10-CM | POA: Insufficient documentation

## 2013-03-21 DIAGNOSIS — S4980XA Other specified injuries of shoulder and upper arm, unspecified arm, initial encounter: Secondary | ICD-10-CM | POA: Insufficient documentation

## 2013-03-21 DIAGNOSIS — I10 Essential (primary) hypertension: Secondary | ICD-10-CM | POA: Insufficient documentation

## 2013-03-21 DIAGNOSIS — F411 Generalized anxiety disorder: Secondary | ICD-10-CM | POA: Insufficient documentation

## 2013-03-21 DIAGNOSIS — Z8669 Personal history of other diseases of the nervous system and sense organs: Secondary | ICD-10-CM | POA: Insufficient documentation

## 2013-03-21 DIAGNOSIS — IMO0001 Reserved for inherently not codable concepts without codable children: Secondary | ICD-10-CM | POA: Insufficient documentation

## 2013-03-21 DIAGNOSIS — M109 Gout, unspecified: Secondary | ICD-10-CM | POA: Insufficient documentation

## 2013-03-21 DIAGNOSIS — K219 Gastro-esophageal reflux disease without esophagitis: Secondary | ICD-10-CM | POA: Insufficient documentation

## 2013-03-21 DIAGNOSIS — Y9389 Activity, other specified: Secondary | ICD-10-CM | POA: Insufficient documentation

## 2013-03-21 DIAGNOSIS — S46909A Unspecified injury of unspecified muscle, fascia and tendon at shoulder and upper arm level, unspecified arm, initial encounter: Secondary | ICD-10-CM | POA: Insufficient documentation

## 2013-03-21 DIAGNOSIS — E039 Hypothyroidism, unspecified: Secondary | ICD-10-CM | POA: Insufficient documentation

## 2013-03-21 DIAGNOSIS — Z79899 Other long term (current) drug therapy: Secondary | ICD-10-CM | POA: Insufficient documentation

## 2013-03-21 DIAGNOSIS — Y92410 Unspecified street and highway as the place of occurrence of the external cause: Secondary | ICD-10-CM | POA: Insufficient documentation

## 2013-03-21 DIAGNOSIS — Z87891 Personal history of nicotine dependence: Secondary | ICD-10-CM | POA: Insufficient documentation

## 2013-03-21 MED ORDER — BACLOFEN 20 MG PO TABS: 20.0000 mg | ORAL_TABLET | Freq: Three times a day (TID) | ORAL | Status: DC

## 2013-03-21 MED ORDER — OXYCODONE-ACETAMINOPHEN 5-325 MG PO TABS: 1.0000 | ORAL_TABLET | ORAL | Status: DC | PRN

## 2013-03-21 MED ORDER — OXYCODONE-ACETAMINOPHEN 5-325 MG PO TABS
2.0000 | ORAL_TABLET | Freq: Once | ORAL | Status: AC
  Administered 2013-03-21: 2 via ORAL
  Filled 2013-03-21: qty 2

## 2013-03-21 MED ORDER — IBUPROFEN 800 MG PO TABS: 800.0000 mg | ORAL_TABLET | Freq: Three times a day (TID) | ORAL | Status: DC
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
## 2013-03-25 NOTE — ED Provider Notes (Signed)
Medical screening examination/treatment/procedure(s) were performed by non-physician practitioner and as supervising physician I was immediately available for consultation/collaboration.  Jaelynn Pozo R. Chaitanya Amedee, MD 03/25/13 1022 

## 2013-03-28 ENCOUNTER — Encounter (HOSPITAL_COMMUNITY): Payer: Self-pay

## 2013-03-28 ENCOUNTER — Encounter (HOSPITAL_COMMUNITY)
Admission: RE | Disposition: A | Discharge status: Home / Self Care | Payer: Self-pay | Source: Ambulatory Visit | Attending: Gastroenterology

## 2013-03-28 ENCOUNTER — Ambulatory Visit (HOSPITAL_COMMUNITY)
Admission: RE | Admit: 2013-03-28 | Discharge: 2013-03-28 | Disposition: A | Discharge status: Home / Self Care | Payer: Medicare Other | Source: Ambulatory Visit | Attending: Gastroenterology | Admitting: Gastroenterology

## 2013-03-28 ENCOUNTER — Ambulatory Visit (HOSPITAL_COMMUNITY): Payer: Medicare Other

## 2013-03-28 DIAGNOSIS — Z9089 Acquired absence of other organs: Secondary | ICD-10-CM | POA: Insufficient documentation

## 2013-03-28 DIAGNOSIS — Y836 Removal of other organ (partial) (total) as the cause of abnormal reaction of the patient, or of later complication, without mention of misadventure at the time of the procedure: Secondary | ICD-10-CM | POA: Insufficient documentation

## 2013-03-28 DIAGNOSIS — K929 Disease of digestive system, unspecified: Secondary | ICD-10-CM | POA: Insufficient documentation

## 2013-03-28 HISTORY — PX: ESOPHAGOGASTRODUODENOSCOPY: SHX5428

## 2013-03-28 SURGERY — EGD (ESOPHAGOGASTRODUODENOSCOPY)
Anesthesia: Moderate Sedation

## 2013-03-28 MED ORDER — MIDAZOLAM HCL 10 MG/2ML IJ SOLN
INTRAMUSCULAR | Status: AC
  Filled 2013-03-28: qty 2

## 2013-03-28 MED ORDER — FENTANYL CITRATE 0.05 MG/ML IJ SOLN
INTRAMUSCULAR | Status: AC
  Filled 2013-03-28: qty 2

## 2013-03-28 MED ORDER — FENTANYL CITRATE 0.05 MG/ML IJ SOLN
INTRAMUSCULAR | Status: DC | PRN
  Administered 2013-03-28 (×3): 25 ug via INTRAVENOUS

## 2013-03-28 MED ORDER — SODIUM CHLORIDE 0.9 % IV SOLN
INTRAVENOUS | Status: DC
  Administered 2013-03-28: 500 mL via INTRAVENOUS

## 2013-03-28 MED ORDER — SODIUM CHLORIDE 0.9 % IV SOLN: INTRAVENOUS | Status: DC

## 2013-03-28 MED ORDER — MIDAZOLAM HCL 10 MG/2ML IJ SOLN
INTRAMUSCULAR | Status: DC | PRN
  Administered 2013-03-28: 1 mg via INTRAVENOUS
  Administered 2013-03-28 (×2): 2 mg via INTRAVENOUS

## 2013-03-28 MED ORDER — BUTAMBEN-TETRACAINE-BENZOCAINE 2-2-14 % EX AERO
INHALATION_SPRAY | CUTANEOUS | Status: DC | PRN
  Administered 2013-03-28: 2 via TOPICAL

## 2013-03-28 NOTE — Op Note (Signed)
[facility] [address] [place], [address]   ENDOSCOPY PROCEDURE REPORT  PATIENT: Nina Pitts, Nina Pitts  MR#: [id number] BIRTHDATE: 1960-10-20 , 52  yrs. old GENDER: Female  ENDOSCOPIST: Vida Rigger, MD REFERRED XB:[id number] Terri Piedra, M.D.  PROCEDURE DATE:  03/28/2013 PROCEDURE:   EGD, diagnostic ASA CLASS:   Class II INDICATIONS:biliary stent removal.  placed after laparoscopic cholecystectomy leak  MEDICATIONS: Fentanyl 75 mcg IV and Versed 5 mg IV  TOPICAL ANESTHETIC:used  DESCRIPTION OF PROCEDURE:   After the risks benefits and alternatives of the procedure were thoroughly explained, informed consent was obtained.  The Pentax Gastroscope [id number]  endoscope was introduced through the mouth and advanced to the third portion of the duodenum , limited by Without limitations.   The instrument was slowly withdrawn as the mucosa was fully examined.the findings are recorded below and using the straight viewing scope we did not see any signs of the biliary stent including a quick look at the ampulla and after the endoscopy was completed we went ahead and remove the regular endoscope and placed the side-viewing ERCP scope and advanced it in the customary fashion into the duodenum and again a normal-appearing ampulla was brought into view and no stent was seen and the scope was removed and the patient tolerated the procedure well there was no obvious immediate complication              FINDINGS:1. Small hiatal hernia with a widely patent fibrous ring 2. Mild to moderate distal antritis and widely patent pylorus 3. Otherwise within normal limits to the third part of the duodenum without biliary stent being with good look at the ampulla using the straight and side viewing scope as above  COMPLICATIONS: none  ENDOSCOPIC IMPRESSION: above   RECOMMENDATIONS:will get an x-ray today and make sure the stent has not migrated into the duct otherwise she will  call me when necessary and followup in one month to discuss the timing of repeat screening colonoscopy   REPEAT EXAM: as needed   _______________________________ Vida Rigger, MD eSigned:  Vida Rigger, MD 03/28/2013 9:11 AM    FA:[id number], Arlys John DO Romero Liner, MD  PATIENT NAME:  Nina Pitts, Nina Pitts MR#: [id number]

## 2013-03-28 NOTE — OR Nursing (Signed)
Pt taken to radiology for x-ray at 0945.  Anthony Sar RN  03/28/2013  10:15 AM

## 2013-03-30 ENCOUNTER — Encounter (HOSPITAL_COMMUNITY): Payer: Self-pay | Admitting: Gastroenterology

## 2013-04-21 ENCOUNTER — Other Ambulatory Visit: Payer: Self-pay

## 2013-09-09 ENCOUNTER — Encounter: Payer: Self-pay | Admitting: Internal Medicine

## 2013-12-02 ENCOUNTER — Other Ambulatory Visit: Payer: Self-pay | Admitting: Internal Medicine

## 2014-07-19 DIAGNOSIS — J069 Acute upper respiratory infection, unspecified: Secondary | ICD-10-CM | POA: Diagnosis not present

## 2014-07-19 DIAGNOSIS — H6691 Otitis media, unspecified, right ear: Secondary | ICD-10-CM | POA: Diagnosis not present

## 2014-07-19 DIAGNOSIS — E039 Hypothyroidism, unspecified: Secondary | ICD-10-CM | POA: Diagnosis not present

## 2014-08-02 DIAGNOSIS — E039 Hypothyroidism, unspecified: Secondary | ICD-10-CM | POA: Diagnosis not present

## 2014-08-02 DIAGNOSIS — N390 Urinary tract infection, site not specified: Secondary | ICD-10-CM | POA: Diagnosis not present

## 2014-08-02 DIAGNOSIS — R300 Dysuria: Secondary | ICD-10-CM | POA: Diagnosis not present

## 2014-08-11 ENCOUNTER — Other Ambulatory Visit: Payer: Self-pay | Admitting: Gastroenterology

## 2014-08-11 DIAGNOSIS — Z1210 Encounter for screening for malignant neoplasm of intestinal tract, unspecified: Secondary | ICD-10-CM | POA: Diagnosis not present

## 2014-08-11 DIAGNOSIS — R109 Unspecified abdominal pain: Secondary | ICD-10-CM

## 2014-08-11 DIAGNOSIS — K562 Volvulus: Secondary | ICD-10-CM | POA: Diagnosis not present

## 2014-08-11 DIAGNOSIS — R194 Change in bowel habit: Secondary | ICD-10-CM | POA: Diagnosis not present

## 2014-08-15 ENCOUNTER — Ambulatory Visit
Admission: RE | Admit: 2014-08-15 | Discharge: 2014-08-15 | Disposition: A | Discharge status: Home / Self Care | Payer: Medicare Other | Source: Ambulatory Visit | Attending: Gastroenterology | Admitting: Gastroenterology

## 2014-08-15 DIAGNOSIS — K429 Umbilical hernia without obstruction or gangrene: Secondary | ICD-10-CM | POA: Diagnosis not present

## 2014-08-15 DIAGNOSIS — R109 Unspecified abdominal pain: Secondary | ICD-10-CM

## 2014-08-15 DIAGNOSIS — Z9049 Acquired absence of other specified parts of digestive tract: Secondary | ICD-10-CM | POA: Diagnosis not present

## 2014-08-15 DIAGNOSIS — K439 Ventral hernia without obstruction or gangrene: Secondary | ICD-10-CM | POA: Diagnosis not present

## 2014-08-15 MED ORDER — IOHEXOL 300 MG/ML  SOLN
125.0000 mL | Freq: Once | INTRAMUSCULAR | Status: AC | PRN
  Administered 2014-08-15: 125 mL via INTRAVENOUS

## 2014-08-22 DIAGNOSIS — R109 Unspecified abdominal pain: Secondary | ICD-10-CM | POA: Diagnosis not present

## 2014-08-22 DIAGNOSIS — E039 Hypothyroidism, unspecified: Secondary | ICD-10-CM | POA: Diagnosis not present

## 2014-08-30 DIAGNOSIS — K5730 Diverticulosis of large intestine without perforation or abscess without bleeding: Secondary | ICD-10-CM | POA: Diagnosis not present

## 2014-08-30 DIAGNOSIS — Z1211 Encounter for screening for malignant neoplasm of colon: Secondary | ICD-10-CM | POA: Diagnosis not present

## 2014-10-10 ENCOUNTER — Encounter (HOSPITAL_COMMUNITY): Payer: Self-pay | Admitting: Emergency Medicine

## 2014-10-10 ENCOUNTER — Emergency Department (HOSPITAL_COMMUNITY)
Admission: EM | Admit: 2014-10-10 | Discharge: 2014-10-10 | Disposition: A | Discharge status: Home / Self Care | Payer: Medicare Other | Attending: Emergency Medicine | Admitting: Emergency Medicine

## 2014-10-10 DIAGNOSIS — W231XXA Caught, crushed, jammed, or pinched between stationary objects, initial encounter: Secondary | ICD-10-CM | POA: Diagnosis not present

## 2014-10-10 DIAGNOSIS — Z87891 Personal history of nicotine dependence: Secondary | ICD-10-CM | POA: Insufficient documentation

## 2014-10-10 DIAGNOSIS — Y939 Activity, unspecified: Secondary | ICD-10-CM | POA: Insufficient documentation

## 2014-10-10 DIAGNOSIS — E669 Obesity, unspecified: Secondary | ICD-10-CM | POA: Insufficient documentation

## 2014-10-10 DIAGNOSIS — Z79899 Other long term (current) drug therapy: Secondary | ICD-10-CM | POA: Diagnosis not present

## 2014-10-10 DIAGNOSIS — K219 Gastro-esophageal reflux disease without esophagitis: Secondary | ICD-10-CM | POA: Insufficient documentation

## 2014-10-10 DIAGNOSIS — F419 Anxiety disorder, unspecified: Secondary | ICD-10-CM | POA: Insufficient documentation

## 2014-10-10 DIAGNOSIS — R202 Paresthesia of skin: Secondary | ICD-10-CM | POA: Insufficient documentation

## 2014-10-10 DIAGNOSIS — S6992XA Unspecified injury of left wrist, hand and finger(s), initial encounter: Secondary | ICD-10-CM | POA: Diagnosis present

## 2014-10-10 DIAGNOSIS — S60012A Contusion of left thumb without damage to nail, initial encounter: Secondary | ICD-10-CM | POA: Insufficient documentation

## 2014-10-10 DIAGNOSIS — Z9889 Other specified postprocedural states: Secondary | ICD-10-CM | POA: Diagnosis not present

## 2014-10-10 DIAGNOSIS — I10 Essential (primary) hypertension: Secondary | ICD-10-CM | POA: Diagnosis not present

## 2014-10-10 DIAGNOSIS — Y929 Unspecified place or not applicable: Secondary | ICD-10-CM | POA: Insufficient documentation

## 2014-10-10 DIAGNOSIS — M179 Osteoarthritis of knee, unspecified: Secondary | ICD-10-CM | POA: Insufficient documentation

## 2014-10-10 DIAGNOSIS — Z7982 Long term (current) use of aspirin: Secondary | ICD-10-CM | POA: Insufficient documentation

## 2014-10-10 DIAGNOSIS — Z791 Long term (current) use of non-steroidal anti-inflammatories (NSAID): Secondary | ICD-10-CM | POA: Insufficient documentation

## 2014-10-10 DIAGNOSIS — E039 Hypothyroidism, unspecified: Secondary | ICD-10-CM | POA: Insufficient documentation

## 2014-10-10 DIAGNOSIS — Z8669 Personal history of other diseases of the nervous system and sense organs: Secondary | ICD-10-CM | POA: Diagnosis not present

## 2014-10-10 DIAGNOSIS — Y999 Unspecified external cause status: Secondary | ICD-10-CM | POA: Diagnosis not present

## 2014-10-10 MED ORDER — IBUPROFEN 400 MG PO TABS
400.0000 mg | ORAL_TABLET | Freq: Once | ORAL | Status: AC
  Administered 2014-10-10: 400 mg via ORAL
  Filled 2014-10-10: qty 1

## 2014-10-10 MED ORDER — IBUPROFEN 800 MG PO TABS: 800.0000 mg | ORAL_TABLET | Freq: Three times a day (TID) | ORAL | Status: DC | PRN

## 2014-10-10 NOTE — ED Provider Notes (Signed)
CSN: [id number]     Arrival date & time 10/10/14  1638 History  This chart was scribed for non-physician practitioner, Fayrene HelperBowie Kynedi Profitt, PA-C working with Richardean Canalavid H Yao, MD by Gwenyth Oberatherine Macek, ED scribe. This patient was seen in room TR07C/TR07C and the patient's care was started at 5:14 PM   Chief Complaint  Patient presents with  . Hand Injury   The history is provided by the patient. No language interpreter was used.   HPI Comments: Nina Pitts is a [age] y.o. female who presents to the Emergency Department complaining of constant, 8/10, sharp left thumb pain that radiates to her left shoulder and left neck and started 2 hours ago after she jammed her thumb straight into her stove. She states tingling of her left thumb that occurs with palpation as an associated symptom. Pt has not tried any treatment for her symptoms PTA. She is right-handed. Pt denies numbness and weakness.  Past Medical History  Diagnosis Date  . Anxiety   . Fibromyalgia   . Gallstones   . Hypertension   . IBS (irritable bowel syndrome)   . Obesity   . GERD (gastroesophageal reflux disease)   . Hypothyroidism   . Carpal tunnel syndrome   . Arthritis     "joints" (02/03/2013)  . Osteoarthritis of both knees   . Gout attack ~ 2011  . PTSD (post-traumatic stress disorder)   . Fatty liver disease, nonalcoholic dx'd "~ 4/09814/2014"   Past Surgical History  Procedure Laterality Date  . Cesarean section  1979; 1980; 1986  . [facility]ia repair  ~ [facility]    [facility]   . Axillary hidradenitis excision Bilateral 1990's    with stsg  . Pilonidal cyst excision  2000?  Marland Kitchen. Cholecystectomy  02/03/2013  . Knee arthroscopy Right 1990'S  . Abdominal exploration surgery  04/27/2001    w/LOA/notes 04/27/2001 (02/03/2013)  . Bilateral salpingoophorectomy Bilateral 04/27/2001    Ashlley Perch/notes 04/27/2001 (02/03/2013)  . Appendectomy  ~ 1974  . Abdominal hysterectomy  ~ 2000    partial  . Cardiac catheterization  ~ 2005  . Cholecystectomy N/A 02/03/2013   Procedure: LAPAROSCOPIC CHOLECYSTECTOMY WITH INTRAOPERATIVE CHOLANGIOGRAM;  Surgeon: Emelia LoronMatthew Wakefield, MD;  [facility];  Service: General;  Laterality: N/A;  . Ercp N/A 02/09/2013    Procedure: ENDOSCOPIC RETROGRADE CHOLANGIOPANCREATOGRAPHY (ERCP) with Stent Placement;  Surgeon: Petra KubaMarc E Magod, MD;  [facility];  Service: Endoscopy;  Laterality: N/A;  . Esophagogastroduodenoscopy N/A 03/28/2013    Procedure: ESOPHAGOGASTRODUODENOSCOPY (EGD);  Surgeon: Petra KubaMarc E Magod, MD;  [facility];  Service: Endoscopy;  Laterality: N/A;   Family History  Problem Relation Age of Onset  . Pancreatic cancer Father   . Esophageal cancer Father   . Cancer Father     esophagus, lungs  . Diabetes Mother   . Heart disease Mother   . Cancer Paternal Grandmother     breast   History  Substance Use Topics  . Smoking status: Former Smoker -- 0.50 packs/day for 15 years    Types: Cigarettes    Quit date: 11/18/1998  . Smokeless tobacco: Never Used  . Alcohol Use: No   OB History    No data available     Review of Systems  Musculoskeletal: Positive for arthralgias. Negative for joint swelling.  Neurological: Negative for weakness and numbness.      Allergies  Percocet; Aspirin; Robaxin; and Vicodin  Home Medications   Prior to Admission medications   Medication Sig Start Date End Date  Taking? Authorizing Provider  aspirin 81 MG chewable tablet Chew 81 mg by mouth daily.    Historical Provider, MD  atorvastatin (LIPITOR) 20 MG tablet Take 20 mg by mouth daily.  02/16/13   Historical Provider, MD  baclofen (LIORESAL) 20 MG tablet Take 1 tablet (20 mg total) by mouth 3 (three) times daily. 03/21/13   Arthor Captain, PA-C  ibuprofen (ADVIL,MOTRIN) 800 MG tablet Take 1 tablet (800 mg total) by mouth 3 (three) times daily. 03/21/13   Arthor Captain, PA-C  levothyroxine (SYNTHROID, LEVOTHROID) 200 MCG tablet Take 200 mcg by mouth daily before breakfast.    Historical Provider, MD  LORazepam  (ATIVAN) 0.5 MG tablet Take 0.5 mg by mouth 2 (two) times daily as needed for anxiety.     Historical Provider, MD  losartan (COZAAR) 50 MG tablet Take 50 mg by mouth daily.    Historical Provider, MD  metaxalone (SKELAXIN) 800 MG tablet Take 800 mg by mouth 3 (three) times daily as needed for pain.     Historical Provider, MD  Multiple Vitamin (MULTIVITAMIN) tablet Take 1 tablet by mouth daily.    Historical Provider, MD  omeprazole (PRILOSEC) 20 MG capsule TAKE 1 CAPSULE BY MOUTH DAILY.    [name], MD  oxyCODONE-acetaminophen (PERCOCET/ROXICET) 5-325 MG per tablet Take 1-2 tablets by mouth every 4 (four) hours as needed for pain. 03/21/13   Abigail Harris, PA-C   BP 133/79 mmHg  Pulse 74  Temp(Src) 98.6 F (37 C) (Oral)  Resp 22  SpO2 99% Physical Exam  Constitutional: She appears well-developed and well-nourished. No distress.  HENT:  Head: Normocephalic and atraumatic.  Eyes: Conjunctivae and EOM are normal.  Neck: Neck supple. No tracheal deviation present.  Cardiovascular: Normal rate.   Pulmonary/Chest: Effort normal. No respiratory distress.  Musculoskeletal: Normal range of motion. She exhibits tenderness.  Left thumb tenderness noted to distal phalanx on palpation; thumb with full ROM; no bruising, no gross deformity; brisk cap refill at distal tip noted; no nail involvement; left wrist with full ROM; radial pulses 2+  Skin: Skin is warm and dry.  Psychiatric: She has a normal mood and affect. Her behavior is normal.  Nursing note and vitals reviewed.   ED Course  Procedures   DIAGNOSTIC STUDIES: Oxygen Saturation is 99% on RA, normal by my interpretation.    COORDINATION OF CARE: 5:20 PM Will not obtain x-ray because pt has good blood flow, no obvious injury and no dislocation. Discussed treatment plan with pt which includes applying ice and Ibuprofen. Pt agreed to plan.   Labs Review Labs Reviewed - No data to display  Imaging Review No results found.   EKG  Interpretation None      MDM   Final diagnoses:  Thumb contusion, left, initial encounter    BP 133/79 mmHg  Pulse 74  Temp(Src) 98.6 F (37 C) (Oral)  Resp 22  SpO2 99%   I personally performed the services described in this documentation, which was scribed in my presence. The recorded information has been reviewed and is accurate.     Fayrene Helper, PA-C 10/10/14 1723  Richardean Canal, MD 10/10/14 [phone]

## 2014-10-10 NOTE — Discharge Instructions (Signed)
RICE: Routine Care for Injuries The routine care of many injuries includes Rest, Ice, Compression, and Elevation (RICE). HOME CARE INSTRUCTIONS  Rest is needed to allow your body to heal. Routine activities can usually be resumed when comfortable. Injured tendons and bones can take up to 6 weeks to heal. Tendons are the cord-like structures that attach muscle to bone.  Ice following an injury helps keep the swelling down and reduces pain.  Put ice in a plastic bag.  Place a towel between your skin and the bag.  Leave the ice on for 15-20 minutes, 3-4 times a day, or as directed by your health care provider. Do this while awake, for the first 24 to 48 hours. After that, continue as directed by your caregiver.  Compression helps keep swelling down. It also gives support and helps with discomfort. If an elastic bandage has been applied, it should be removed and reapplied every 3 to 4 hours. It should not be applied tightly, but firmly enough to keep swelling down. Watch fingers or toes for swelling, bluish discoloration, coldness, numbness, or excessive pain. If any of these problems occur, remove the bandage and reapply loosely. Contact your caregiver if these problems continue.  Elevation helps reduce swelling and decreases pain. With extremities, such as the arms, hands, legs, and feet, the injured area should be placed near or above the level of the heart, if possible. SEEK IMMEDIATE MEDICAL CARE IF:  You have persistent pain and swelling.  You develop redness, numbness, or unexpected weakness.  Your symptoms are getting worse rather than improving after several days. These symptoms may indicate that further evaluation or further X-rays are needed. Sometimes, X-rays may not show a small broken bone (fracture) until 1 week or 10 days later. Make a follow-up appointment with your caregiver. Ask when your X-ray results will be ready. Make sure you get your X-ray results. Document Released:  09/14/2000 Document Revised: 06/07/2013 Document Reviewed: 11/01/2010 ExitCare Patient Information 2015 [employer]. This information is not intended to replace advice given to you by your health care provider. Make sure you discuss any questions you have with your health care provider.  

## 2014-10-10 NOTE — ED Notes (Signed)
Pt "jammed her thumb" while reaching into a drawer 2 hours ago. Experiencing shooting pain in right arm. NO CP, SOB. Ambulatory at triage

## 2014-11-07 DIAGNOSIS — I10 Essential (primary) hypertension: Secondary | ICD-10-CM | POA: Diagnosis not present

## 2014-11-07 DIAGNOSIS — Z0000 Encounter for general adult medical examination without abnormal findings: Secondary | ICD-10-CM | POA: Diagnosis not present

## 2014-11-07 DIAGNOSIS — E780 Pure hypercholesterolemia: Secondary | ICD-10-CM | POA: Diagnosis not present

## 2014-11-07 DIAGNOSIS — E039 Hypothyroidism, unspecified: Secondary | ICD-10-CM | POA: Diagnosis not present

## 2015-01-04 DIAGNOSIS — N390 Urinary tract infection, site not specified: Secondary | ICD-10-CM | POA: Diagnosis not present

## 2015-01-04 DIAGNOSIS — E039 Hypothyroidism, unspecified: Secondary | ICD-10-CM | POA: Diagnosis not present

## 2015-01-04 DIAGNOSIS — Z0000 Encounter for general adult medical examination without abnormal findings: Secondary | ICD-10-CM | POA: Diagnosis not present

## 2015-01-04 DIAGNOSIS — I10 Essential (primary) hypertension: Secondary | ICD-10-CM | POA: Diagnosis not present

## 2015-01-04 DIAGNOSIS — E780 Pure hypercholesterolemia: Secondary | ICD-10-CM | POA: Diagnosis not present

## 2015-02-15 ENCOUNTER — Ambulatory Visit: Payer: Self-pay | Admitting: Interventional Cardiology

## 2015-03-21 ENCOUNTER — Ambulatory Visit (INDEPENDENT_AMBULATORY_CARE_PROVIDER_SITE_OTHER): Payer: Medicare Other | Admitting: Interventional Cardiology

## 2015-03-21 ENCOUNTER — Encounter: Payer: Self-pay | Admitting: Interventional Cardiology

## 2015-03-21 VITALS — BP 150/84 | HR 63 | Ht 64.0 in | Wt 241.8 lb

## 2015-03-21 DIAGNOSIS — I10 Essential (primary) hypertension: Secondary | ICD-10-CM | POA: Diagnosis not present

## 2015-03-21 DIAGNOSIS — E669 Obesity, unspecified: Secondary | ICD-10-CM

## 2015-03-21 DIAGNOSIS — R9431 Abnormal electrocardiogram [ECG] [EKG]: Secondary | ICD-10-CM | POA: Diagnosis not present

## 2015-03-21 DIAGNOSIS — I700 Atherosclerosis of aorta: Secondary | ICD-10-CM | POA: Diagnosis not present

## 2015-03-21 NOTE — Progress Notes (Signed)
Cardiology Office Note   Date:  03/21/2015   ID:  Nina Pitts, DOB 1961-04-22, MRN [id number]  PCP:  Londell Moh, MD  Cardiologist:  Lesleigh Noe, MD   Chief Complaint  Patient presents with  . Chest Pain      History of Present Illness: Nina Pitts is a 54 y.o. female who presents for aortic arteriosclerosis, history of hypertension, hyperlipidemia, prior normal coronary angiogram 2006.  Referred from [facility] for cardiac evaluation. She is asymptomatic. Arteriosclerosis of the aorta has been noted on CT scans. There has been some question of cardiac enlargement on chest x-rays. There was concern of an abnormal EKG when seen by the extender, Nina Pitts. These concerns led to evaluation. She specifically denies chest discomfort, dyspnea, palpitations, swelling, orthopnea, PND, and syncope. I have no record of the specific EKG abnormality noted.    Past Medical History  Diagnosis Date  . Anxiety   . Fibromyalgia   . Gallstones   . Hypertension   . IBS (irritable bowel syndrome)   . Obesity   . GERD (gastroesophageal reflux disease)   . Hypothyroidism   . Carpal tunnel syndrome   . Arthritis     "joints" (02/03/2013)  . Osteoarthritis of both knees   . Gout attack ~ 2011  . PTSD (post-traumatic stress disorder)   . Fatty liver disease, nonalcoholic dx'd "~ 02/6294"    Past Surgical History  Procedure Laterality Date  . Cesarean section  1979; 1980; 1986  . [facility]  [facility]   . Axillary hidradenitis excision Bilateral 1990's    with stsg  . Pilonidal cyst excision  2000?  Marland Kitchen Cholecystectomy  02/03/2013  . Knee arthroscopy Right 1990'S  . Abdominal exploration surgery  04/27/2001    w/LOA/notes 04/27/2001 (02/03/2013)  . Bilateral salpingoophorectomy Bilateral 04/27/2001    Nina Pitts 04/27/2001 (02/03/2013)  . Appendectomy  ~ 1974  . Abdominal hysterectomy  ~ 2000    partial  . Cardiac catheterization  ~ 2005    . Cholecystectomy N/A 02/03/2013    Procedure: LAPAROSCOPIC CHOLECYSTECTOMY WITH INTRAOPERATIVE CHOLANGIOGRAM;  Surgeon: [name], MD;  Location: [facility] OR;  Service: General;  Laterality: N/A;  . Ercp N/A 02/09/2013    Procedure: ENDOSCOPIC RETROGRADE CHOLANGIOPANCREATOGRAPHY (ERCP) with Stent Placement;  Surgeon: [name], MD;  Location: [facility] OR;  Service: Endoscopy;  Laterality: N/A;  . Esophagogastroduodenoscopy N/A 03/28/2013    Procedure: ESOPHAGOGASTRODUODENOSCOPY (EGD);  Surgeon: [name], MD;  Location[facility];  Service: Endoscopy;  Laterality: N/A;     Current Outpatient Prescriptions  Medication Sig Dispense Refill  . aspirin 81 MG chewable tablet Chew 81 mg by mouth daily.    Marland Kitchen atorvastatin (LIPITOR) 20 MG tablet Take 20 mg by mouth daily.     . baclofen (LIORESAL) 20 MG tablet Take 1 tablet (20 mg total) by mouth 3 (three) times daily. 30 each 0  . ibuprofen (ADVIL,MOTRIN) 800 MG tablet Take 1 tablet (800 mg total) by mouth every 8 (eight) hours as needed for moderate pain. 21 tablet 0  . levothyroxine (SYNTHROID, LEVOTHROID) 175 MCG tablet Take 175 mcg by mouth daily before breakfast.    . losartan (COZAAR) 50 MG tablet Take 50 mg by mouth daily.    . metaxalone (SKELAXIN) 800 MG tablet Take 800 mg by mouth 3 (three) times daily as needed for pain.     . Multiple Vitamin (MULTIVITAMIN) tablet Take 1 tablet by mouth  daily.    . omeprazole (PRILOSEC) 20 MG capsule TAKE 1 CAPSULE BY MOUTH DAILY. 30 capsule 11   No current facility-administered medications for this visit.    Allergies:   Percocet; Aspirin; Robaxin; and Vicodin    Social History:  The patient  reports that she quit smoking about 16 years ago. Her smoking use included Cigarettes. She has a 7.5 pack-year smoking history. She has never used smokeless tobacco. She reports that she does not drink alcohol or use illicit drugs.   Family History:  The patient's family history includes Cancer in her  father and paternal grandmother; Diabetes in her mother; Esophageal cancer in her father; Heart disease in her mother; Pancreatic cancer in her father.    ROS:  Please see the history of present illness.   Otherwise, review of systems are positive for unexplained weight gain, vision disturbance, recurring abdominal pain and diarrhea. Back discomfort with muscle spasms, dizziness, anxiety, joint swelling, difficulty with walking, occasional palpitations when the thyroid is over treated, constipation, snoring, and fatigue..   All other systems are reviewed and negative.    PHYSICAL EXAM: VS:  BP 150/84 mmHg  Pulse 63  Ht 5\' 4"  (1.626 m)  Wt 109.68 kg (241 lb 12.8 oz)  BMI 41.48 kg/m2 , BMI Body mass index is 41.48 kg/(m^2). GEN: Well nourished, well developed, in no acute distress HEENT: normal Neck: no JVD, carotid bruits, or masses Cardiac: RRR.  There is no murmur, rub, or gallop. There is no edema. Respiratory:  clear to auscultation bilaterally, normal work of breathing. GI: soft, nontender, nondistended, + BS MS: no deformity or atrophy Skin: warm and dry, no rash Neuro:  Strength and sensation are intact Psych: euthymic mood, full affect   EKG:  EKG is ordered today. The ekg reveals normal sinus rhythm with nonspecific ST abnormality   Recent Labs: No results found for requested labs within last 365 days.    Lipid Panel No results found for: CHOL, TRIG, HDL, CHOLHDL, VLDL, LDLCALC, LDLDIRECT    Wt Readings from Last 3 Encounters:  03/21/15 109.68 kg (241 lb 12.8 oz)  03/28/13 106.142 kg (234 lb)  02/18/13 106.595 kg (235 lb)      Other studies Reviewed: Additional studies/ records that were reviewed today include: Recent CT scans, chest x-ray reports, and records from [facility]. The findings include some concern about aortic arterial sclerosis, radiographic concern about cardiac enlargement, previous coronary angiogram 2006 with widely patent  arteries..    ASSESSMENT AND PLAN:  1. Abnormal ECG Abnormality found.  2. Obesity, unspecified This combined with a history of snoring raises the question of sleep apnea.  3. Aortic atherosclerosis (HCC) Calcification noted on CT scanning. Prior history of widely patent coronaries 10 years ago. On antilipid and antihypertensive therapy.  4. Essential hypertension Some x-rays have suggested cardiac enlargement. Blood pressure is not well controlled today. - EKG 12-Lead - Echocardiogram; Future    Current medicines are reviewed at length with the patient today.  The patient has the following concerns regarding medicines: None.  The following changes/actions have been instituted:    2-D Doppler echocardiogram to exclude systolic dysfunction and LVH  Continue aggressive risk factor modification. Discussed the importance of weight reduction, cholesterol management, and blood pressure therapy.  An ischemic workup is not indicated in absence of symptoms  Labs/ tests ordered today include:   Orders Placed This Encounter  Procedures  . EKG 12-Lead  . Echocardiogram     Disposition:  FU with HS in prn depending on workup.    Signed, Lesleigh Noe, MD  03/21/2015 10:12 AM    [facility] [address] [place], [place], [place]  [address] Phone: [phone]; Fax: [phone]

## 2015-03-21 NOTE — Patient Instructions (Signed)
Medication Instructions:  Your physician recommends that you continue on your current medications as directed. Please refer to the Current Medication list given to you today.   Labwork: None ordered  Testing/Procedures: Your physician has requested that you have an echocardiogram. Echocardiography is a painless test that uses sound waves to create images of your heart. It provides your doctor with information about the size and shape of your heart and how well your heart's chambers and valves are working. This procedure takes approximately one hour. There are no restrictions for this procedure.   Follow-Up: Your physician recommends that you schedule a follow-up appointment as needed   Any Other Special Instructions Will Be Listed Below (If Applicable).  Marland Kitchen

## 2015-03-28 ENCOUNTER — Other Ambulatory Visit: Payer: Self-pay

## 2015-03-28 ENCOUNTER — Ambulatory Visit (HOSPITAL_COMMUNITY): Payer: Medicare Other | Attending: Interventional Cardiology

## 2015-03-28 DIAGNOSIS — I10 Essential (primary) hypertension: Secondary | ICD-10-CM

## 2015-03-28 DIAGNOSIS — Z87891 Personal history of nicotine dependence: Secondary | ICD-10-CM | POA: Insufficient documentation

## 2015-03-28 DIAGNOSIS — R9431 Abnormal electrocardiogram [ECG] [EKG]: Secondary | ICD-10-CM | POA: Insufficient documentation

## 2015-03-28 DIAGNOSIS — E785 Hyperlipidemia, unspecified: Secondary | ICD-10-CM | POA: Insufficient documentation

## 2015-03-29 ENCOUNTER — Telehealth: Payer: Self-pay

## 2015-03-29 NOTE — Telephone Encounter (Signed)
-----   Message from Lyn RecordsHenry W Smith, MD sent at 03/29/2015  2:03 PM EDT ----- Heart size and function is normal.

## 2015-04-06 DIAGNOSIS — K219 Gastro-esophageal reflux disease without esophagitis: Secondary | ICD-10-CM | POA: Diagnosis not present

## 2015-04-06 DIAGNOSIS — K439 Ventral hernia without obstruction or gangrene: Secondary | ICD-10-CM | POA: Diagnosis not present

## 2015-04-06 DIAGNOSIS — R1084 Generalized abdominal pain: Secondary | ICD-10-CM | POA: Diagnosis not present

## 2015-04-06 DIAGNOSIS — Z6841 Body Mass Index (BMI) 40.0 and over, adult: Secondary | ICD-10-CM | POA: Diagnosis not present

## 2015-04-06 DIAGNOSIS — Z23 Encounter for immunization: Secondary | ICD-10-CM | POA: Diagnosis not present

## 2015-04-10 DIAGNOSIS — R1084 Generalized abdominal pain: Secondary | ICD-10-CM | POA: Diagnosis not present

## 2015-04-19 ENCOUNTER — Emergency Department (HOSPITAL_COMMUNITY)
Admission: EM | Admit: 2015-04-19 | Discharge: 2015-04-19 | Disposition: A | Discharge status: Home / Self Care | Payer: Medicare Other | Attending: Emergency Medicine | Admitting: Emergency Medicine

## 2015-04-19 ENCOUNTER — Encounter (HOSPITAL_COMMUNITY): Payer: Self-pay | Admitting: *Deleted

## 2015-04-19 ENCOUNTER — Emergency Department (HOSPITAL_COMMUNITY): Payer: Medicare Other

## 2015-04-19 DIAGNOSIS — M79641 Pain in right hand: Secondary | ICD-10-CM | POA: Insufficient documentation

## 2015-04-19 DIAGNOSIS — K219 Gastro-esophageal reflux disease without esophagitis: Secondary | ICD-10-CM | POA: Diagnosis not present

## 2015-04-19 DIAGNOSIS — M1990 Unspecified osteoarthritis, unspecified site: Secondary | ICD-10-CM | POA: Diagnosis not present

## 2015-04-19 DIAGNOSIS — E669 Obesity, unspecified: Secondary | ICD-10-CM | POA: Insufficient documentation

## 2015-04-19 DIAGNOSIS — I10 Essential (primary) hypertension: Secondary | ICD-10-CM | POA: Insufficient documentation

## 2015-04-19 DIAGNOSIS — Z7982 Long term (current) use of aspirin: Secondary | ICD-10-CM | POA: Insufficient documentation

## 2015-04-19 DIAGNOSIS — Z79899 Other long term (current) drug therapy: Secondary | ICD-10-CM | POA: Diagnosis not present

## 2015-04-19 DIAGNOSIS — Z791 Long term (current) use of non-steroidal anti-inflammatories (NSAID): Secondary | ICD-10-CM | POA: Insufficient documentation

## 2015-04-19 DIAGNOSIS — E039 Hypothyroidism, unspecified: Secondary | ICD-10-CM | POA: Diagnosis not present

## 2015-04-19 DIAGNOSIS — Z87891 Personal history of nicotine dependence: Secondary | ICD-10-CM | POA: Insufficient documentation

## 2015-04-19 DIAGNOSIS — S6991XA Unspecified injury of right wrist, hand and finger(s), initial encounter: Secondary | ICD-10-CM | POA: Diagnosis not present

## 2015-04-19 MED ORDER — IBUPROFEN 800 MG PO TABS: 800.0000 mg | ORAL_TABLET | Freq: Three times a day (TID) | ORAL | Status: DC

## 2015-04-19 NOTE — ED Provider Notes (Signed)
CSN: [id number]     Arrival date & time 04/19/15  1030 History  By signing my name below, I, [name], attest that this documentation has been prepared under the direction and in the presence of [name], PA-C. Electronically Signed: Angelene GiovanniEmmanuella Mensah, ED Scribe. 04/19/2015. 11:43 AM.    Chief Complaint  Patient presents with  . Hand Pain   The history is provided by the patient. No language interpreter was used.   HPI Comments: Nina Pitts is a 54 y.o. female with a hx of HTN, carpal tunnel syndrome, arthritis, or fibromyalgia who presents to the Emergency Department complaining of gradually worsening right small finger pain s/p hand injury that occurred yesterday. She reports associated partially resolved swelling and tingling. She denies any numbness or weakness. She states that she took Ibuprofen which did not relief her pain but relieved her swelling. She reports that she hit her hand on a drinking pitcher. She denies injuring this finger in the past.   Past Medical History  Diagnosis Date  . Anxiety   . Fibromyalgia   . Gallstones   . Hypertension   . IBS (irritable bowel syndrome)   . Obesity   . GERD (gastroesophageal reflux disease)   . Hypothyroidism   . Carpal tunnel syndrome   . Arthritis     "joints" (02/03/2013)  . Osteoarthritis of both knees   . Gout attack ~ 2011  . PTSD (post-traumatic stress disorder)   . Fatty liver disease, nonalcoholic dx'd "~ 4/09814/2014"   Past Surgical History  Procedure Laterality Date  . Cesarean section  1979; 1980; 1986  . [facility]ia [facility]  [facility]   . Axillary hidradenitis excision Bilateral 1990's    with stsg  . Pilonidal cyst excision  2000?  Marland Kitchen. Cholecystectomy  02/03/2013  . Knee arthroscopy Right 1990'S  . Abdominal exploration surgery  04/27/2001    w/LOA/notes 04/27/2001 (02/03/2013)  . Bilateral salpingoophorectomy Bilateral 04/27/2001    Oda Perch/notes 04/27/2001 (02/03/2013)  . Appendectomy  ~ 1974  . Abdominal  hysterectomy  ~ 2000    partial  . Cardiac catheterization  ~ 2005  . Cholecystectomy N/A 02/03/2013    Procedure: LAPAROSCOPIC CHOLECYSTECTOMY WITH INTRAOPERATIVE CHOLANGIOGRAM;  Surgeon: Emelia LoronMatthew Wakefield, MD;  [facility];  Service: General;  Laterality: N/A;  . Ercp N/A 02/09/2013    Procedure: ENDOSCOPIC RETROGRADE CHOLANGIOPANCREATOGRAPHY (ERCP) with Stent Placement;  Surgeon: Petra KubaMarc E Magod, MD;  [facility];  Service: Endoscopy;  Laterality: N/A;  . Esophagogastroduodenoscopy N/A 03/28/2013    Procedure: ESOPHAGOGASTRODUODENOSCOPY (EGD);  Surgeon: Petra KubaMarc E Magod, MD;  [facility];  Service: Endoscopy;  Laterality: N/A;   Family History  Problem Relation Age of Onset  . Pancreatic cancer Father   . Esophageal cancer Father   . Cancer Father     esophagus, lungs  . Diabetes Mother   . Heart disease Mother   . Cancer Paternal Grandmother     breast   Social History  Substance Use Topics  . Smoking status: Former Smoker -- 0.50 packs/day for 15 years    Types: Cigarettes    Quit date: 11/18/1998  . Smokeless tobacco: Never Used  . Alcohol Use: No   OB History    No data available     Review of Systems  Constitutional: Negative for fever.  Musculoskeletal: Positive for joint swelling and arthralgias.  Neurological: Negative for numbness.      Allergies  Percocet; Aspirin; Robaxin; and Vicodin  Home  Medications   Prior to Admission medications   Medication Sig Start Date End Date Taking? Authorizing Provider  aspirin 81 MG chewable tablet Chew 81 mg by mouth daily.    Historical Provider, MD  atorvastatin (LIPITOR) 20 MG tablet Take 20 mg by mouth daily.  02/16/13   Historical Provider, MD  baclofen (LIORESAL) 20 MG tablet Take 1 tablet (20 mg total) by mouth 3 (three) times daily. 03/21/13   Arthor Captain, PA-C  ibuprofen (ADVIL,MOTRIN) 800 MG tablet Take 1 tablet (800 mg total) by mouth 3 (three) times daily. 04/19/15   Barrett Henle, PA-C   levothyroxine (SYNTHROID, LEVOTHROID) 175 MCG tablet Take 175 mcg by mouth daily before breakfast.    Historical Provider, MD  losartan (COZAAR) 50 MG tablet Take 50 mg by mouth daily.    Historical Provider, MD  metaxalone (SKELAXIN) 800 MG tablet Take 800 mg by mouth 3 (three) times daily as needed for pain.     Historical Provider, MD  Multiple Vitamin (MULTIVITAMIN) tablet Take 1 tablet by mouth daily.    Historical Provider, MD  omeprazole (PRILOSEC) 20 MG capsule TAKE 1 CAPSULE BY MOUTH DAILY.    [name], MD   BP 154/74 mmHg  Pulse 61  Temp(Src) 97.5 F (36.4 C) (Oral)  Resp 16  SpO2 100% Physical Exam  Constitutional: She is oriented to person, place, and time. She appears well-developed and well-nourished. No distress.  HENT:  Head: Normocephalic and atraumatic.  Eyes: Conjunctivae and EOM are normal.  Neck: Neck supple. No tracheal deviation present.  Cardiovascular: Normal rate.   Pulmonary/Chest: Effort normal. No respiratory distress.  Musculoskeletal: Normal range of motion.       Right hand: She exhibits normal range of motion, no tenderness, no bony tenderness, normal capillary refill, no deformity, no laceration and no swelling. Normal sensation noted. Normal strength noted.  Full ROM of right fourth and fifth digits Sensation intact 5/5 strength  Equal grip strength  2+ radial pulses No swelling, ecchymosis, abrasion, or lacerations,   Neurological: She is alert and oriented to person, place, and time.  Skin: Skin is warm and dry.  Psychiatric: She has a normal mood and affect. Her behavior is normal.  Nursing note and vitals reviewed.   ED Course  Procedures (including critical care time) DIAGNOSTIC STUDIES: Oxygen Saturation is 100% on RA, normal by my interpretation.    COORDINATION OF CARE: 11:29 AM- Pt advised of plan for treatment and pt agrees. Recommended to continue taking Ibuprofen and to use ice on finger while elevated. Will receive 800 mg  Ibuprofen. Pt advised to follow up with PCP in one week.   Imaging Review Dg Hand Complete Right  04/19/2015  CLINICAL DATA:  Right hand injury 2 days ago, fourth metacarpal pain EXAM: RIGHT HAND - COMPLETE 3+ VIEW COMPARISON:  11/14/2009 FINDINGS: Three views of the right hand submitted. No acute fracture or subluxation. No radiopaque foreign body. IMPRESSION: Negative. Electronically Signed   By: Natasha Mead M.D.   On: 04/19/2015 11:05   Melburn Hake, PA-C has personally reviewed and evaluated these images as part of her medical decision-making.  Filed Vitals:   04/19/15 1143  BP: 141/79  Pulse: 77  Temp: 98.1 F (36.7 C)  Resp: 16     MDM   Final diagnoses:  Pain of right hand    Pt presents with right 4th and 5th finger pain/swelling that occurred after hitting her hand on a pitcher. Denies numbness or weakness.  VSS. Exam was unremarkable, right hand/digits neurovascularly intact. Right hand xray was negative. No evidence of septic joint. Plan to d/c pt home with NSAIDs and advised pt to use ice for pain relief and follow up with PCP.  Evaluation does not show pathology requring ongoing emergent intervention or admission. Pt is hemodynamically stable and mentating appropriately. Discussed findings/results and plan with patient/guardian, who agrees with plan. All questions answered. Return precautions discussed and outpatient follow up given.    I personally performed the services described in this documentation, which was scribed in my presence. The recorded information has been reviewed and is accurate.   Satira Sark Rhame, [place] 04/19/15 1948  Glynn Octave, MD 04/20/15 1500

## 2015-04-19 NOTE — Discharge Instructions (Signed)
Take your medications as prescribed. You may also apply ice to your hand for pain relief. Follow-up with your primary care provider in one week. Turn to the emergency department if symptoms worsen or new onset of fever, numbness, tingling, weakness, swelling.

## 2015-04-19 NOTE — ED Notes (Signed)
Declined W/C at D/C and was escorted to lobby by RN. 

## 2015-04-19 NOTE — ED Notes (Signed)
PT reports hitting her Rt small finger. Pt has swelling to RT small finger and RT ring finger.

## 2015-05-01 DIAGNOSIS — Z0000 Encounter for general adult medical examination without abnormal findings: Secondary | ICD-10-CM | POA: Diagnosis not present

## 2015-05-01 DIAGNOSIS — E039 Hypothyroidism, unspecified: Secondary | ICD-10-CM | POA: Diagnosis not present

## 2015-05-01 DIAGNOSIS — E7800 Pure hypercholesterolemia, unspecified: Secondary | ICD-10-CM | POA: Diagnosis not present

## 2015-05-01 DIAGNOSIS — I10 Essential (primary) hypertension: Secondary | ICD-10-CM | POA: Diagnosis not present

## 2015-05-08 DIAGNOSIS — K219 Gastro-esophageal reflux disease without esophagitis: Secondary | ICD-10-CM | POA: Diagnosis not present

## 2015-05-08 DIAGNOSIS — I10 Essential (primary) hypertension: Secondary | ICD-10-CM | POA: Diagnosis not present

## 2015-05-08 DIAGNOSIS — E7800 Pure hypercholesterolemia, unspecified: Secondary | ICD-10-CM | POA: Diagnosis not present

## 2015-05-08 DIAGNOSIS — E039 Hypothyroidism, unspecified: Secondary | ICD-10-CM | POA: Diagnosis not present

## 2015-06-14 DIAGNOSIS — J019 Acute sinusitis, unspecified: Secondary | ICD-10-CM | POA: Diagnosis not present

## 2015-07-03 MED FILL — AZITHROMYCIN 250 MG TABLET: 250 | 5 days supply | Qty: 6 | Fill #1

## 2015-07-06 DIAGNOSIS — E559 Vitamin D deficiency, unspecified: Secondary | ICD-10-CM | POA: Diagnosis not present

## 2015-07-06 DIAGNOSIS — E039 Hypothyroidism, unspecified: Secondary | ICD-10-CM | POA: Diagnosis not present

## 2015-07-06 MED FILL — SYNTHROID 175 MCG TABLET: 175 | 30 days supply | Qty: 30 | Fill #1

## 2015-07-09 MED FILL — PANTOPRAZOLE SOD DR 40 MG T: 40 | 90 days supply | Qty: 90 | Fill #1

## 2015-07-09 MED FILL — ATORVASTATIN 20 MG TABLET: 20 | 90 days supply | Qty: 45 | Fill #1

## 2015-07-23 MED FILL — VIT D2 1.25 MG (50,000 UNIT: 1.25 MG | 84 days supply | Qty: 12 | Fill #0

## 2015-08-09 DIAGNOSIS — E039 Hypothyroidism, unspecified: Secondary | ICD-10-CM | POA: Diagnosis not present

## 2015-08-09 DIAGNOSIS — E559 Vitamin D deficiency, unspecified: Secondary | ICD-10-CM | POA: Diagnosis not present

## 2015-08-10 MED FILL — tiZANidine HCL 4 MG TABS: 4 | 30 days supply | Qty: 60 | Fill #0

## 2015-08-23 DIAGNOSIS — R1011 Right upper quadrant pain: Secondary | ICD-10-CM | POA: Diagnosis not present

## 2015-08-23 DIAGNOSIS — R110 Nausea: Secondary | ICD-10-CM | POA: Diagnosis not present

## 2015-08-23 MED FILL — ANASPAZ 0.125 MG TABLET ODT: 0.125 | 7 days supply | Qty: 40 | Fill #0

## 2015-09-03 ENCOUNTER — Other Ambulatory Visit: Payer: Self-pay | Admitting: Gastroenterology

## 2015-09-03 DIAGNOSIS — R194 Change in bowel habit: Secondary | ICD-10-CM | POA: Diagnosis not present

## 2015-09-03 DIAGNOSIS — R1084 Generalized abdominal pain: Secondary | ICD-10-CM

## 2015-09-03 DIAGNOSIS — R109 Unspecified abdominal pain: Secondary | ICD-10-CM | POA: Diagnosis not present

## 2015-09-06 MED FILL — LOSARTAN POTASSIUM 50 MG TA: 50 | 90 days supply | Qty: 90 | Fill #3

## 2015-09-10 ENCOUNTER — Ambulatory Visit
Admission: RE | Admit: 2015-09-10 | Discharge: 2015-09-10 | Disposition: A | Discharge status: Home / Self Care | Payer: Medicare Other | Source: Ambulatory Visit | Attending: Gastroenterology | Admitting: Gastroenterology

## 2015-09-10 DIAGNOSIS — R1084 Generalized abdominal pain: Secondary | ICD-10-CM

## 2015-09-10 DIAGNOSIS — R197 Diarrhea, unspecified: Secondary | ICD-10-CM | POA: Diagnosis not present

## 2015-09-10 DIAGNOSIS — R1011 Right upper quadrant pain: Secondary | ICD-10-CM | POA: Diagnosis not present

## 2015-09-10 MED ORDER — IOPAMIDOL (ISOVUE-300) INJECTION 61%
125.0000 mL | Freq: Once | INTRAVENOUS | Status: AC | PRN
  Administered 2015-09-10: 125 mL via INTRAVENOUS

## 2015-09-14 ENCOUNTER — Other Ambulatory Visit: Payer: Self-pay | Admitting: General Surgery

## 2015-09-14 DIAGNOSIS — K432 Incisional hernia without obstruction or gangrene: Secondary | ICD-10-CM | POA: Diagnosis not present

## 2015-10-03 DIAGNOSIS — J329 Chronic sinusitis, unspecified: Secondary | ICD-10-CM | POA: Diagnosis not present

## 2015-10-03 MED FILL — AMOXICILLIN 500 MG CAPSULE: 500 | 10 days supply | Qty: 20 | Fill #0

## 2015-10-08 NOTE — Pre-Procedure Instructions (Addendum)
    Lianne CureHattie D Glowacki  10/08/2015      [facility] [place] - [address] [place] [address] Phone: [phone] Fax: [phone]    Your procedure is scheduled on 10-17-2015   Wednesday .  [facility] Ad[facility] at 6:30 A.M.   Call this number if you have problems the morning of surgery:  [phone]     Remember:  Do not eat food or drink liquids after midnight.   Take these medicines the morning of surgery with A SIP OF WATER Atorvastatin(Lipitor),Levothyroxine(synthroid),Skelaxin if needed,protonix,zanaflex if needed             Stop aspirin,aleve,advil,ibuprofen,Goody's Powders,vitamins and any over the counter supplements 5 days prio to surgery   Do not wear jewelry, make-up or nail polish.  Do not wear lotions, powders, or perfumes.  You may not wear deodorant.  Do not shave 48 hours prior to surgery.  Men may shave face and neck .  Do not bring valuables to the hospital.  [facility] is not responsible for any belongings or valuables.  Contacts, dentures or bridgework may not be worn into surgery.  Leave your suitcase in the car.  After surgery it may be brought to your room.  For patients admitted to the hospital, discharge time will be determined by your treatment team.  Patients discharged the day of surgery will not be allowed to drive home.    Special instructions:  See attached Sheet for instructions on CHG showers  Please read over the following fact sheets that you were given. Pain Booklet, Coughing and Deep Breathing and Surgical Site Infection Prevention

## 2015-10-09 ENCOUNTER — Encounter (HOSPITAL_COMMUNITY): Payer: Self-pay

## 2015-10-09 ENCOUNTER — Encounter (HOSPITAL_COMMUNITY)
Admission: RE | Admit: 2015-10-09 | Discharge: 2015-10-09 | Disposition: A | Discharge status: Home / Self Care | Payer: Medicare Other | Source: Ambulatory Visit | Attending: General Surgery | Admitting: General Surgery

## 2015-10-09 DIAGNOSIS — Z01812 Encounter for preprocedural laboratory examination: Secondary | ICD-10-CM | POA: Diagnosis not present

## 2015-10-09 DIAGNOSIS — K432 Incisional hernia without obstruction or gangrene: Secondary | ICD-10-CM | POA: Diagnosis not present

## 2015-10-09 LAB — CBC WITH DIFFERENTIAL/PLATELET
BASOS ABS: 0 10*3/uL (ref 0.0–0.1)
BASOS PCT: 0 %
Eosinophils Absolute: 0.4 10*3/uL (ref 0.0–0.7)
Eosinophils Relative: 5 %
HEMATOCRIT: 39 % (ref 36.0–46.0)
HEMOGLOBIN: 12.3 g/dL (ref 12.0–15.0)
Lymphocytes Relative: 35 %
Lymphs Abs: 2.7 10*3/uL (ref 0.7–4.0)
MCH: 29.6 pg (ref 26.0–34.0)
MCHC: 31.5 g/dL (ref 30.0–36.0)
MCV: 93.8 fL (ref 78.0–100.0)
Monocytes Absolute: 0.5 10*3/uL (ref 0.1–1.0)
Monocytes Relative: 6 %
NEUTROS ABS: 4.1 10*3/uL (ref 1.7–7.7)
NEUTROS PCT: 54 %
Platelets: 244 10*3/uL (ref 150–400)
RBC: 4.16 MIL/uL (ref 3.87–5.11)
RDW: 13.4 % (ref 11.5–15.5)
WBC: 7.7 10*3/uL (ref 4.0–10.5)

## 2015-10-09 LAB — BASIC METABOLIC PANEL
ANION GAP: 11 (ref 5–15)
BUN: 8 mg/dL (ref 6–20)
CALCIUM: 9 mg/dL (ref 8.9–10.3)
CO2: 23 mmol/L (ref 22–32)
Chloride: 105 mmol/L (ref 101–111)
Creatinine, Ser: 0.65 mg/dL (ref 0.44–1.00)
Glucose, Bld: 103 mg/dL — ABNORMAL HIGH (ref 65–99)
Potassium: 4.1 mmol/L (ref 3.5–5.1)
SODIUM: 139 mmol/L (ref 135–145)

## 2015-10-17 ENCOUNTER — Ambulatory Visit (HOSPITAL_COMMUNITY): Payer: Medicare Other | Admitting: Certified Registered Nurse Anesthetist

## 2015-10-17 ENCOUNTER — Encounter (HOSPITAL_COMMUNITY)
Admission: RE | Disposition: A | Discharge status: Home / Self Care | Payer: Self-pay | Source: Ambulatory Visit | Attending: General Surgery

## 2015-10-17 ENCOUNTER — Observation Stay (HOSPITAL_COMMUNITY)
Admission: RE | Admit: 2015-10-17 | Discharge: 2015-10-19 | Disposition: A | Discharge status: Home / Self Care | Payer: Medicare Other | Source: Ambulatory Visit | Attending: General Surgery | Admitting: General Surgery

## 2015-10-17 ENCOUNTER — Encounter (HOSPITAL_COMMUNITY): Payer: Self-pay | Admitting: Certified Registered Nurse Anesthetist

## 2015-10-17 DIAGNOSIS — Z87891 Personal history of nicotine dependence: Secondary | ICD-10-CM | POA: Diagnosis not present

## 2015-10-17 DIAGNOSIS — E7800 Pure hypercholesterolemia, unspecified: Secondary | ICD-10-CM | POA: Insufficient documentation

## 2015-10-17 DIAGNOSIS — K589 Irritable bowel syndrome without diarrhea: Secondary | ICD-10-CM | POA: Diagnosis not present

## 2015-10-17 DIAGNOSIS — M797 Fibromyalgia: Secondary | ICD-10-CM | POA: Diagnosis not present

## 2015-10-17 DIAGNOSIS — F419 Anxiety disorder, unspecified: Secondary | ICD-10-CM | POA: Insufficient documentation

## 2015-10-17 DIAGNOSIS — I10 Essential (primary) hypertension: Secondary | ICD-10-CM | POA: Insufficient documentation

## 2015-10-17 DIAGNOSIS — K219 Gastro-esophageal reflux disease without esophagitis: Secondary | ICD-10-CM | POA: Insufficient documentation

## 2015-10-17 DIAGNOSIS — E039 Hypothyroidism, unspecified: Secondary | ICD-10-CM | POA: Insufficient documentation

## 2015-10-17 DIAGNOSIS — K432 Incisional hernia without obstruction or gangrene: Secondary | ICD-10-CM | POA: Diagnosis not present

## 2015-10-17 DIAGNOSIS — K430 Incisional hernia with obstruction, without gangrene: Secondary | ICD-10-CM | POA: Diagnosis not present

## 2015-10-17 DIAGNOSIS — K436 Other and unspecified ventral hernia with obstruction, without gangrene: Secondary | ICD-10-CM | POA: Diagnosis present

## 2015-10-17 DIAGNOSIS — Z6841 Body Mass Index (BMI) 40.0 and over, adult: Secondary | ICD-10-CM | POA: Insufficient documentation

## 2015-10-17 HISTORY — PX: INSERTION OF MESH: SHX5868

## 2015-10-17 HISTORY — PX: INCISIONAL HERNIA REPAIR: SHX193

## 2015-10-17 SURGERY — REPAIR, HERNIA, INCISIONAL, LAPAROSCOPIC
Anesthesia: General | Site: Abdomen

## 2015-10-17 MED ORDER — DIPHENHYDRAMINE HCL 25 MG PO CAPS: 25.0000 mg | ORAL_CAPSULE | Freq: Four times a day (QID) | ORAL | Status: DC | PRN

## 2015-10-17 MED ORDER — METOPROLOL TARTRATE 5 MG/5ML IV SOLN
INTRAVENOUS | Status: DC | PRN
  Administered 2015-10-17 (×2): 2 mg via INTRAVENOUS

## 2015-10-17 MED ORDER — CEFAZOLIN SODIUM-DEXTROSE 2-4 GM/100ML-% IV SOLN
INTRAVENOUS | Status: AC
Start: 2015-10-17 — End: 2015-10-17
  Filled 2015-10-17: qty 100

## 2015-10-17 MED ORDER — MORPHINE SULFATE (PF) 2 MG/ML IV SOLN
2.0000 mg | INTRAVENOUS | Status: DC | PRN
  Administered 2015-10-17: 2 mg via INTRAVENOUS
  Filled 2015-10-17: qty 1

## 2015-10-17 MED ORDER — ARTIFICIAL TEARS OP OINT
TOPICAL_OINTMENT | OPHTHALMIC | Status: DC | PRN
  Administered 2015-10-17: 1 via OPHTHALMIC

## 2015-10-17 MED ORDER — OXYCODONE HCL 5 MG PO TABS
5.0000 mg | ORAL_TABLET | ORAL | Status: DC | PRN
  Administered 2015-10-17: 10 mg via ORAL
  Administered 2015-10-18: 5 mg via ORAL
  Administered 2015-10-18 – 2015-10-19 (×4): 10 mg via ORAL
  Filled 2015-10-17 (×5): qty 2
  Filled 2015-10-17: qty 1

## 2015-10-17 MED ORDER — HYDROMORPHONE HCL 1 MG/ML IJ SOLN
INTRAMUSCULAR | Status: AC
  Administered 2015-10-17: 0.5 mg via INTRAVENOUS
  Filled 2015-10-17: qty 1

## 2015-10-17 MED ORDER — ATORVASTATIN CALCIUM 20 MG PO TABS
20.0000 mg | ORAL_TABLET | Freq: Every day | ORAL | Status: DC
  Administered 2015-10-18 – 2015-10-19 (×2): 20 mg via ORAL
  Filled 2015-10-17 (×2): qty 1

## 2015-10-17 MED ORDER — ARTIFICIAL TEARS OP OINT
TOPICAL_OINTMENT | OPHTHALMIC | Status: AC
  Filled 2015-10-17: qty 3.5

## 2015-10-17 MED ORDER — DIPHENHYDRAMINE HCL 50 MG/ML IJ SOLN: 25.0000 mg | Freq: Four times a day (QID) | INTRAMUSCULAR | Status: DC | PRN

## 2015-10-17 MED ORDER — GLYCOPYRROLATE 0.2 MG/ML IV SOSY
PREFILLED_SYRINGE | INTRAVENOUS | Status: AC
  Filled 2015-10-17: qty 3

## 2015-10-17 MED ORDER — ROCURONIUM BROMIDE 50 MG/5ML IV SOLN
INTRAVENOUS | Status: AC
  Filled 2015-10-17: qty 1

## 2015-10-17 MED ORDER — LIDOCAINE HCL (CARDIAC) 20 MG/ML IV SOLN
INTRAVENOUS | Status: DC | PRN
  Administered 2015-10-17: 60 mg via INTRATRACHEAL

## 2015-10-17 MED ORDER — 0.9 % SODIUM CHLORIDE (POUR BTL) OPTIME
TOPICAL | Status: DC | PRN
  Administered 2015-10-17: 1000 mL

## 2015-10-17 MED ORDER — ASPIRIN 81 MG PO CHEW
81.0000 mg | CHEWABLE_TABLET | Freq: Every day | ORAL | Status: DC
  Administered 2015-10-18 – 2015-10-19 (×2): 81 mg via ORAL
  Filled 2015-10-17 (×2): qty 1

## 2015-10-17 MED ORDER — LEVOTHYROXINE SODIUM 75 MCG PO TABS
150.0000 ug | ORAL_TABLET | Freq: Every day | ORAL | Status: DC
  Administered 2015-10-18 – 2015-10-19 (×2): 150 ug via ORAL
  Filled 2015-10-17 (×2): qty 2

## 2015-10-17 MED ORDER — FENTANYL CITRATE (PF) 250 MCG/5ML IJ SOLN
INTRAMUSCULAR | Status: AC
  Filled 2015-10-17: qty 5

## 2015-10-17 MED ORDER — FENTANYL CITRATE (PF) 250 MCG/5ML IJ SOLN
INTRAMUSCULAR | Status: DC | PRN
  Administered 2015-10-17 (×2): 25 ug via INTRAVENOUS
  Administered 2015-10-17 (×2): 100 ug via INTRAVENOUS

## 2015-10-17 MED ORDER — PROPOFOL 10 MG/ML IV BOLUS
INTRAVENOUS | Status: DC | PRN
  Administered 2015-10-17: 200 mg via INTRAVENOUS

## 2015-10-17 MED ORDER — NEOSTIGMINE METHYLSULFATE 10 MG/10ML IV SOLN
INTRAVENOUS | Status: DC | PRN
  Administered 2015-10-17: 5 mg via INTRAVENOUS

## 2015-10-17 MED ORDER — HEPARIN SODIUM (PORCINE) 5000 UNIT/ML IJ SOLN: 5000.0000 [IU] | Freq: Once | INTRAMUSCULAR | Status: DC

## 2015-10-17 MED ORDER — CEFAZOLIN SODIUM-DEXTROSE 2-4 GM/100ML-% IV SOLN
2.0000 g | INTRAVENOUS | Status: AC
  Administered 2015-10-17: 2 g via INTRAVENOUS

## 2015-10-17 MED ORDER — SUCCINYLCHOLINE CHLORIDE 20 MG/ML IJ SOLN
INTRAMUSCULAR | Status: DC | PRN
  Administered 2015-10-17: 100 mg via INTRAVENOUS

## 2015-10-17 MED ORDER — BUPIVACAINE-EPINEPHRINE (PF) 0.25% -1:200000 IJ SOLN
INTRAMUSCULAR | Status: AC
  Filled 2015-10-17: qty 30

## 2015-10-17 MED ORDER — PANTOPRAZOLE SODIUM 40 MG PO TBEC
40.0000 mg | DELAYED_RELEASE_TABLET | Freq: Every day | ORAL | Status: DC
  Administered 2015-10-17 – 2015-10-19 (×3): 40 mg via ORAL
  Filled 2015-10-17 (×3): qty 1

## 2015-10-17 MED ORDER — ONDANSETRON 4 MG PO TBDP: 4.0000 mg | ORAL_TABLET | Freq: Four times a day (QID) | ORAL | Status: DC | PRN

## 2015-10-17 MED ORDER — DOCUSATE SODIUM 100 MG PO CAPS
100.0000 mg | ORAL_CAPSULE | Freq: Two times a day (BID) | ORAL | Status: DC
  Administered 2015-10-17 – 2015-10-19 (×4): 100 mg via ORAL
  Filled 2015-10-17 (×4): qty 1

## 2015-10-17 MED ORDER — LIDOCAINE 2% (20 MG/ML) 5 ML SYRINGE
INTRAMUSCULAR | Status: AC
  Filled 2015-10-17: qty 5

## 2015-10-17 MED ORDER — GLYCOPYRROLATE 0.2 MG/ML IJ SOLN
INTRAMUSCULAR | Status: DC | PRN
  Administered 2015-10-17: 0.2 mg via INTRAVENOUS
  Administered 2015-10-17: .8 mg via INTRAVENOUS

## 2015-10-17 MED ORDER — MIDAZOLAM HCL 2 MG/2ML IJ SOLN
INTRAMUSCULAR | Status: AC
  Filled 2015-10-17: qty 2

## 2015-10-17 MED ORDER — NEOSTIGMINE METHYLSULFATE 5 MG/5ML IV SOSY
PREFILLED_SYRINGE | INTRAVENOUS | Status: AC
  Filled 2015-10-17: qty 5

## 2015-10-17 MED ORDER — BUPIVACAINE-EPINEPHRINE 0.25% -1:200000 IJ SOLN
INTRAMUSCULAR | Status: DC | PRN
  Administered 2015-10-17: 9 mL

## 2015-10-17 MED ORDER — SODIUM CHLORIDE 0.9 % IV SOLN
INTRAVENOUS | Status: DC
  Administered 2015-10-17 – 2015-10-18 (×2): via INTRAVENOUS

## 2015-10-17 MED ORDER — SUCCINYLCHOLINE CHLORIDE 200 MG/10ML IV SOSY
PREFILLED_SYRINGE | INTRAVENOUS | Status: AC
  Filled 2015-10-17: qty 10

## 2015-10-17 MED ORDER — ENOXAPARIN SODIUM 40 MG/0.4ML ~~LOC~~ SOLN
40.0000 mg | SUBCUTANEOUS | Status: DC
  Administered 2015-10-18 – 2015-10-19 (×2): 40 mg via SUBCUTANEOUS
  Filled 2015-10-17 (×2): qty 0.4

## 2015-10-17 MED ORDER — SIMETHICONE 80 MG PO CHEW: 40.0000 mg | CHEWABLE_TABLET | Freq: Four times a day (QID) | ORAL | Status: DC | PRN

## 2015-10-17 MED ORDER — TIZANIDINE HCL 2 MG PO TABS
4.0000 mg | ORAL_TABLET | Freq: Four times a day (QID) | ORAL | Status: DC | PRN
Start: 2015-10-17 — End: 2015-10-19

## 2015-10-17 MED ORDER — MIDAZOLAM HCL 2 MG/2ML IJ SOLN
INTRAMUSCULAR | Status: DC | PRN
  Administered 2015-10-17 (×2): 1 mg via INTRAVENOUS

## 2015-10-17 MED ORDER — MEPERIDINE HCL 25 MG/ML IJ SOLN: 6.2500 mg | INTRAMUSCULAR | Status: DC | PRN

## 2015-10-17 MED ORDER — METOPROLOL TARTRATE 5 MG/5ML IV SOLN
INTRAVENOUS | Status: AC
  Filled 2015-10-17: qty 5

## 2015-10-17 MED ORDER — ACETAMINOPHEN 500 MG PO TABS
1000.0000 mg | ORAL_TABLET | Freq: Four times a day (QID) | ORAL | Status: DC
  Administered 2015-10-17 – 2015-10-19 (×6): 1000 mg via ORAL
  Filled 2015-10-17 (×7): qty 2

## 2015-10-17 MED ORDER — ONDANSETRON HCL 4 MG/2ML IJ SOLN
INTRAMUSCULAR | Status: DC | PRN
  Administered 2015-10-17: 4 mg via INTRAVENOUS

## 2015-10-17 MED ORDER — LACTATED RINGERS IV SOLN
INTRAVENOUS | Status: DC | PRN
  Administered 2015-10-17 (×2): via INTRAVENOUS

## 2015-10-17 MED ORDER — PROPOFOL 10 MG/ML IV BOLUS
INTRAVENOUS | Status: AC
  Filled 2015-10-17: qty 20

## 2015-10-17 MED ORDER — LOSARTAN POTASSIUM 50 MG PO TABS
50.0000 mg | ORAL_TABLET | Freq: Every day | ORAL | Status: DC
  Administered 2015-10-18 – 2015-10-19 (×2): 50 mg via ORAL
  Filled 2015-10-17 (×2): qty 1

## 2015-10-17 MED ORDER — ROCURONIUM BROMIDE 100 MG/10ML IV SOLN
INTRAVENOUS | Status: DC | PRN
  Administered 2015-10-17: 50 mg via INTRAVENOUS

## 2015-10-17 MED ORDER — HYDROMORPHONE HCL 1 MG/ML IJ SOLN
0.2500 mg | INTRAMUSCULAR | Status: DC | PRN
  Administered 2015-10-17: 0.5 mg via INTRAVENOUS
  Administered 2015-10-17 (×2): 0.25 mg via INTRAVENOUS

## 2015-10-17 MED ORDER — ONDANSETRON HCL 4 MG/2ML IJ SOLN
4.0000 mg | Freq: Four times a day (QID) | INTRAMUSCULAR | Status: DC | PRN
  Administered 2015-10-17: 4 mg via INTRAVENOUS
  Filled 2015-10-17: qty 2

## 2015-10-17 MED ORDER — ONDANSETRON HCL 4 MG/2ML IJ SOLN
INTRAMUSCULAR | Status: AC
  Filled 2015-10-17: qty 2

## 2015-10-17 SURGICAL SUPPLY — 49 items
APPLIER CLIP 5 13 M/L LIGAMAX5 (MISCELLANEOUS) ×3
APPLIER CLIP LOGIC TI 5 (MISCELLANEOUS) IMPLANT
APPLIER CLIP ROT 10 11.4 M/L (STAPLE)
BNDG GAUZE ELAST 4 BULKY (GAUZE/BANDAGES/DRESSINGS) IMPLANT
CANISTER SUCTION 2500CC (MISCELLANEOUS) IMPLANT
CHLORAPREP W/TINT 26ML (MISCELLANEOUS) ×3 IMPLANT
CLIP APPLIE 5 13 M/L LIGAMAX5 (MISCELLANEOUS) ×1 IMPLANT
CLIP APPLIE ROT 10 11.4 M/L (STAPLE) IMPLANT
CLOSURE STERI-STRIP 1/2X4 (GAUZE/BANDAGES/DRESSINGS) ×1
CLOSURE WOUND 1/2 X4 (GAUZE/BANDAGES/DRESSINGS) ×1
CLSR STERI-STRIP ANTIMIC 1/2X4 (GAUZE/BANDAGES/DRESSINGS) ×2 IMPLANT
COVER SURGICAL LIGHT HANDLE (MISCELLANEOUS) ×3 IMPLANT
DEVICE RELIATACK FIXATION (MISCELLANEOUS) ×3 IMPLANT
DEVICE TROCAR PUNCTURE CLOSURE (ENDOMECHANICALS) ×3 IMPLANT
DRAPE INCISE IOBAN 66X45 STRL (DRAPES) ×3 IMPLANT
DRAPE LAPAROSCOPIC ABDOMINAL (DRAPES) ×3 IMPLANT
ELECT REM PT RETURN 9FT ADLT (ELECTROSURGICAL) ×3
ELECTRODE REM PT RTRN 9FT ADLT (ELECTROSURGICAL) ×1 IMPLANT
GLOVE BIO SURGEON STRL SZ7 (GLOVE) ×3 IMPLANT
GLOVE BIO SURGEON STRL SZ7.5 (GLOVE) ×3 IMPLANT
GLOVE BIOGEL PI IND STRL 7.5 (GLOVE) ×2 IMPLANT
GLOVE BIOGEL PI INDICATOR 7.5 (GLOVE) ×4
GOWN STRL REUS W/ TWL LRG LVL3 (GOWN DISPOSABLE) ×3 IMPLANT
GOWN STRL REUS W/TWL LRG LVL3 (GOWN DISPOSABLE) ×6
KIT BASIN OR (CUSTOM PROCEDURE TRAY) ×3 IMPLANT
KIT ROOM TURNOVER OR (KITS) ×3 IMPLANT
LIQUID BAND (GAUZE/BANDAGES/DRESSINGS) ×3 IMPLANT
MARKER SKIN DUAL TIP RULER LAB (MISCELLANEOUS) ×3 IMPLANT
MESH VENTRALIGHT ST 8IN CRC (Mesh General) ×3 IMPLANT
NEEDLE SPNL 22GX3.5 QUINCKE BK (NEEDLE) ×3 IMPLANT
NS IRRIG 1000ML POUR BTL (IV SOLUTION) ×3 IMPLANT
PAD ARMBOARD 7.5X6 YLW CONV (MISCELLANEOUS) ×6 IMPLANT
RELOAD RELIATACK 10 (MISCELLANEOUS) ×6 IMPLANT
RELOAD RELIATACK 5 (MISCELLANEOUS) ×15 IMPLANT
SCALPEL HARMONIC ACE (MISCELLANEOUS) IMPLANT
SCISSORS LAP 5X35 DISP (ENDOMECHANICALS) ×3 IMPLANT
SET IRRIG TUBING LAPAROSCOPIC (IRRIGATION / IRRIGATOR) IMPLANT
SLEEVE ENDOPATH XCEL 5M (ENDOMECHANICALS) ×9 IMPLANT
STRIP CLOSURE SKIN 1/2X4 (GAUZE/BANDAGES/DRESSINGS) ×2 IMPLANT
SUT MNCRL AB 4-0 PS2 18 (SUTURE) ×3 IMPLANT
SUT PROLENE 0 CT 1 CR/8 (SUTURE) ×3 IMPLANT
TOWEL OR 17X24 6PK STRL BLUE (TOWEL DISPOSABLE) ×3 IMPLANT
TOWEL OR 17X26 10 PK STRL BLUE (TOWEL DISPOSABLE) ×3 IMPLANT
TRAY FOLEY CATH 14FR (SET/KITS/TRAYS/PACK) ×3 IMPLANT
TRAY LAPAROSCOPIC MC (CUSTOM PROCEDURE TRAY) ×3 IMPLANT
TROCAR XCEL BLUNT TIP 100MML (ENDOMECHANICALS) IMPLANT
TROCAR XCEL NON-BLD 11X100MML (ENDOMECHANICALS) ×3 IMPLANT
TROCAR XCEL NON-BLD 5MMX100MML (ENDOMECHANICALS) ×3 IMPLANT
TUBING INSUFFLATION (TUBING) ×3 IMPLANT

## 2015-10-17 NOTE — Anesthesia Postprocedure Evaluation (Signed)
Anesthesia Post Note  Patient: Nina Pitts  Procedure(s) Performed: Procedure(s) (LRB): LAPAROSCOPIC INCISIONAL HERNIA WITH MESH  (N/A) INSERTION OF MESH (N/A)  Patient location during evaluation: PACU Anesthesia Type: General Level of consciousness: sedated and patient cooperative Pain management: pain level controlled Vital Signs Assessment: post-procedure vital signs reviewed and stable Respiratory status: spontaneous breathing Cardiovascular status: stable Anesthetic complications: no    Last Vitals:  Filed Vitals:   10/17/15 1139 10/17/15 1217  BP: 129/87 134/75  Pulse: 59 63  Temp:  36.9 C  Resp: 16 16    Last Pain:  Filed Vitals:   10/17/15 1225  PainSc: 8                  Tifanie Gardiner [employer]

## 2015-10-17 NOTE — Anesthesia Preprocedure Evaluation (Addendum)
Anesthesia Evaluation  Patient identified by MRN, date of birth, ID band Patient awake    Reviewed: Allergy & Precautions, H&P , NPO status , Patient's Chart, lab work & pertinent test results  Airway Mallampati: II  TM Distance: >3 FB Neck ROM: full    Dental  (+) Poor Dentition, Dental Advidsory Given   Pulmonary former smoker,    breath sounds clear to auscultation + decreased breath sounds      Cardiovascular hypertension, + Peripheral Vascular Disease   Rhythm:Regular Rate:Normal  Echo 03/2015 LVSF 50-55%, no WMA   Neuro/Psych PSYCHIATRIC DISORDERS Anxiety H/o PTSD Neuromuscular disease    GI/Hepatic GERD  ,S/p recent cholecystectomy, now with suspected bile leak.   Endo/Other  Hypothyroidism Morbid obesity  Renal/GU      Musculoskeletal  (+) Arthritis , Fibromyalgia -  Abdominal (+) + obese,   Peds  Hematology   Anesthesia Other Findings   Reproductive/Obstetrics                            Anesthesia Physical  Anesthesia Plan  ASA: III  Anesthesia Plan: General   Post-op Pain Management:    Induction: Intravenous  Airway Management Planned: Oral ETT  Additional Equipment:   Intra-op Plan:   Post-operative Plan: Extubation in OR  Informed Consent: I have reviewed the patients History and Physical, chart, labs and discussed the procedure including the risks, benefits and alternatives for the proposed anesthesia with the patient or authorized representative who has indicated his/her understanding and acceptance.   Dental Advisory Given  Plan Discussed with: CRNA, Anesthesiologist and Surgeon  Anesthesia Plan Comments:         Anesthesia Quick Evaluation

## 2015-10-17 NOTE — H&P (View-Only) (Signed)
[age] yof who presents with increasing abdominal bulge. she has recent egd/colonoscopy with Dr Ewing SchleinMagod and after this she began to note this getting larger. she has some left sided abdominal pain. she has some discomfort at hernia. It is always out. she has no changes in bowel habits. she underwent ct scan that shows this hernia as well as a couple other possible hernias  PMH Anxiety Disorder Arthritis Back Pain Gastroesophageal Reflux Disease High blood pressure Hypercholesterolemia Oophorectomy Bilateral. Thyroid Disease  Past Surgical History  Appendectomy Cesarean Section - Multiple Colon Polyp Removal - Colonoscopy Gallbladder Surgery - Laparoscopic Hysterectomy (not due to cancer) - Complete Knee Surgery Right. Oral Surgery Resection of Stomach   Allergies  Percocet *ANALGESICS - OPIOID* Aspirin *ANALGESICS - NonNarcotic* Robaxin *MUSCULOSKELETAL THERAPY AGENTS* Vicodin *ANALGESICS - OPIOID*  Medication History  Atorvastatin Calcium (20MG  Tablet, Oral) Active. Ibuprofen (800MG  Tablet, Oral) Active. Synthroid (175MCG Tablet, Oral) Active. Losartan Potassium (50MG  Tablet, Oral) Active. Pantoprazole Sodium (40MG  Tablet DR, Oral) Active. TiZANidine HCl (4MG  Tablet, Oral) Active. Multivitamin Adult (Oral) Active. Medications Reconciled  Social History  Alcohol use Remotely quit alcohol use. Caffeine use Coffee. Illicit drug use Remotely quit drug use. Tobacco use Former smoker.  Family History  Alcohol Abuse Brother, Daughter, [place], Father, Sister. Cancer Father. Diabetes Mellitus Mother, Sister. Heart Disease Mother. Heart disease in female family member before age 54 Hypertension Daughter, Father, Sister. Ischemic Bowel Disease Sister. Respiratory Condition Daughter, Father, Mother, Sister. Thyroid problems Mother.   Review of Systems General Present- Fatigue and Night Sweats. Not Present-  Appetite Loss, Chills, Fever, Weight Gain and Weight Loss. Skin Present- Change in Wart/Mole. Not Present- Dryness, Hives, Jaundice, New Lesions, Non-Healing Wounds, Rash and Ulcer. HEENT Present- Hoarseness, Seasonal Allergies, Sore Throat and Wears glasses/contact lenses. Not Present- Earache, Hearing Loss, Nose Bleed, Oral Ulcers, Ringing in the Ears, Sinus Pain, Visual Disturbances and Yellow Eyes. Respiratory Present- Snoring. Not Present- Bloody sputum, Chronic Cough, Difficulty Breathing and Wheezing. Breast Not Present- Breast Mass, Breast Pain, Nipple Discharge and Skin Changes. Cardiovascular Present- Leg Cramps and Swelling of Extremities. Not Present- Chest Pain, Difficulty Breathing Lying Down, Palpitations, Rapid Heart Rate and Shortness of Breath. Gastrointestinal Present- Abdominal Pain, Bloating, Constipation, Difficulty Swallowing, Excessive gas, Indigestion and Nausea. Not Present- Bloody Stool, Change in Bowel Habits, Chronic diarrhea, Gets full quickly at meals, Hemorrhoids, Rectal Pain and Vomiting. Female Genitourinary Not Present- Frequency, Nocturia, Painful Urination, Pelvic Pain and Urgency. Musculoskeletal Present- Back Pain, Joint Pain, Joint Stiffness, Muscle Pain and Swelling of Extremities. Not Present- Muscle Weakness. Neurological Present- Trouble walking. Not Present- Decreased Memory, Fainting, Headaches, Numbness, Seizures, Tingling, Tremor and Weakness. Psychiatric Present- Anxiety and Change in Sleep Pattern. Not Present- Bipolar, Depression, Fearful and Frequent crying. Endocrine Present- Cold Intolerance and Hair Changes. Not Present- Excessive Hunger, Heat Intolerance, Hot flashes and New Diabetes. Hematology Present- Gland problems. Not Present- Easy Bruising, Excessive bleeding, HIV and Persistent Infections.  Vitals  Weight: 246 lb Height: 64in Body Surface Area: 2.14 m Body Mass Index: 42.23 kg/m  Pulse: 64 (Regular)  BP: 118/76 (Sitting,  Left Arm, Standard) Physical Exam  General Mental Status-Alert. Orientation-Oriented X3. Chest and Lung Exam Chest and lung exam reveals -on auscultation, normal breath sounds, no adventitious sounds and normal vocal resonance. Cardiovascular Cardiovascular examination reveals -normal heart sounds, regular rate and rhythm with no murmurs. Abdomen Note: healed lap scars, difficult to tell size but there is incisional hernia above umbilicus that is mildly tender, I do not palpate any  other hernias  Assessment & Plan [name] MD; 09/14/2015 10:29 AM) Sherald Hess HERNIA (K43.2) Story: Laparoscopic incisional hernia repair with mesh we discussed surgery which I recommended given symptoms and risks without surgery. she is agreeable. will plan lap repair with mesh. discussed recovery and risks with her.

## 2015-10-17 NOTE — Consult Note (Signed)
89 yof who presents with increasing abdominal bulge. she has recent egd/colonoscopy with Dr Ewing SchleinMagod and after this she began to note this getting larger. she has some left sided abdominal pain. she has some discomfort at hernia. It is always out. she has no changes in bowel habits. she underwent ct scan that shows this hernia as well as a couple other possible hernias  PMH Anxiety Disorder Arthritis Back Pain Gastroesophageal Reflux Disease High blood pressure Hypercholesterolemia Oophorectomy Bilateral. Thyroid Disease  Past Surgical History  Appendectomy Cesarean Section - Multiple Colon Polyp Removal - Colonoscopy Gallbladder Surgery - Laparoscopic Hysterectomy (not due to cancer) - Complete Knee Surgery Right. Oral Surgery Resection of Stomach   Allergies  Percocet *ANALGESICS - OPIOID* Aspirin *ANALGESICS - NonNarcotic* Robaxin *MUSCULOSKELETAL THERAPY AGENTS* Vicodin *ANALGESICS - OPIOID*  Medication History  Atorvastatin Calcium (20MG  Tablet, Oral) Active. Ibuprofen (800MG  Tablet, Oral) Active. Synthroid (175MCG Tablet, Oral) Active. Losartan Potassium (50MG  Tablet, Oral) Active. Pantoprazole Sodium (40MG  Tablet DR, Oral) Active. TiZANidine HCl (4MG  Tablet, Oral) Active. Multivitamin Adult (Oral) Active. Medications Reconciled  Social History  Alcohol use Remotely quit alcohol use. Caffeine use Coffee. Illicit drug use Remotely quit drug use. Tobacco use Former smoker.  Family History  Alcohol Abuse Brother, Daughter, Family [place], Father, Sister. Cancer Father. Diabetes Mellitus Mother, Sister. Heart Disease Mother. Heart disease in female family member before age 54 Hypertension Daughter, Father, Sister. Ischemic Bowel Disease Sister. Respiratory Condition Daughter, Father, Mother, Sister. Thyroid problems Mother.   Review of Systems General Present- Fatigue and Night Sweats. Not Present-  Appetite Loss, Chills, Fever, Weight Gain and Weight Loss. Skin Present- Change in Wart/Mole. Not Present- Dryness, Hives, Jaundice, New Lesions, Non-Healing Wounds, Rash and Ulcer. HEENT Present- Hoarseness, Seasonal Allergies, Sore Throat and Wears glasses/contact lenses. Not Present- Earache, Hearing Loss, Nose Bleed, Oral Ulcers, Ringing in the Ears, Sinus Pain, Visual Disturbances and Yellow Eyes. Respiratory Present- Snoring. Not Present- Bloody sputum, Chronic Cough, Difficulty Breathing and Wheezing. Breast Not Present- Breast Mass, Breast Pain, Nipple Discharge and Skin Changes. Cardiovascular Present- Leg Cramps and Swelling of Extremities. Not Present- Chest Pain, Difficulty Breathing Lying Down, Palpitations, Rapid Heart Rate and Shortness of Breath. Gastrointestinal Present- Abdominal Pain, Bloating, Constipation, Difficulty Swallowing, Excessive gas, Indigestion and Nausea. Not Present- Bloody Stool, Change in Bowel Habits, Chronic diarrhea, Gets full quickly at meals, Hemorrhoids, Rectal Pain and Vomiting. Female Genitourinary Not Present- Frequency, Nocturia, Painful Urination, Pelvic Pain and Urgency. Musculoskeletal Present- Back Pain, Joint Pain, Joint Stiffness, Muscle Pain and Swelling of Extremities. Not Present- Muscle Weakness. Neurological Present- Trouble walking. Not Present- Decreased Memory, Fainting, Headaches, Numbness, Seizures, Tingling, Tremor and Weakness. Psychiatric Present- Anxiety and Change in Sleep Pattern. Not Present- Bipolar, Depression, Fearful and Frequent crying. Endocrine Present- Cold Intolerance and Hair Changes. Not Present- Excessive Hunger, Heat Intolerance, Hot flashes and New Diabetes. Hematology Present- Gland problems. Not Present- Easy Bruising, Excessive bleeding, HIV and Persistent Infections.  Vitals  Weight: 246 lb Height: 64in Body Surface Area: 2.14 m Body Mass Index: 42.23 kg/m  Pulse: 64 (Regular)  BP: 118/76 (Sitting,  Left Arm, Standard) Physical Exam  General Mental Status-Alert. Orientation-Oriented X3. Chest and Lung Exam Chest and lung exam reveals -on auscultation, normal breath sounds, no adventitious sounds and normal vocal resonance. Cardiovascular Cardiovascular examination reveals -normal heart sounds, regular rate and rhythm with no murmurs. Abdomen Note: healed lap scars, difficult to tell size but there is incisional hernia above umbilicus that is mildly tender, I do not palpate any  other hernias  Assessment & Plan (Donta Fuster MD; 09/14/2015 10:29 AM) INCISIONAL HERNIA (K43.2) Story: Laparoscopic incisional hernia repair with mesh we discussed surgery which I recommended given symptoms and risks without surgery. she is agreeable. will plan lap repair with mesh. discussed recovery and risks with her.   

## 2015-10-17 NOTE — Transfer of Care (Signed)
Immediate Anesthesia Transfer of Care Note  Patient: Nina Pitts  Procedure(s) Performed: Procedure(s): LAPAROSCOPIC INCISIONAL HERNIA WITH MESH  (N/A) INSERTION OF MESH (N/A)  Patient Location: PACU  Anesthesia Type:General  Level of Consciousness: awake, alert  and patient cooperative  Airway & Oxygen Therapy: Patient Spontanous Breathing and Patient connected to face mask oxygen  Post-op Assessment: Report given to RN and Post -op Vital signs reviewed and stable  Post vital signs: Reviewed and stable  Last Vitals:  Filed Vitals:   10/17/15 0655  BP: 134/77  Pulse: 63  Temp: 36.6 C  Resp: 20    Last Pain: There were no vitals filed for this visit.       Complications: No apparent anesthesia complications

## 2015-10-17 NOTE — Progress Notes (Signed)
Pt admitted to 6N02 via bed from PACU.  Pt AAOX4.  Pt on 3L O2 via [place].  Pt has 20G to lt wrist with fluids infusing.  Pt has multiple abd lap sites.  Pt due to void.  SCDs in place.  Report rcvd from LaieJenna, Charity fundraiserN.  Pt has no questions at the moment.  Will continue to monitor.

## 2015-10-17 NOTE — Discharge Instructions (Signed)
[facility] LAPAROSCOPIC SURGERY: POST OP INSTRUCTIONS  Always review your discharge instruction sheet given to you by the facility where your surgery was performed. IF YOU HAVE DISABILITY OR FAMILY LEAVE FORMS, YOU MUST BRING THEM TO THE OFFICE FOR PROCESSING.   DO NOT GIVE THEM TO YOUR DOCTOR.  1. A prescription for pain medication may be given to you upon discharge.  Take your pain medication as prescribed, if needed.  If narcotic pain medicine is not needed, then you may take acetaminophen (Tylenol), naprosyn (Alleve), or ibuprofen (Advil) as needed. 2. Take your usually prescribed medications unless otherwise directed. 3. If you need a refill on your pain medication, please contact your pharmacy.  They will contact our office to request authorization. Prescriptions will not be filled after 5pm or on week-ends. 4. You should follow a light diet the first few days after arrival home, such as soup and crackers, etc.  Be sure to include lots of fluids daily. 5. Most patients will experience some swelling and bruising in the area of the incisions.  Ice packs will help.  Swelling and bruising can take several days to resolve.  6. It is common to experience some constipation if taking pain medication after surgery.  Increasing fluid intake and taking a stool softener (such as Colace) will usually help or prevent this problem from occurring.  A mild laxative (Milk of Magnesia or Miralax) should be taken according to package instructions if there are no bowel movements after 48 hours. 7. Unless discharge instructions indicate otherwise, you may remove your bandages 48 hours after surgery, and you may shower at that time.  You may have steri-strips (small skin tapes) in place directly over the incision.  These strips should be left on the skin for 7-10 days.  If your surgeon used skin glue on the incision, you may shower in 24 hours.  The glue will flake  off over the next 2-3 weeks.  Any sutures or staples will be removed at the office during your follow-up visit. 8. ACTIVITIES:  You may resume regular (light) daily activities beginning the next day--such as daily self-care, walking, climbing stairs--gradually increasing activities as tolerated.  You may have sexual intercourse when it is comfortable.  Refrain from any heavy lifting or straining until approved by your doctor. a. You may drive when you are no longer taking prescription pain medication, you can comfortably wear a seatbelt, and you can safely maneuver your car and apply brakes. b. RETURN TO WORK:  __________________________________________________________ 9. You should see your doctor in the office for a follow-up appointment approximately 2-3 weeks after your surgery.  Make sure that you call for this appointment within a day or two after you arrive home to insure a convenient appointment time. 10. OTHER INSTRUCTIONS: __________________________________________________________________________________________________________________________ __________________________________________________________________________________________________________________________ WHEN TO CALL YOUR DOCTOR: 1. Fever over 101.0 2. Inability to urinate 3. Continued bleeding from incision. 4. Increased pain, redness, or drainage from the incision. 5. Increasing abdominal pain  The clinic staff is available to answer your questions during regular business hours.  Please don't hesitate to call and ask to speak to one of the nurses for clinical concerns.  If you have a medical emergency, go to the nearest emergency room or call 911.  A surgeon from [facility] is always on call at the hospital. [address], [address], [place], [place]  [address] ? [address] [address], [place], [place]   [address] [phone] ? [phone] ? [phone]   [phone] Web site: [email or web address]  

## 2015-10-17 NOTE — Interval H&P Note (Signed)
History and Physical Interval Note:  10/17/2015 7:59 AM  Nina Pitts  has presented today for surgery, with the diagnosis of Incisional hernia   The various methods of treatment have been discussed with the patient and family. After consideration of risks, benefits and other options for treatment, the patient has consented to  Procedure(s): LAPAROSCOPIC INCISIONAL HERNIA WITH MESH  (N/A) INSERTION OF MESH (N/A) as a surgical intervention .  The patient's history has been reviewed, patient examined, no change in status, stable for surgery.  I have reviewed the patient's chart and labs.  Questions were answered to the patient's satisfaction.     Treston Coker

## 2015-10-17 NOTE — Anesthesia Procedure Notes (Signed)
Procedure Name: Intubation Date/Time: 10/17/2015 8:36 AM Performed by: Salomon MastWALL, Demara Lover COREY Pre-anesthesia Checklist: Patient identified, Emergency Drugs available, Suction available and Patient being monitored Patient Re-evaluated:Patient Re-evaluated prior to inductionOxygen Delivery Method: Circle system utilized Preoxygenation: Pre-oxygenation with 100% oxygen Intubation Type: IV induction Ventilation: Mask ventilation without difficulty Laryngoscope Size: Mac and 3 Grade View: Grade I Tube type: Oral Tube size: 7.0 mm Number of attempts: 1 Airway Equipment and Method: Stylet (Front lower extremely loose and remain intact after intubation. ) Placement Confirmation: ETT inserted through vocal cords under direct vision,  positive ETCO2 and breath sounds checked- equal and bilateral Secured at: 24 cm Tube secured with: Tape Dental Injury: Teeth and Oropharynx as per pre-operative assessment

## 2015-10-17 NOTE — Op Note (Signed)
Preoperative diagnosis: incisional ventral hernia Postoperative diagnosis: saa Procedure:laparoscopic incisional hernia repair with 20 cm ventralight st round mesh, lysis of adhesions for 30 minutes Surgeon: Dr Harden MoMatt Giang Hemme Anesthesia: general EBL: minimal Drains none Specimen none Complications: none Sponge count correct at completion Disposition to recovery stable  Indications: This is a [age] yof with prior history of lap chole who presents with symptomatic umbilical incisional hernia as well as on ct scan appears to have llq hernia.  We discussed laparoscopic repair with mesh.   Procedure: After informed consent was obtained the patient was taken to the operating room. She was given antibiotics. Sequential compression devices were on her legs. She was placed under general anesthesia without complication. Her abdomen was prepped and draped in the standard sterile surgical fashion.  A foley catheter was placed. A surgical timeout was then performed.  I infiltrated marcaine in the left upper quadrant after the stomach was evacuated. I then made an incision and inserted a 5 mm optiview trocar without injury. The abdomen was insufflated to 15 mm Hg pressure. I then inserted two further five mm trocars in the left side of the abdomen. One of these was sized up to a 11 mm port eventually as well. She had adhesions to her abdominal wall that I lysed with combination of blunt dissection and scissors.  Eventually when this was cleared off she had about a 1 cm umbilical hernia and in the llq which is possibly a Spigelian hernia she had another 1 cm defect.  This had a lot of preperitoneal fat incarcerated in it that I reduced. The umbilical hernia had fat incarcerated as well that was reduced. Once I had completed this I decided to use a 20 cm round ventralight st mesh.  This would give good coverage with at least 5 cm overlap in all directions.  I then placed 4 cardinal sutures of 0 prolene in this mesh. I  inserted this into the abdomen and rolled it flat. I then pulled up the sutures to bring this up to abdominal wall. I did insert two further five mm trocars in the left abdomen.  I used the reliatack tacker to secure the mesh to the abdominal wall. I then placed an additional 4 0 prolene sutures in the middle of the others all around the mesh.  This was in good position. Hemostasis was observed.  I then removed the 11 mm trocar and closed this with a 0 vicryl and the endoclose device.  I then desufflated the abdomen and removed all my remaining trocars. I then closed these with 4-0 Monocryl and Dermabond. She tolerated this well was extubated and transferred to the recovery room in stable condition

## 2015-10-18 ENCOUNTER — Encounter (HOSPITAL_COMMUNITY): Payer: Self-pay | Admitting: General Surgery

## 2015-10-18 DIAGNOSIS — I10 Essential (primary) hypertension: Secondary | ICD-10-CM | POA: Diagnosis not present

## 2015-10-18 DIAGNOSIS — E7800 Pure hypercholesterolemia, unspecified: Secondary | ICD-10-CM | POA: Diagnosis not present

## 2015-10-18 DIAGNOSIS — Z87891 Personal history of nicotine dependence: Secondary | ICD-10-CM | POA: Diagnosis not present

## 2015-10-18 DIAGNOSIS — K430 Incisional hernia with obstruction, without gangrene: Secondary | ICD-10-CM | POA: Diagnosis not present

## 2015-10-18 DIAGNOSIS — K219 Gastro-esophageal reflux disease without esophagitis: Secondary | ICD-10-CM | POA: Diagnosis not present

## 2015-10-18 DIAGNOSIS — E039 Hypothyroidism, unspecified: Secondary | ICD-10-CM | POA: Diagnosis not present

## 2015-10-18 LAB — BASIC METABOLIC PANEL
ANION GAP: 9 (ref 5–15)
BUN: 6 mg/dL (ref 6–20)
CALCIUM: 8.4 mg/dL — AB (ref 8.9–10.3)
CO2: 27 mmol/L (ref 22–32)
CREATININE: 0.64 mg/dL (ref 0.44–1.00)
Chloride: 103 mmol/L (ref 101–111)
Glucose, Bld: 115 mg/dL — ABNORMAL HIGH (ref 65–99)
Potassium: 3.6 mmol/L (ref 3.5–5.1)
SODIUM: 139 mmol/L (ref 135–145)

## 2015-10-18 MED ORDER — PROMETHAZINE HCL 12.5 MG PO TABS: 12.5000 mg | ORAL_TABLET | Freq: Four times a day (QID) | ORAL | Status: DC | PRN

## 2015-10-18 MED ORDER — DOCUSATE SODIUM 100 MG PO CAPS: 100.0000 mg | ORAL_CAPSULE | Freq: Two times a day (BID) | ORAL | Status: DC

## 2015-10-18 MED ORDER — OXYCODONE HCL 5 MG PO TABS: 5.0000 mg | ORAL_TABLET | ORAL | Status: DC | PRN

## 2015-10-18 MED FILL — PROMETHAZINE 12.5 MG TABLET: 12.5 | 4 days supply | Qty: 15 | Fill #0

## 2015-10-18 NOTE — Care Management Obs Status (Signed)
MEDICARE OBSERVATION STATUS NOTIFICATION   Patient Details  Name: Lianne CureHattie D Hollinshead MRN: [id number] Date of Birth: [birth year]   Medicare Observation Status Notification Given:  Yes (Medicare OBS procedure )    Kingsley PlanWile, Charvez [place] Marie, RN 10/18/2015, 8:38 AM

## 2015-10-18 NOTE — Progress Notes (Signed)
1 Day Post-Op  Subjective: Pain controlled, some nausea yesterday, tol clears, ambulating, voiding  Objective: Vital signs in last 24 hours: Temp:  [98 F (36.7 C)-98.8 F (37.1 C)] 98.8 F (37.1 C) (05/04 0501) Pulse Rate:  [53-87] 77 (05/04 0501) Resp:  [13-17] 16 (05/04 0501) BP: (117-144)/(70-105) 126/73 mmHg (05/04 0501) SpO2:  [89 %-99 %] 95 % (05/04 0501) Last BM Date: 10/16/15  Intake/Output from previous day: 05/03 0701 - 05/04 0700 In: 3142.5 [P.O.:840; I.V.:2302.5] Out: 1275 [Urine:1250; Blood:25] Intake/Output this shift: Total I/O In: -  Out: 300 [Urine:300]  General appearance: no distress Resp: clear to auscultation bilaterally Cardio: regular rate and rhythm GI: soft approp tender incisions clean some bs  Lab Results:  No results for input(s): WBC, HGB, HCT, PLT in the last 72 hours. BMET  Recent Labs  10/18/15 0627  NA 139  K 3.6  CL 103  CO2 27  GLUCOSE 115*  BUN 6  CREATININE 0.64  CALCIUM 8.4*    Anti-infectives: Anti-infectives    Start     Dose/Rate Route Frequency Ordered Stop   10/17/15 0703  ceFAZolin (ANCEF) 2-4 GM/100ML-% IVPB    Comments:  Marrianne Moodltman, Deborah   : cabinet override      10/17/15 0703 10/17/15 1656   10/17/15 0658  ceFAZolin (ANCEF) IVPB 2g/100 mL premix     2 g 200 mL/hr over 30 Minutes Intravenous On call to O.R. 10/17/15 [id number] 10/17/15 [id number]      Assessment/Plan: POD 1 lap incisional hernia repair with mesh  1. Oral pain meds 2. Regular diet 3. sliv 4. Home if does well 5. lovenox scds     Rexann Lueras 10/18/2015

## 2015-10-18 NOTE — Progress Notes (Signed)
Dr. Dwain SarnaWakefield notified patient feels like staying another night due to pain

## 2015-10-19 DIAGNOSIS — Z87891 Personal history of nicotine dependence: Secondary | ICD-10-CM | POA: Diagnosis not present

## 2015-10-19 DIAGNOSIS — I10 Essential (primary) hypertension: Secondary | ICD-10-CM | POA: Diagnosis not present

## 2015-10-19 DIAGNOSIS — E039 Hypothyroidism, unspecified: Secondary | ICD-10-CM | POA: Diagnosis not present

## 2015-10-19 DIAGNOSIS — K430 Incisional hernia with obstruction, without gangrene: Secondary | ICD-10-CM | POA: Diagnosis not present

## 2015-10-19 DIAGNOSIS — K219 Gastro-esophageal reflux disease without esophagitis: Secondary | ICD-10-CM | POA: Diagnosis not present

## 2015-10-19 DIAGNOSIS — E7800 Pure hypercholesterolemia, unspecified: Secondary | ICD-10-CM | POA: Diagnosis not present

## 2015-10-19 MED FILL — oxyCODONE HCL 5 MG TABS: 5 | 3 days supply | Qty: 30 | Fill #0

## 2015-10-19 NOTE — Progress Notes (Signed)
2 Days Post-Op  Subjective: No n/v tol diet, pain controlled, no flatus/bm yet  Objective: Vital signs in last 24 hours: Temp:  [98.4 F (36.9 C)-99.6 F (37.6 C)] 98.4 F (36.9 C) (05/05 0542) Pulse Rate:  [57-89] 89 (05/05 0542) Resp:  [16-19] 19 (05/05 0542) BP: (128-136)/(61-68) 132/61 mmHg (05/05 0542) SpO2:  [92 %-97 %] 92 % (05/05 0542) Last BM Date: 10/16/15  Intake/Output from previous day: 05/04 0701 - 05/05 0700 In: -  Out: 800 [Urine:800] Intake/Output this shift: Total I/O In: -  Out: 200 [Urine:200]  Resp: clear to auscultation bilaterally Cardio: regular rate and rhythm GI: soft approp tender bs present  Lab Results:  No results for input(s): WBC, HGB, HCT, PLT in the last 72 hours. BMET  Recent Labs  10/18/15 0627  NA 139  K 3.6  CL 103  CO2 27  GLUCOSE 115*  BUN 6  CREATININE 0.64  CALCIUM 8.4*   PT/INR No results for input(s): LABPROT, INR in the last 72 hours. ABG No results for input(s): PHART, HCO3 in the last 72 hours.  Invalid input(s): PCO2, PO2  Studies/Results: No results found.  Anti-infectives: Anti-infectives    Start     Dose/Rate Route Frequency Ordered Stop   10/17/15 0703  ceFAZolin (ANCEF) 2-4 GM/100ML-% IVPB    Comments:  Nina Pitts, Nina Pitts   : cabinet override      10/17/15 0703 10/17/15 1656   10/17/15 0658  ceFAZolin (ANCEF) IVPB 2g/100 mL premix     2 g 200 mL/hr over 30 Minutes Intravenous On call to O.R. 10/17/15 [id number] 10/17/15 [id number]      Assessment/Plan: POD 2 lap incisional hernia repair with mesh  Po pain meds and mm relaxants She has good bs today and no issues with po so I think fine to dc today even though not having bowel function yet  [name],Nina Pitts 10/19/2015

## 2015-10-19 NOTE — Progress Notes (Signed)
Pt discharged to home. Discharge instructions explained to pt.  Pt has no questions at the time of discharge.  Pt states she has all belongings.  IV removed.  Pt taken off the floor via wheelchair by nurse tech.

## 2015-10-19 NOTE — Care Management Note (Signed)
Case Management Note  Patient Details  Name: Nina Pitts MRN: [id number] Date of Birth: 03/29/1961  Subjective/Objective:                    Action/Plan:   Expected Discharge Date:                  Expected Discharge Plan:  Home/Self Care  In-House Referral:     Discharge planning Services  CM Consult  Post Acute Care Choice:  Durable Medical Equipment Choice offered to:  Patient  DME Arranged:  3-N-1 DME Agency:  [facility]  [facility]    [facility]:     Status of Service:  Completed, signed off  Medicare Important Message Given:    Date Medicare IM Given:    Medicare IM give by:    Date Additional Medicare IM Given:    Additional Medicare Important Message give by:     If discussed at Long Length of Stay Meetings, dates discussed:    Additional Comments:  Nina Pitts, Nina Nicholl Marie, RN 10/19/2015, 9:57 AM

## 2015-10-21 NOTE — Discharge Summary (Signed)
Physician Discharge Summary  Patient ID: Nina Pitts MRN: [id number] DOB/AGE: 1960-10-16 55 y.o.  Admit date: 10/17/2015 Discharge date: 10/21/2015  Admission Diagnoses: Hypertension Incisional hernias Hypothyroid  Discharge Diagnoses:  Active Problems:   Incarcerated ventral hernia   Discharged Condition: good  Hospital Course: [age] yof who underwent laparoscopic incisional hernia repair with mesh.  She is ambulating, pain under controlled, tolerating her diet.  She will be discharged home.   Consults:none  Significant Diagnostic Studies: none  Treatments: surgery as above  Disposition: 01-Home or Self Care     Medication List    STOP taking these medications        amoxicillin 500 MG capsule  Commonly known as:  AMOXIL      TAKE these medications        aspirin 81 MG chewable tablet  Chew 81 mg by mouth daily.     atorvastatin 20 MG tablet  Commonly known as:  LIPITOR  Take 20 mg by mouth daily.     docusate sodium 100 MG capsule  Commonly known as:  COLACE  Take 1 capsule (100 mg total) by mouth 2 (two) times daily.     levothyroxine 150 MCG tablet  Commonly known as:  SYNTHROID, LEVOTHROID  Take 150 mcg by mouth daily before breakfast.     levothyroxine 175 MCG tablet  Commonly known as:  SYNTHROID, LEVOTHROID  Take 175 mcg by mouth daily before breakfast.     losartan 50 MG tablet  Commonly known as:  COZAAR  Take 50 mg by mouth daily.     metaxalone 800 MG tablet  Commonly known as:  SKELAXIN  Take 800 mg by mouth 3 (three) times daily as needed for pain.     multivitamin tablet  Take 1 tablet by mouth daily.     oxyCODONE 5 MG immediate release tablet  Commonly known as:  Oxy IR/ROXICODONE  Take 1-2 tablets (5-10 mg total) by mouth every 4 (four) hours as needed for moderate pain.     pantoprazole 40 MG tablet  Commonly known as:  PROTONIX  Take 40 mg by mouth daily.     promethazine 12.5 MG tablet  Commonly known as:  PHENERGAN   Take 1 tablet (12.5 mg total) by mouth every 6 (six) hours as needed for nausea or vomiting.     tiZANidine 4 MG tablet  Commonly known as:  ZANAFLEX  Take 4 mg by mouth every 6 (six) hours as needed for muscle spasms.     Vitamin D (Ergocalciferol) 50000 units Caps capsule  Commonly known as:  DRISDOL  Take 50,000 Units by mouth every 7 (seven) days.           Follow-up Information    [facility] In 2 weeks.   Specialty:  General Surgery   Contact information:   [address] [place] [address] [phone]       Signed: Emelia LoronWAKEFIELD,Trudy Kory 10/21/2015, 7:36 PM

## 2015-10-26 MED FILL — HYDROCODON-APAP 5-325: 5-325 | 4 days supply | Qty: 40 | Fill #0

## 2015-11-05 MED FILL — ATORVASTATIN 20 MG TABLET: 20 | 90 days supply | Qty: 45 | Fill #0

## 2015-11-06 DIAGNOSIS — R300 Dysuria: Secondary | ICD-10-CM | POA: Diagnosis not present

## 2015-12-10 MED FILL — LOSARTAN POTASSIUM 50 MG TA: 50 | 90 days supply | Qty: 90 | Fill #0

## 2015-12-13 DIAGNOSIS — I10 Essential (primary) hypertension: Secondary | ICD-10-CM | POA: Diagnosis not present

## 2015-12-13 DIAGNOSIS — E039 Hypothyroidism, unspecified: Secondary | ICD-10-CM | POA: Diagnosis not present

## 2015-12-13 DIAGNOSIS — E559 Vitamin D deficiency, unspecified: Secondary | ICD-10-CM | POA: Diagnosis not present

## 2015-12-13 DIAGNOSIS — R252 Cramp and spasm: Secondary | ICD-10-CM | POA: Diagnosis not present

## 2015-12-13 DIAGNOSIS — E7800 Pure hypercholesterolemia, unspecified: Secondary | ICD-10-CM | POA: Diagnosis not present

## 2015-12-24 ENCOUNTER — Encounter (HOSPITAL_COMMUNITY): Payer: Self-pay | Admitting: Emergency Medicine

## 2015-12-24 ENCOUNTER — Ambulatory Visit (HOSPITAL_COMMUNITY)
Admission: EM | Admit: 2015-12-24 | Discharge: 2015-12-24 | Disposition: A | Discharge status: Home / Self Care | Payer: Medicare Other | Attending: Emergency Medicine | Admitting: Emergency Medicine

## 2015-12-24 DIAGNOSIS — H5712 Ocular pain, left eye: Secondary | ICD-10-CM

## 2015-12-24 MED ORDER — OLOPATADINE HCL 0.1 % OP SOLN: 1.0000 [drp] | Freq: Two times a day (BID) | OPHTHALMIC | Status: DC

## 2015-12-24 MED ORDER — TETRACAINE HCL 0.5 % OP SOLN
OPHTHALMIC | Status: AC
  Filled 2015-12-24: qty 2

## 2015-12-24 MED ORDER — IBUPROFEN 800 MG PO TABS: 800.0000 mg | ORAL_TABLET | Freq: Three times a day (TID) | ORAL | Status: DC

## 2015-12-24 MED ORDER — CIPROFLOXACIN HCL 0.3 % OP SOLN: OPHTHALMIC | Status: DC

## 2015-12-24 MED ORDER — EYE WASH OPHTH SOLN
OPHTHALMIC | Status: AC
  Filled 2015-12-24: qty 118

## 2015-12-24 NOTE — ED Provider Notes (Signed)
HPI  SUBJECTIVE:  Nina Pitts is a 55 y.o. female who presents with burning left periorbital eye pain for the past 4 days. She reports mild photophobia, some itching and drainage starting several days ago. States that she had welts inferior to her eye yesterday, but this has since resolved. She denies fevers, pain with extraocular movements, halos around lights. No headaches. She reports blurry vision described as "a film over my eye" but denies any double vision. She denies any facial rash. She states that she has not been rubbing her eyes. She wears glasses, but no contacts. No sick contacts with conjunctivitis. No foreign body sensation. No allergy symptoms, nasal congestion, sneezing, pain worse in the dark. No exposure to chemicals, . Symptoms are better with closing her eyes, worse with palpation. She has tried warm compresses for this. She states that her visual acuity is normally asymmetric, states the right eye stronger than the left. She has of initial appointment with an ophthalmologist on July 18, but is not sure of the practice's name. She has a past medical history of seasonal allergies, which she states is not bothering her now, hypertension. No history of diabetes, glaucoma, sickle cell, chickenpox.    Past Medical History  Diagnosis Date  . Anxiety   . Fibromyalgia   . Gallstones   . Hypertension   . IBS (irritable bowel syndrome)   . Obesity   . GERD (gastroesophageal reflux disease)   . Hypothyroidism   . Carpal tunnel syndrome   . Arthritis     "joints" (02/03/2013)  . Osteoarthritis of both knees   . Gout attack ~ 2011  . PTSD (post-traumatic stress disorder)   . Fatty liver disease, nonalcoholic dx'd "~ 1/61094/2014"    Past Surgical History  Procedure Laterality Date  . Cesarean section  1979; 1980; 1986  . Hernia [facility]  ~ [facility]   . Axillary hidradenitis excision Bilateral 1990's    with stsg  . Pilonidal cyst excision  2000?  [facility]. Cholecystectomy  02/03/2013   . Knee arthroscopy Right 1990'S  . Abdominal exploration surgery  04/27/2001    w/LOA/notes 04/27/2001 (02/03/2013)  . Bilateral salpingoophorectomy Bilateral 04/27/2001    Venise Perch/notes 04/27/2001 (02/03/2013)  . Appendectomy  ~ 1974  . Abdominal hysterectomy  ~ 2000    partial  . Cardiac catheterization  ~ 2005  . Cholecystectomy N/A 02/03/2013    Procedure: LAPAROSCOPIC CHOLECYSTECTOMY WITH INTRAOPERATIVE CHOLANGIOGRAM;  Surgeon: Emelia LoronMatthew Wakefield, MD;  [facility];  Service: General;  Laterality: N/A;  . Ercp N/A 02/09/2013    Procedure: ENDOSCOPIC RETROGRADE CHOLANGIOPANCREATOGRAPHY (ERCP) with Stent Placement;  Surgeon: Petra KubaMarc E Magod, MD;  [facility];  Service: Endoscopy;  Laterality: N/A;  . Esophagogastroduodenoscopy N/A 03/28/2013    Procedure: ESOPHAGOGASTRODUODENOSCOPY (EGD);  Surgeon: Petra KubaMarc E Magod, MD;  [facility];  Service: Endoscopy;  Laterality: N/A;  . Incisional hernia repair  10/17/2015  . Incisional hernia repair N/A 10/17/2015    Procedure: LAPAROSCOPIC INCISIONAL HERNIA WITH MESH ;  Surgeon: Emelia LoronMatthew Wakefield, MD;  [facility];  Service: General;  Laterality: N/A;  . Insertion of mesh N/A 10/17/2015    Procedure: INSERTION OF MESH;  Surgeon: Emelia LoronMatthew Wakefield, MD;  [facility];  Service: General;  Laterality: N/A;    Family History  Problem Relation Age of Onset  . Pancreatic cancer Father   . Esophageal cancer Father   . Cancer Father     esophagus, lungs  . Diabetes Mother   .  Heart disease Mother   . Cancer Paternal Grandmother     breast    Social History  Substance Use Topics  . Smoking status: Former Smoker -- 0.50 packs/day for 15 years    Types: Cigarettes    Quit date: 11/18/1998  . Smokeless tobacco: Never Used  . Alcohol Use: No    No current facility-administered medications for this encounter.  Current outpatient prescriptions:  .  aspirin 81 MG chewable tablet, Chew 81 mg by mouth daily., Disp: , Rfl:  .  atorvastatin  (LIPITOR) 20 MG tablet, Take 20 mg by mouth daily. , Disp: , Rfl:  .  ciprofloxacin (CILOXAN) 0.3 % ophthalmic solution, 1-2 drops in affected eye 4 times/day x 5 days, Disp: 5 mL, Rfl: 0 .  docusate sodium (COLACE) 100 MG capsule, Take 1 capsule (100 mg total) by mouth 2 (two) times daily., Disp: 30 capsule, Rfl: 2 .  ibuprofen (ADVIL,MOTRIN) 800 MG tablet, Take 1 tablet (800 mg total) by mouth 3 (three) times daily., Disp: 30 tablet, Rfl: 0 .  levothyroxine (SYNTHROID, LEVOTHROID) 150 MCG tablet, Take 150 mcg by mouth daily before breakfast., Disp: , Rfl:  .  levothyroxine (SYNTHROID, LEVOTHROID) 175 MCG tablet, Take 175 mcg by mouth daily before breakfast., Disp: , Rfl:  .  losartan (COZAAR) 50 MG tablet, Take 50 mg by mouth daily., Disp: , Rfl:  .  metaxalone (SKELAXIN) 800 MG tablet, Take 800 mg by mouth 3 (three) times daily as needed for pain. , Disp: , Rfl:  .  Multiple Vitamin (MULTIVITAMIN) tablet, Take 1 tablet by mouth daily., Disp: , Rfl:  .  olopatadine (PATANOL) 0.1 % ophthalmic solution, Place 1 drop into both eyes 2 (two) times daily., Disp: 5 mL, Rfl: 0 .  oxyCODONE (OXY IR/ROXICODONE) 5 MG immediate release tablet, Take 1-2 tablets (5-10 mg total) by mouth every 4 (four) hours as needed for moderate pain., Disp: 30 tablet, Rfl: 0 .  pantoprazole (PROTONIX) 40 MG tablet, Take 40 mg by mouth daily., Disp: , Rfl:  .  promethazine (PHENERGAN) 12.5 MG tablet, Take 1 tablet (12.5 mg total) by mouth every 6 (six) hours as needed for nausea or vomiting., Disp: 15 tablet, Rfl: 0 .  tiZANidine (ZANAFLEX) 4 MG tablet, Take 4 mg by mouth every 6 (six) hours as needed for muscle spasms., Disp: , Rfl:  .  Vitamin D, Ergocalciferol, (DRISDOL) 50000 units CAPS capsule, Take 50,000 Units by mouth every 7 (seven) days., Disp: , Rfl:   Allergies  Allergen Reactions  . Percocet [Oxycodone-Acetaminophen] Nausea And Vomiting  . Aspirin Nausea And Vomiting    Adult strength only. Patient stated that  she can take lower doses of aspirin  . Robaxin [Methocarbamol] Itching  . Vicodin [Hydrocodone-Acetaminophen] Itching  . Tramadol Other (See Comments)    Has bad headaches after taking     ROS  As noted in HPI.   Physical Exam  BP 131/84 mmHg  Pulse 64  Temp(Src) 98.1 F (36.7 C) (Oral)  Resp 16  SpO2 96%  Constitutional: Well developed, well nourished, no acute distress Eyes:  EOMI, Positive mild conjunctival injection medial left eye. No consensual photophobia. Mild direct photophobia. Mild nontender erythema inferior to left eye. No periorbital tenderness, swelling, increased temperature. No evidence of entrapment. No pain with EOMs. No abrasion or dendrites seen on fluorescein exam. No proptosis.   Visual Acuity  Right Eye Distance: 20/30 (using corrective lenses) Left Eye Distance: 20/40 (using corrective lenses) Bilateral Distance: 20/30 (using  corrective lenses)  HENT: Normocephalic, atraumatic,mucus membranes moist. External ear canal, left TM within normal limits. Respiratory: Normal inspiratory effort Cardiovascular: Normal rate GI: nondistended skin: No facial rash, skin intact Musculoskeletal: no deformities Neurologic: Alert & oriented x 3, no focal neuro deficits Psychiatric: Speech and behavior appropriate   ED Course   Medications - No data to display  Orders Placed This Encounter  Procedures  . Visual acuity screening    Standing Status: Standing     Number of Occurrences: 1     Standing Expiration Date:     No results found for this or any previous visit (from the past 24 hour(s)). No results found.  ED Clinical Impression  Periorbital pain, left  ED Assessment/Plan  Concern for shingles, however, the rash has not yet manifested itself. advised patient that she will need to return if a rash shows up. No evidence of glaucoma, periorbital cellulitis, orbital cellulitis. Will Try ibuprofen 800 mg. As she does have a history of allergies,  this could be allergies, so will have her start some Claritin, Patanol, cool compresses. We'll also try some antibiotic eyedrops if this does not help. We'll have her follow-up with Dr. Edrick Oh, optho on call at [facility] ophthalmology in several days.  Discussed MDM, plan and followup with patient. Discussed sn/sx that should prompt return to the ED. Patient agrees with plan.   *This clinic note was created using Dragon dictation software. Therefore, there may be occasional mistakes despite careful proofreading.  ?   Domenick Gong, MD 12/24/15 1524

## 2015-12-24 NOTE — ED Notes (Signed)
Patient reports left eye started bothering her on Thursday 7/6.  Yesterday, left eye started tearing, hurting more, and area around left eye is swollen and red.  Patient noticed right eye was "stuck together today" patient reports she has an appt with eye doctor for July 18

## 2015-12-24 NOTE — Discharge Instructions (Signed)
Try some cool compresses, and some Claritin or Zyrtec. follow-up with ophthalmology in 1-2 days. Follow up with your doctor or return here if you start to have a rash. Go to the ER for the signs and symptoms we discussed

## 2016-01-15 MED FILL — IBUPROFEN 800 MG TABLET: 800 | 10 days supply | Qty: 30 | Fill #0

## 2016-01-16 DIAGNOSIS — Z0000 Encounter for general adult medical examination without abnormal findings: Secondary | ICD-10-CM | POA: Diagnosis not present

## 2016-01-16 DIAGNOSIS — E7800 Pure hypercholesterolemia, unspecified: Secondary | ICD-10-CM | POA: Diagnosis not present

## 2016-01-16 DIAGNOSIS — E039 Hypothyroidism, unspecified: Secondary | ICD-10-CM | POA: Diagnosis not present

## 2016-01-16 DIAGNOSIS — I10 Essential (primary) hypertension: Secondary | ICD-10-CM | POA: Diagnosis not present

## 2016-02-04 ENCOUNTER — Emergency Department (HOSPITAL_COMMUNITY): Payer: Self-pay

## 2016-02-04 ENCOUNTER — Encounter (HOSPITAL_COMMUNITY): Payer: Self-pay

## 2016-02-04 ENCOUNTER — Emergency Department (HOSPITAL_COMMUNITY)
Admission: EM | Admit: 2016-02-04 | Discharge: 2016-02-04 | Disposition: A | Discharge status: Home / Self Care | Payer: Self-pay | Attending: Emergency Medicine | Admitting: Emergency Medicine

## 2016-02-04 DIAGNOSIS — Y929 Unspecified place or not applicable: Secondary | ICD-10-CM | POA: Insufficient documentation

## 2016-02-04 DIAGNOSIS — Y999 Unspecified external cause status: Secondary | ICD-10-CM | POA: Insufficient documentation

## 2016-02-04 DIAGNOSIS — Y9301 Activity, walking, marching and hiking: Secondary | ICD-10-CM | POA: Insufficient documentation

## 2016-02-04 DIAGNOSIS — E039 Hypothyroidism, unspecified: Secondary | ICD-10-CM | POA: Insufficient documentation

## 2016-02-04 DIAGNOSIS — Z87891 Personal history of nicotine dependence: Secondary | ICD-10-CM | POA: Insufficient documentation

## 2016-02-04 DIAGNOSIS — W010XXA Fall on same level from slipping, tripping and stumbling without subsequent striking against object, initial encounter: Secondary | ICD-10-CM | POA: Insufficient documentation

## 2016-02-04 DIAGNOSIS — M25562 Pain in left knee: Secondary | ICD-10-CM | POA: Insufficient documentation

## 2016-02-04 DIAGNOSIS — Z7982 Long term (current) use of aspirin: Secondary | ICD-10-CM | POA: Insufficient documentation

## 2016-02-04 DIAGNOSIS — I10 Essential (primary) hypertension: Secondary | ICD-10-CM | POA: Insufficient documentation

## 2016-02-04 DIAGNOSIS — Z79899 Other long term (current) drug therapy: Secondary | ICD-10-CM | POA: Insufficient documentation

## 2016-02-04 DIAGNOSIS — M25462 Effusion, left knee: Secondary | ICD-10-CM | POA: Diagnosis not present

## 2016-02-04 MED ORDER — NAPROXEN 500 MG PO TABS: 500.0000 mg | ORAL_TABLET | Freq: Two times a day (BID) | ORAL | 0 refills | Status: DC

## 2016-02-04 NOTE — ED Triage Notes (Signed)
Pt states walking, tripped on tile and tripped. Pt complaining L knee pain. Pt denies hitting floor, states feels like knee twisted.

## 2016-02-04 NOTE — ED Provider Notes (Signed)
[facility]-EMERGENCY DEPT Provider Note   CSN: [id number] Arrival date & time: 02/04/16  2007  By signing my name below, I, Phillis Haggis, attest that this documentation has been prepared under the direction and in the presence of Felicie Morn, NP-C.  Electronically Signed: Phillis Haggis, ED Scribe. 02/04/16. 9:20 PM.  History   Chief Complaint Chief Complaint  Patient presents with  . Knee Pain   The history is provided by the patient. No language interpreter was used.  Knee Pain   This is a new problem. The current episode started yesterday. The problem occurs constantly. The problem has been gradually worsening. The pain is present in the left knee. The quality of the pain is described as aching. The pain is mild. Pertinent negatives include no numbness. She has tried nothing for the symptoms.   HPI Comments: Nina Pitts is a 55 y.o. Female with a hx of arthritis, gout, HTN, and fibromyalgia who presents to the Emergency Department complaining of gradually worsening, burning left knee pain onset one day ago. Pt reports that she was walking, when she tripped on tile. She states that it feels like her knee twisted. She reports worsening pain with weight bearing and walking up stairs. She denies hitting her knee on the ground. She has not tried anything for her pain. She denies swelling, numbness, or weakness.  Past Medical History:  Diagnosis Date  . Anxiety   . Arthritis    "joints" (02/03/2013)  . Carpal tunnel syndrome   . Fatty liver disease, nonalcoholic dx'd "~ 07/1306"  . Fibromyalgia   . Gallstones   . GERD (gastroesophageal reflux disease)   . Gout attack ~ 2011  . Hypertension   . Hypothyroidism   . IBS (irritable bowel syndrome)   . Obesity   . Osteoarthritis of both knees   . PTSD (post-traumatic stress disorder)     Patient Active Problem List   Diagnosis Date Noted  . Incarcerated ventral hernia 10/17/2015  . Aortic atherosclerosis (HCC) 03/21/2015  . Fever  02/08/2013  . Abdominal pain 02/08/2013  . IBS (irritable bowel syndrome) 11/17/2012  . Unspecified hypothyroidism 11/17/2012  . Obesity, unspecified 11/17/2012    Past Surgical History:  Procedure Laterality Date  . ABDOMINAL EXPLORATION SURGERY  04/27/2001   w/LOA/notes 04/27/2001 (02/03/2013)  . ABDOMINAL HYSTERECTOMY  ~ 2000   partial  . APPENDECTOMY  ~ 1974  . AXILLARY HIDRADENITIS EXCISION Bilateral 1990's   with stsg  . BILATERAL SALPINGOOPHORECTOMY Bilateral 04/27/2001   Maisley Perch 04/27/2001 (02/03/2013)  . CARDIAC CATHETERIZATION  ~ 2005  . CESAREAN SECTION  1979; 1980; 1986  . CHOLECYSTECTOMY  02/03/2013  . CHOLECYSTECTOMY N/A 02/03/2013   Procedure: LAPAROSCOPIC CHOLECYSTECTOMY WITH INTRAOPERATIVE CHOLANGIOGRAM;  Surgeon: [name], MD;  [facility];  Service: General;  Laterality: N/A;  . ERCP N/A 02/09/2013   Procedure: ENDOSCOPIC RETROGRADE CHOLANGIOPANCREATOGRAPHY (ERCP) with Stent Placement;  Surgeon: [name], MD;  Location: [facility] OR;  Service: Endoscopy;  Laterality: N/A;  . ESOPHAGOGASTRODUODENOSCOPY N/A 03/28/2013   Procedure: ESOPHAGOGASTRODUODENOSCOPY (EGD);  Surgeon: [name], MD;  [facility];  Service: Endoscopy;  Laterality: N/A;  . HERNIA REPAIR  ~ 1974   [facility]   . INCISIONAL HERNIA REPAIR  10/17/2015  . INCISIONAL HERNIA REPAIR N/A 10/17/2015   Procedure: LAPAROSCOPIC INCISIONAL HERNIA WITH MESH ;  Surgeon: [name], MD;  [facility];  Service: General;  Laterality: N/A;  . INSERTION OF MESH N/A 10/17/2015   Procedure: INSERTION OF MESH;  Surgeon:  Emelia LoronMatthew Wakefield, MD;  [facility];  Service: General;  Laterality: N/A;  . KNEE ARTHROSCOPY Right 1990'S  . PILONIDAL CYST EXCISION  2000?    OB History    No data available       Home Medications    Prior to Admission medications   Medication Sig Start Date End Date Taking? Authorizing Provider  aspirin 81 MG chewable tablet Chew 81 mg by mouth daily.    Historical  Provider, MD  atorvastatin (LIPITOR) 20 MG tablet Take 20 mg by mouth daily.  02/16/13   Historical Provider, MD  ciprofloxacin (CILOXAN) 0.3 % ophthalmic solution 1-2 drops in affected eye 4 times/day x 5 days 12/24/15   Domenick GongAshley Mortenson, MD  docusate sodium (COLACE) 100 MG capsule Take 1 capsule (100 mg total) by mouth 2 (two) times daily. 10/18/15   Emelia LoronMatthew Wakefield, MD  ibuprofen (ADVIL,MOTRIN) 800 MG tablet Take 1 tablet (800 mg total) by mouth 3 (three) times daily. 12/24/15   Domenick GongAshley Mortenson, MD  levothyroxine (SYNTHROID, LEVOTHROID) 150 MCG tablet Take 150 mcg by mouth daily before breakfast.    Historical Provider, MD  levothyroxine (SYNTHROID, LEVOTHROID) 175 MCG tablet Take 175 mcg by mouth daily before breakfast.    Historical Provider, MD  losartan (COZAAR) 50 MG tablet Take 50 mg by mouth daily.    Historical Provider, MD  metaxalone (SKELAXIN) 800 MG tablet Take 800 mg by mouth 3 (three) times daily as needed for pain.     Historical Provider, MD  Multiple Vitamin (MULTIVITAMIN) tablet Take 1 tablet by mouth daily.    Historical Provider, MD  olopatadine (PATANOL) 0.1 % ophthalmic solution Place 1 drop into both eyes 2 (two) times daily. 12/24/15   Domenick GongAshley Mortenson, MD  oxyCODONE (OXY IR/ROXICODONE) 5 MG immediate release tablet Take 1-2 tablets (5-10 mg total) by mouth every 4 (four) hours as needed for moderate pain. 10/18/15   Emelia LoronMatthew Wakefield, MD  pantoprazole (PROTONIX) 40 MG tablet Take 40 mg by mouth daily.    Historical Provider, MD  promethazine (PHENERGAN) 12.5 MG tablet Take 1 tablet (12.5 mg total) by mouth every 6 (six) hours as needed for nausea or vomiting. 10/18/15   Emelia LoronMatthew Wakefield, MD  tiZANidine (ZANAFLEX) 4 MG tablet Take 4 mg by mouth every 6 (six) hours as needed for muscle spasms.    Historical Provider, MD  Vitamin D, Ergocalciferol, (DRISDOL) 50000 units CAPS capsule Take 50,000 Units by mouth every 7 (seven) days.    Historical Provider, MD    Family  History Family History  Problem Relation Age of Onset  . Pancreatic cancer Father   . Esophageal cancer Father   . Cancer Father     esophagus, lungs  . Diabetes Mother   . Heart disease Mother   . Cancer Paternal Grandmother     breast    Social History Social History  Substance Use Topics  . Smoking status: Former Smoker    Packs/day: 0.50    Years: 15.00    Types: Cigarettes    Quit date: 11/18/1998  . Smokeless tobacco: Never Used  . Alcohol use No     Allergies   Percocet [oxycodone-acetaminophen]; Aspirin; Robaxin [methocarbamol]; Vicodin [hydrocodone-acetaminophen]; and Tramadol   Review of Systems Review of Systems  Musculoskeletal: Positive for arthralgias. Negative for joint swelling.  Skin: Negative for rash and wound.  Neurological: Negative for weakness and numbness.  All other systems reviewed and are negative.  Physical Exam Updated Vital Signs BP 118/86 (BP Location:  Left Arm)   Pulse 78   Temp 98.3 F (36.8 C) (Oral)   Resp 16   SpO2 95%   Physical Exam  Constitutional: She is oriented to person, place, and time. She appears well-developed and well-nourished. No distress.  HENT:  Head: Normocephalic and atraumatic.  Mouth/Throat: Oropharynx is clear and moist. No oropharyngeal exudate.  Eyes: Conjunctivae and EOM are normal. Pupils are equal, round, and reactive to light.  Neck: Normal range of motion. Neck supple.  Cardiovascular: Normal rate and regular rhythm.   Pulmonary/Chest: Effort normal.  Musculoskeletal: Normal range of motion.       Left knee: Tenderness found.  Tenderness across patella, proximal to distal  Neurological: She is alert and oriented to person, place, and time.  Skin: Skin is warm and dry.  Psychiatric: She has a normal mood and affect. Her behavior is normal.  Nursing note and vitals reviewed.    ED Treatments / Results  DIAGNOSTIC STUDIES: Oxygen Saturation is 95% on RA, normal by my interpretation.     COORDINATION OF CARE: 9:18 PM-Discussed treatment plan which includes x-ray with pt at bedside and pt agreed to plan.    Labs (all labs ordered are listed, but only abnormal results are displayed) Labs Reviewed - No data to display  EKG  EKG Interpretation None       Radiology Dg Knee Complete 4 Views Left  Result Date: 02/04/2016 CLINICAL DATA:  Tripped, twisting LEFT knee yesterday. Anterior knee pain, burning and medial knee sensation. EXAM: LEFT KNEE - COMPLETE 4+ VIEW COMPARISON:  LEFT knee radiograph September 01, 2007 FINDINGS: No evidence of fracture, dislocation. Small loose body again noted. Mild tibial spine peaking. Moderate tricompartmental joint space narrowing with marginal osteophytes. Quadriceps insertional tendinopathy. No destructive bony lesions. Small suprapatellar joint effusion. IMPRESSION: Small suprapatellar joint effusion. No acute fracture deformity or dislocation. Moderate tricompartmental osteoarthrosis. Electronically Signed   By: Awilda Metroourtnay  Bloomer M.D.   On: 02/04/2016 21:14    Procedures Procedures (including critical care time)  Medications Ordered in ED Medications - No data to display   Initial Impression / Assessment and Plan / ED Course  I have reviewed the triage vital signs and the nursing notes.  Pertinent labs & imaging results that were available during my care of the patient were reviewed by me and considered in my medical decision making (see chart for details).  Clinical Course      Final Clinical Impressions(s) / ED Diagnoses   Patient X-Ray negative for obvious fracture or dislocation. Pain managed in ED. Pt advised to follow up with orthopedics if symptoms persist for possibility of missed fracture diagnosis. Patient given brace while in ED, conservative therapy recommended and discussed. Patient will be dc home & is agreeable with above plan.  Final diagnoses:  None  Left knee pain.   I personally performed the services  described in this documentation, which was scribed in my presence. The recorded information has been reviewed and is accurate.    New Prescriptions New Prescriptions   No medications on file     Felicie MornDavid Everlie Eble, NP 02/05/16 [id number]    Shon Batonourtney F Horton, MD 02/05/16 [phone]

## 2016-02-04 NOTE — ED Notes (Signed)
Pt st;s she tripped and fell yesterday landing on left knee.  Pt c/o pain to this area

## 2016-02-08 MED FILL — NAPROXEN 500 MG TABLET: 500 | 10 days supply | Qty: 20 | Fill #0

## 2016-02-19 MED FILL — ATORVASTATIN 20 MG TABLET: 20 | 90 days supply | Qty: 45 | Fill #0

## 2016-02-20 DIAGNOSIS — B9789 Other viral agents as the cause of diseases classified elsewhere: Secondary | ICD-10-CM | POA: Diagnosis not present

## 2016-02-20 DIAGNOSIS — E039 Hypothyroidism, unspecified: Secondary | ICD-10-CM | POA: Diagnosis not present

## 2016-02-20 DIAGNOSIS — E7800 Pure hypercholesterolemia, unspecified: Secondary | ICD-10-CM | POA: Diagnosis not present

## 2016-02-20 DIAGNOSIS — J069 Acute upper respiratory infection, unspecified: Secondary | ICD-10-CM | POA: Diagnosis not present

## 2016-02-20 MED FILL — CHERATUSSIN AC SYRUP: 100-10 | 4 days supply | Qty: 120 | Fill #0

## 2016-03-10 MED FILL — LOSARTAN POTASSIUM 50 MG TA: 50 | 90 days supply | Qty: 90 | Fill #1

## 2016-03-13 MED FILL — SYNTHROID 150 MCG TABLET: 150 | 30 days supply | Qty: 30 | Fill #0

## 2016-04-04 DIAGNOSIS — H9209 Otalgia, unspecified ear: Secondary | ICD-10-CM | POA: Diagnosis not present

## 2016-04-04 DIAGNOSIS — G5680 Other specified mononeuropathies of unspecified upper limb: Secondary | ICD-10-CM | POA: Diagnosis not present

## 2016-04-04 DIAGNOSIS — Z23 Encounter for immunization: Secondary | ICD-10-CM | POA: Diagnosis not present

## 2016-04-04 DIAGNOSIS — H6593 Unspecified nonsuppurative otitis media, bilateral: Secondary | ICD-10-CM | POA: Diagnosis not present

## 2016-04-04 DIAGNOSIS — J069 Acute upper respiratory infection, unspecified: Secondary | ICD-10-CM | POA: Diagnosis not present

## 2016-04-16 MED FILL — SYNTHROID 150 MCG TABLET: 150 | 30 days supply | Qty: 30 | Fill #1

## 2016-05-19 MED FILL — SYNTHROID 150 MCG TABLET: 150 | 30 days supply | Qty: 30 | Fill #2

## 2016-05-19 MED FILL — ATORVASTATIN 20 MG TABLET: 20 | 90 days supply | Qty: 45 | Fill #0

## 2016-06-13 MED FILL — LOSARTAN POTASSIUM 50 MG TA: 50 | 90 days supply | Qty: 90 | Fill #2

## 2016-07-15 DIAGNOSIS — E7800 Pure hypercholesterolemia, unspecified: Secondary | ICD-10-CM | POA: Diagnosis not present

## 2016-07-15 DIAGNOSIS — R6889 Other general symptoms and signs: Secondary | ICD-10-CM | POA: Diagnosis not present

## 2016-07-15 DIAGNOSIS — R509 Fever, unspecified: Secondary | ICD-10-CM | POA: Diagnosis not present

## 2016-07-15 DIAGNOSIS — J181 Lobar pneumonia, unspecified organism: Secondary | ICD-10-CM | POA: Diagnosis not present

## 2016-07-15 DIAGNOSIS — J069 Acute upper respiratory infection, unspecified: Secondary | ICD-10-CM | POA: Diagnosis not present

## 2016-07-15 MED FILL — AZITHROMYCIN 250 MG TABLET: 250 | 5 days supply | Qty: 6 | Fill #0

## 2016-07-22 DIAGNOSIS — J069 Acute upper respiratory infection, unspecified: Secondary | ICD-10-CM | POA: Diagnosis not present

## 2016-07-22 DIAGNOSIS — J181 Lobar pneumonia, unspecified organism: Secondary | ICD-10-CM | POA: Diagnosis not present

## 2016-07-22 DIAGNOSIS — R6889 Other general symptoms and signs: Secondary | ICD-10-CM | POA: Diagnosis not present

## 2016-07-22 DIAGNOSIS — R509 Fever, unspecified: Secondary | ICD-10-CM | POA: Diagnosis not present

## 2016-07-25 DIAGNOSIS — E7800 Pure hypercholesterolemia, unspecified: Secondary | ICD-10-CM | POA: Diagnosis not present

## 2016-07-25 DIAGNOSIS — J189 Pneumonia, unspecified organism: Secondary | ICD-10-CM | POA: Diagnosis not present

## 2016-07-25 DIAGNOSIS — E039 Hypothyroidism, unspecified: Secondary | ICD-10-CM | POA: Diagnosis not present

## 2016-08-26 MED FILL — ATORVASTATIN 20 MG TABLET: 20 | 90 days supply | Qty: 45 | Fill #0

## 2016-09-08 MED FILL — LOSARTAN POTASSIUM 50 MG TA: 50 | 90 days supply | Qty: 90 | Fill #0

## 2016-09-10 DIAGNOSIS — R778 Other specified abnormalities of plasma proteins: Secondary | ICD-10-CM | POA: Diagnosis not present

## 2016-09-10 DIAGNOSIS — J189 Pneumonia, unspecified organism: Secondary | ICD-10-CM | POA: Diagnosis not present

## 2016-09-10 DIAGNOSIS — E039 Hypothyroidism, unspecified: Secondary | ICD-10-CM | POA: Diagnosis not present

## 2016-09-22 ENCOUNTER — Other Ambulatory Visit: Payer: Self-pay | Admitting: Internal Medicine

## 2016-09-22 DIAGNOSIS — Z1231 Encounter for screening mammogram for malignant neoplasm of breast: Secondary | ICD-10-CM

## 2016-09-23 ENCOUNTER — Ambulatory Visit
Admission: RE | Admit: 2016-09-23 | Discharge: 2016-09-23 | Disposition: A | Discharge status: Home / Self Care | Payer: Medicare Other | Source: Ambulatory Visit | Attending: Internal Medicine | Admitting: Internal Medicine

## 2016-09-23 DIAGNOSIS — Z1231 Encounter for screening mammogram for malignant neoplasm of breast: Secondary | ICD-10-CM | POA: Diagnosis not present

## 2016-09-24 MED FILL — tiZANidine HCL 4 MG TABS: 4 | 30 days supply | Qty: 90 | Fill #0

## 2016-09-25 ENCOUNTER — Ambulatory Visit: Payer: Self-pay

## 2016-09-29 DIAGNOSIS — J45909 Unspecified asthma, uncomplicated: Secondary | ICD-10-CM | POA: Diagnosis not present

## 2016-09-29 MED FILL — SYNTHROID 150 MCG TABLET: 150 | 30 days supply | Qty: 30 | Fill #3

## 2016-09-29 MED FILL — AZITHROMYCIN 250 MG TABLET: 250 | 5 days supply | Qty: 6 | Fill #0

## 2016-11-06 MED FILL — SYNTHROID 150 MCG TABLET: 150 | 30 days supply | Qty: 30 | Fill #4

## 2016-11-27 MED FILL — ATORVASTATIN 20 MG TABLET: 20 | 90 days supply | Qty: 45 | Fill #1

## 2016-12-08 ENCOUNTER — Encounter (HOSPITAL_COMMUNITY): Payer: Self-pay

## 2016-12-08 DIAGNOSIS — Z87891 Personal history of nicotine dependence: Secondary | ICD-10-CM | POA: Insufficient documentation

## 2016-12-08 DIAGNOSIS — Z79899 Other long term (current) drug therapy: Secondary | ICD-10-CM | POA: Insufficient documentation

## 2016-12-08 DIAGNOSIS — T7840XA Allergy, unspecified, initial encounter: Secondary | ICD-10-CM | POA: Insufficient documentation

## 2016-12-08 DIAGNOSIS — E039 Hypothyroidism, unspecified: Secondary | ICD-10-CM | POA: Diagnosis not present

## 2016-12-08 DIAGNOSIS — T781XXA Other adverse food reactions, not elsewhere classified, initial encounter: Secondary | ICD-10-CM | POA: Diagnosis not present

## 2016-12-08 DIAGNOSIS — Z7982 Long term (current) use of aspirin: Secondary | ICD-10-CM | POA: Insufficient documentation

## 2016-12-08 DIAGNOSIS — I10 Essential (primary) hypertension: Secondary | ICD-10-CM | POA: Diagnosis not present

## 2016-12-08 NOTE — ED Triage Notes (Signed)
Pt states she drank a mango slushie and started having throat burning, cold sweats, abd pain. Pt states her and 3 others had same reaction after drinking it. PT has no resp distress.

## 2016-12-09 ENCOUNTER — Emergency Department (HOSPITAL_COMMUNITY)
Admission: EM | Admit: 2016-12-09 | Discharge: 2016-12-09 | Disposition: A | Discharge status: Home / Self Care | Payer: Medicare Other | Attending: Emergency Medicine | Admitting: Emergency Medicine

## 2016-12-09 DIAGNOSIS — T7840XA Allergy, unspecified, initial encounter: Secondary | ICD-10-CM

## 2016-12-09 MED ORDER — GI COCKTAIL ~~LOC~~
30.0000 mL | Freq: Once | ORAL | Status: AC
  Administered 2016-12-09: 30 mL via ORAL
  Filled 2016-12-09: qty 30

## 2016-12-09 MED ORDER — DIPHENHYDRAMINE HCL 25 MG PO TABS: 25.0000 mg | ORAL_TABLET | Freq: Four times a day (QID) | ORAL | 0 refills | Status: DC | PRN

## 2016-12-09 NOTE — ED Notes (Signed)
See EDP assessment 

## 2016-12-09 NOTE — ED Provider Notes (Signed)
[facility] Provider Note   CSN: [id number] Arrival date & time: 12/08/16  2207     History   Chief Complaint Chief Complaint  Patient presents with  . Allergic Reaction    HPI Nina Pitts is a 56 y.o. female with a hx of Anxiety, arthritis, fibromyalgia, GERD presents to the Emergency Department complaining of gradual, now improved feeling of throat burning after drinking a mango slushy onset at 7:30 PM. Patient reports she and her friend stopped by a gas station, but a slushy and began drinking and lying. She reports that while standing in line she began to feel her throat burning. Her friend and an additional customer complained this pain.    The history is provided by the patient. No language interpreter was used.    Past Medical History:  Diagnosis Date  . Anxiety   . Arthritis    "joints" (02/03/2013)  . Carpal tunnel syndrome   . Fatty liver disease, nonalcoholic dx'd "~ 12/8293"  . Fibromyalgia   . Gallstones   . GERD (gastroesophageal reflux disease)   . Gout attack ~ 2011  . Hypertension   . Hypothyroidism   . IBS (irritable bowel syndrome)   . Obesity   . Osteoarthritis of both knees   . PTSD (post-traumatic stress disorder)     Patient Active Problem List   Diagnosis Date Noted  . Incarcerated ventral hernia 10/17/2015  . Aortic atherosclerosis (HCC) 03/21/2015  . Fever 02/08/2013  . Abdominal pain 02/08/2013  . IBS (irritable bowel syndrome) 11/17/2012  . Unspecified hypothyroidism 11/17/2012  . Obesity, unspecified 11/17/2012    Past Surgical History:  Procedure Laterality Date  . ABDOMINAL EXPLORATION SURGERY  04/27/2001   w/LOA/notes 04/27/2001 (02/03/2013)  . ABDOMINAL HYSTERECTOMY  ~ 2000   partial  . APPENDECTOMY  ~ 1974  . AXILLARY HIDRADENITIS EXCISION Bilateral 1990's   with stsg  . BILATERAL SALPINGOOPHORECTOMY Bilateral 04/27/2001   [place] Perch 04/27/2001 (02/03/2013)  . CARDIAC CATHETERIZATION  ~ 2005  . CESAREAN SECTION   1979; 1980; 1986  . CHOLECYSTECTOMY  02/03/2013  . CHOLECYSTECTOMY N/A 02/03/2013   Procedure: LAPAROSCOPIC CHOLECYSTECTOMY WITH INTRAOPERATIVE CHOLANGIOGRAM;  Surgeon: [name], MD;  Location: [facility] OR;  Service: General;  Laterality: N/A;  . ERCP N/A 02/09/2013   Procedure: ENDOSCOPIC RETROGRADE CHOLANGIOPANCREATOGRAPHY (ERCP) with Stent Placement;  Surgeon: [name], MD;  Location: [facility] OR;  Service: Endoscopy;  Laterality: N/A;  . ESOPHAGOGASTRODUODENOSCOPY N/A 03/28/2013   Procedure: ESOPHAGOGASTRODUODENOSCOPY (EGD);  Surgeon: [name], MD;  Location: [facility];  Service: Endoscopy;  Laterality: N/A;  . HERNIA REPAIR  ~ 1974   [facility]   . INCISIONAL HERNIA REPAIR  10/17/2015  . INCISIONAL HERNIA REPAIR N/A 10/17/2015   Procedure: LAPAROSCOPIC INCISIONAL HERNIA WITH MESH ;  Surgeon: [name], MD;  Location: [facility] OR;  Service: General;  Laterality: N/A;  . INSERTION OF MESH N/A 10/17/2015   Procedure: INSERTION OF MESH;  Surgeon: [name], MD;  Location: [facility] OR;  Service: General;  Laterality: N/A;  . KNEE ARTHROSCOPY Right 1990'S  . PILONIDAL CYST EXCISION  2000?    OB History    No data available       Home Medications    Prior to Admission medications   Medication Sig Start Date End Date Taking? Authorizing Provider  aspirin 81 MG chewable tablet Chew 81 mg by mouth daily.    [provider]  atorvastatin (LIPITOR) 20 MG tablet Take 20 mg by mouth daily.  02/16/13  [provider]  ciprofloxacin (CILOXAN) 0.3 % ophthalmic solution 1-2 drops in affected eye 4 times/day x 5 days 12/24/15   Domenick Gong, MD  diphenhydrAMINE (BENADRYL) 25 MG tablet Take 1 tablet (25 mg total) by mouth every 6 (six) hours as needed for itching (Rash). 12/09/16   Slaton Reaser, Dahlia Client, PA-C  docusate sodium (COLACE) 100 MG capsule Take 1 capsule (100 mg total) by mouth 2 (two) times daily. 10/18/15   [name], MD  ibuprofen (ADVIL,MOTRIN) 800 MG tablet  Take 1 tablet (800 mg total) by mouth 3 (three) times daily. 12/24/15   Domenick Gong, MD  levothyroxine (SYNTHROID, LEVOTHROID) 150 MCG tablet Take 150 mcg by mouth daily before breakfast.    [provider]  levothyroxine (SYNTHROID, LEVOTHROID) 175 MCG tablet Take 175 mcg by mouth daily before breakfast.    [provider]  losartan (COZAAR) 50 MG tablet Take 50 mg by mouth daily.    [provider]  metaxalone (SKELAXIN) 800 MG tablet Take 800 mg by mouth 3 (three) times daily as needed for pain.     [provider]  Multiple Vitamin (MULTIVITAMIN) tablet Take 1 tablet by mouth daily.    [provider]  naproxen (NAPROSYN) 500 MG tablet Take 1 tablet (500 mg total) by mouth 2 (two) times daily. 02/04/16   Felicie Morn, NP  olopatadine (PATANOL) 0.1 % ophthalmic solution Place 1 drop into both eyes 2 (two) times daily. 12/24/15   Domenick Gong, MD  oxyCODONE (OXY IR/ROXICODONE) 5 MG immediate release tablet Take 1-2 tablets (5-10 mg total) by mouth every 4 (four) hours as needed for moderate pain. 10/18/15   [name], MD  pantoprazole (PROTONIX) 40 MG tablet Take 40 mg by mouth daily.    [provider]  promethazine (PHENERGAN) 12.5 MG tablet Take 1 tablet (12.5 mg total) by mouth every 6 (six) hours as needed for nausea or vomiting. 10/18/15   [name], MD  tiZANidine (ZANAFLEX) 4 MG tablet Take 4 mg by mouth every 6 (six) hours as needed for muscle spasms.    [provider]  Vitamin D, Ergocalciferol, (DRISDOL) 50000 units CAPS capsule Take 50,000 Units by mouth every 7 (seven) days.    [provider]    Family History Family History  Problem Relation Age of Onset  . Pancreatic cancer Father   . Esophageal cancer Father   . Cancer Father        esophagus, lungs  . Diabetes Mother   . Heart disease Mother   . Cancer Paternal Grandmother        breast    Social History Social History    Substance Use Topics  . Smoking status: Former Smoker    Packs/day: 0.50    Years: 15.00    Types: Cigarettes    Quit date: 11/18/1998  . Smokeless tobacco: Never Used  . Alcohol use No     Allergies   Percocet [oxycodone-acetaminophen]; Aspirin; Robaxin [methocarbamol]; Vicodin [hydrocodone-acetaminophen]; and Tramadol   Review of Systems Review of Systems  Constitutional: Negative for appetite change, diaphoresis, fatigue, fever and unexpected weight change.  HENT: Positive for sore throat. Negative for mouth sores.   Eyes: Negative for visual disturbance.  Respiratory: Negative for cough, chest tightness, shortness of breath and wheezing.   Cardiovascular: Negative for chest pain.  Gastrointestinal: Positive for nausea. Negative for abdominal pain, constipation, diarrhea and vomiting.  Endocrine: Negative for polydipsia, polyphagia and polyuria.  Genitourinary: Negative for dysuria, frequency, hematuria and  urgency.  Musculoskeletal: Negative for back pain and neck stiffness.  Skin: Negative for rash.  Allergic/Immunologic: Negative for immunocompromised state.  Neurological: Negative for syncope, light-headedness and headaches.  Hematological: Does not bruise/bleed easily.  Psychiatric/Behavioral: Negative for sleep disturbance. The patient is not nervous/anxious.      Physical Exam Updated Vital Signs BP (!) 178/76 (BP Location: Right Arm)   Pulse 61   Temp 98.1 F (36.7 C) (Oral)   Resp 18   Ht 5\' 4"  (1.626 m)   Wt 105.2 kg (232 lb)   SpO2 96%   BMI 39.82 kg/m   Physical Exam  Constitutional: She is oriented to person, place, and time. She appears well-developed and well-nourished. No distress.  HENT:  Head: Normocephalic and atraumatic.  Right Ear: Tympanic membrane, external ear and ear canal normal.  Left Ear: Tympanic membrane, external ear and ear canal normal.  Nose: Nose normal. No mucosal edema or rhinorrhea.  Mouth/Throat: Uvula is midline. No  uvula swelling. No oropharyngeal exudate, posterior oropharyngeal edema, posterior oropharyngeal erythema or tonsillar abscesses.  No swelling of the uvula or oropharynx No erythema or evidence of burns  Eyes: Conjunctivae are normal.  Neck: Normal range of motion.  Patent airway No stridor; normal phonation Handling secretions without difficulty  Cardiovascular: Normal rate, normal heart sounds and intact distal pulses.   No murmur heard. Pulmonary/Chest: Effort normal and breath sounds normal. No stridor. No respiratory distress. She has no wheezes.  No wheezes or rhonchi  Abdominal: Soft. Bowel sounds are normal. There is no tenderness.  Musculoskeletal: Normal range of motion. She exhibits no edema.  Neurological: She is alert and oriented to person, place, and time.  Skin: Skin is warm and dry. No rash noted. She is not diaphoretic.  No Urticaria noted No excoriations - no induration or fluctuance to indicate secondary infection  Psychiatric: She has a normal mood and affect.  Nursing note and vitals reviewed.    ED Treatments / Results   Procedures Procedures (including critical care time)  Medications Ordered in ED Medications  gi cocktail (Maalox,Lidocaine,Donnatal) (30 mLs Oral Given 12/09/16 0318)     Initial Impression / Assessment and Plan / ED Course  I have reviewed the triage vital signs and the nursing notes.  Pertinent labs & imaging results that were available during my care of the patient were reviewed by me and considered in my medical decision making (see chart for details).     Patient presents with sore throat and nausea after ingestion of slushie.  Question allergic reaction versus possible contaminant. Patient is well-appearing, no stridor or swelling of the oropharynx. No evidence of chemical or heat burn to the posterior oropharynx. Patient given GI cocktail with improvement in symptoms. Recommend Benadryl usage at home. Discussed reasons to return  to the emergency department including difficulty breathing, vomiting or other concerns. Patient is understanding and is in agreement with plan.  Patient noted to be hypertensive in the emergency department.  No signs of hypertensive urgency.  Discussed with patient the need for close follow-up and management by their primary care physician.    Final Clinical Impressions(s) / ED Diagnoses   Final diagnoses:  Allergic reaction, initial encounter    New Prescriptions Discharge Medication List as of 12/09/2016  3:08 AM    START taking these medications   Details  diphenhydrAMINE (BENADRYL) 25 MG tablet Take 1 tablet (25 mg total) by mouth every 6 (six) hours as needed for itching (Rash)., Starting Tue 12/09/2016, Print  Wen Munford, PA-C 12/09/16 Boyd [id number]    Shon BatonHorton, Courtney F, MD 12/09/16 [phone]

## 2016-12-09 NOTE — Discharge Instructions (Signed)
1. Medications: Benadryl, usual home medications 2. Treatment: rest, drink plenty of fluids, take medications as prescribed 3. Follow Up: Please followup with your primary doctor in 3 days for discussion of your diagnoses and further evaluation after today's visit; if you do not have a primary care doctor use the resource guide provided to find one; followup with dermatology as needed; Return to the ER for difficulty breathing, return of allergic reaction or other concerning symptoms

## 2016-12-11 DIAGNOSIS — T7840XD Allergy, unspecified, subsequent encounter: Secondary | ICD-10-CM | POA: Diagnosis not present

## 2016-12-18 MED FILL — SYNTHROID 150 MCG TABLET: 150 | 30 days supply | Qty: 30 | Fill #5

## 2016-12-19 MED FILL — LOSARTAN POTASSIUM 50 MG TA: 50 | 90 days supply | Qty: 90 | Fill #1

## 2017-01-20 DIAGNOSIS — E7800 Pure hypercholesterolemia, unspecified: Secondary | ICD-10-CM | POA: Diagnosis not present

## 2017-01-20 DIAGNOSIS — I10 Essential (primary) hypertension: Secondary | ICD-10-CM | POA: Diagnosis not present

## 2017-01-20 DIAGNOSIS — E039 Hypothyroidism, unspecified: Secondary | ICD-10-CM | POA: Diagnosis not present

## 2017-01-23 DIAGNOSIS — I10 Essential (primary) hypertension: Secondary | ICD-10-CM | POA: Diagnosis not present

## 2017-01-23 DIAGNOSIS — E039 Hypothyroidism, unspecified: Secondary | ICD-10-CM | POA: Diagnosis not present

## 2017-01-23 DIAGNOSIS — K219 Gastro-esophageal reflux disease without esophagitis: Secondary | ICD-10-CM | POA: Diagnosis not present

## 2017-01-23 DIAGNOSIS — E7800 Pure hypercholesterolemia, unspecified: Secondary | ICD-10-CM | POA: Diagnosis not present

## 2017-02-06 DIAGNOSIS — H1011 Acute atopic conjunctivitis, right eye: Secondary | ICD-10-CM | POA: Diagnosis not present

## 2017-02-06 DIAGNOSIS — L0291 Cutaneous abscess, unspecified: Secondary | ICD-10-CM | POA: Diagnosis not present

## 2017-02-06 MED FILL — AMOX-CLAV 500-125 MG TABLET: 500-125 | 10 days supply | Qty: 20 | Fill #0

## 2017-02-17 DIAGNOSIS — M170 Bilateral primary osteoarthritis of knee: Secondary | ICD-10-CM | POA: Diagnosis not present

## 2017-02-17 DIAGNOSIS — M1712 Unilateral primary osteoarthritis, left knee: Secondary | ICD-10-CM | POA: Diagnosis not present

## 2017-02-17 DIAGNOSIS — M1711 Unilateral primary osteoarthritis, right knee: Secondary | ICD-10-CM | POA: Diagnosis not present

## 2017-02-19 MED FILL — ATORVASTATIN 20 MG TABLET: 20 | 90 days supply | Qty: 45 | Fill #2

## 2017-02-20 DIAGNOSIS — E559 Vitamin D deficiency, unspecified: Secondary | ICD-10-CM | POA: Diagnosis not present

## 2017-02-20 DIAGNOSIS — E039 Hypothyroidism, unspecified: Secondary | ICD-10-CM | POA: Diagnosis not present

## 2017-02-20 DIAGNOSIS — E7800 Pure hypercholesterolemia, unspecified: Secondary | ICD-10-CM | POA: Diagnosis not present

## 2017-02-20 DIAGNOSIS — Z0001 Encounter for general adult medical examination with abnormal findings: Secondary | ICD-10-CM | POA: Diagnosis not present

## 2017-02-24 DIAGNOSIS — Z1212 Encounter for screening for malignant neoplasm of rectum: Secondary | ICD-10-CM | POA: Diagnosis not present

## 2017-02-24 DIAGNOSIS — E039 Hypothyroidism, unspecified: Secondary | ICD-10-CM | POA: Diagnosis not present

## 2017-02-24 DIAGNOSIS — I10 Essential (primary) hypertension: Secondary | ICD-10-CM | POA: Diagnosis not present

## 2017-02-24 DIAGNOSIS — D72829 Elevated white blood cell count, unspecified: Secondary | ICD-10-CM | POA: Diagnosis not present

## 2017-02-24 DIAGNOSIS — Z0000 Encounter for general adult medical examination without abnormal findings: Secondary | ICD-10-CM | POA: Diagnosis not present

## 2017-02-24 DIAGNOSIS — E7800 Pure hypercholesterolemia, unspecified: Secondary | ICD-10-CM | POA: Diagnosis not present

## 2017-03-06 DIAGNOSIS — D72829 Elevated white blood cell count, unspecified: Secondary | ICD-10-CM | POA: Diagnosis not present

## 2017-03-20 MED FILL — LOSARTAN POTASSIUM 50 MG TA: 50 | 90 days supply | Qty: 90 | Fill #2

## 2017-04-20 MED FILL — AMOXICILLIN 500 MG CAPSULE: 500 | 7 days supply | Qty: 28 | Fill #0

## 2017-04-27 DIAGNOSIS — M5441 Lumbago with sciatica, right side: Secondary | ICD-10-CM | POA: Diagnosis not present

## 2017-04-27 DIAGNOSIS — M545 Low back pain: Secondary | ICD-10-CM | POA: Diagnosis not present

## 2017-04-27 DIAGNOSIS — Z23 Encounter for immunization: Secondary | ICD-10-CM | POA: Diagnosis not present

## 2017-04-27 MED FILL — MELOXICAM 15 MG TABLET: 15 | 30 days supply | Qty: 30 | Fill #0

## 2017-05-19 DIAGNOSIS — E7800 Pure hypercholesterolemia, unspecified: Secondary | ICD-10-CM | POA: Diagnosis not present

## 2017-05-19 DIAGNOSIS — E039 Hypothyroidism, unspecified: Secondary | ICD-10-CM | POA: Diagnosis not present

## 2017-05-19 DIAGNOSIS — I10 Essential (primary) hypertension: Secondary | ICD-10-CM | POA: Diagnosis not present

## 2017-06-05 MED FILL — ATORVASTATIN 20 MG TABLET: 20 | 90 days supply | Qty: 45 | Fill #3

## 2017-06-19 MED FILL — LOSARTAN POTASSIUM 50 MG TA: 50 | 90 days supply | Qty: 90 | Fill #0

## 2017-06-23 DIAGNOSIS — E039 Hypothyroidism, unspecified: Secondary | ICD-10-CM | POA: Diagnosis not present

## 2017-06-23 MED FILL — SERTRALINE HCL 50 MG TABLET: 50 | 30 days supply | Qty: 30 | Fill #0

## 2017-06-23 MED FILL — clonazePAM 0.5 MG TABS: 0.5 | 15 days supply | Qty: 30 | Fill #0

## 2017-07-09 DIAGNOSIS — E039 Hypothyroidism, unspecified: Secondary | ICD-10-CM | POA: Diagnosis not present

## 2017-08-24 DIAGNOSIS — E039 Hypothyroidism, unspecified: Secondary | ICD-10-CM | POA: Diagnosis not present

## 2017-08-24 DIAGNOSIS — I10 Essential (primary) hypertension: Secondary | ICD-10-CM | POA: Diagnosis not present

## 2017-08-24 DIAGNOSIS — E7800 Pure hypercholesterolemia, unspecified: Secondary | ICD-10-CM | POA: Diagnosis not present

## 2017-08-24 DIAGNOSIS — E559 Vitamin D deficiency, unspecified: Secondary | ICD-10-CM | POA: Diagnosis not present

## 2017-08-24 MED FILL — AMOXICILLIN-POT CLAVULANATE: 875-125 | 10 days supply | Qty: 20 | Fill #0

## 2017-08-26 DIAGNOSIS — R7989 Other specified abnormal findings of blood chemistry: Secondary | ICD-10-CM | POA: Diagnosis not present

## 2017-08-26 DIAGNOSIS — E039 Hypothyroidism, unspecified: Secondary | ICD-10-CM | POA: Diagnosis not present

## 2017-08-26 DIAGNOSIS — E7800 Pure hypercholesterolemia, unspecified: Secondary | ICD-10-CM | POA: Diagnosis not present

## 2017-08-26 MED FILL — ATORVASTATIN 20 MG TABLET: 20 | 90 days supply | Qty: 45 | Fill #0

## 2017-08-28 MED FILL — SYNTHROID 175 MCG TABLET: 175 | 30 days supply | Qty: 22 | Fill #0

## 2017-08-28 MED FILL — SYNTHROID 150 MCG TABLET: 150 | 30 days supply | Qty: 9 | Fill #0

## 2017-09-10 DIAGNOSIS — E039 Hypothyroidism, unspecified: Secondary | ICD-10-CM | POA: Diagnosis not present

## 2017-09-10 DIAGNOSIS — I10 Essential (primary) hypertension: Secondary | ICD-10-CM | POA: Diagnosis not present

## 2017-09-10 DIAGNOSIS — E7800 Pure hypercholesterolemia, unspecified: Secondary | ICD-10-CM | POA: Diagnosis not present

## 2017-09-10 DIAGNOSIS — E559 Vitamin D deficiency, unspecified: Secondary | ICD-10-CM | POA: Diagnosis not present

## 2017-09-10 DIAGNOSIS — K219 Gastro-esophageal reflux disease without esophagitis: Secondary | ICD-10-CM | POA: Diagnosis not present

## 2017-09-15 DIAGNOSIS — Z8719 Personal history of other diseases of the digestive system: Secondary | ICD-10-CM | POA: Diagnosis not present

## 2017-09-15 DIAGNOSIS — R1030 Lower abdominal pain, unspecified: Secondary | ICD-10-CM | POA: Diagnosis not present

## 2017-09-15 DIAGNOSIS — N390 Urinary tract infection, site not specified: Secondary | ICD-10-CM | POA: Diagnosis not present

## 2017-09-15 DIAGNOSIS — Z1212 Encounter for screening for malignant neoplasm of rectum: Secondary | ICD-10-CM | POA: Diagnosis not present

## 2017-09-15 DIAGNOSIS — Z01419 Encounter for gynecological examination (general) (routine) without abnormal findings: Secondary | ICD-10-CM | POA: Diagnosis not present

## 2017-09-18 MED FILL — LOSARTAN POTASSIUM 50 MG TA: 50 | 90 days supply | Qty: 90 | Fill #1

## 2017-10-19 MED FILL — SYNTHROID 150 MCG TABLET: 150 | 30 days supply | Qty: 9 | Fill #1

## 2017-10-19 MED FILL — SYNTHROID 175 MCG TABLET: 175 | 30 days supply | Qty: 22 | Fill #1

## 2017-11-06 IMAGING — CT CT ABD-PELV W/ CM
3 of 5 series · 13 of 36 positions shown, 19 images · IV contrast (READICAT/WATER & [ID] ISOVUE 300)
Comparison: 08/15/2014.

CLINICAL DATA: Right upper quadrant and pelvic pain for several
months. Diarrhea and constipation.

EXAM:
CT ABDOMEN AND PELVIS WITH CONTRAST
TECHNIQUE: Multidetector CT imaging of the abdomen and pelvis was performed
using the standard protocol following bolus administration of
intravenous contrast.
CONTRAST:  125mL ARVA3X-DBB IOPAMIDOL (ARVA3X-DBB) INJECTION 61%

[Series 3: abd/pelvis with · axial · 0.82mm/px · z∈[-344,-48]mm · 6 of 83 slices shown, 11 images]
[im 12/83  soft-tissue]
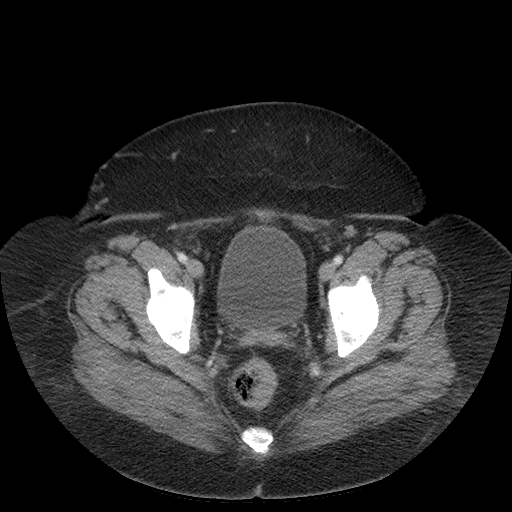
[im 12/83  bone]
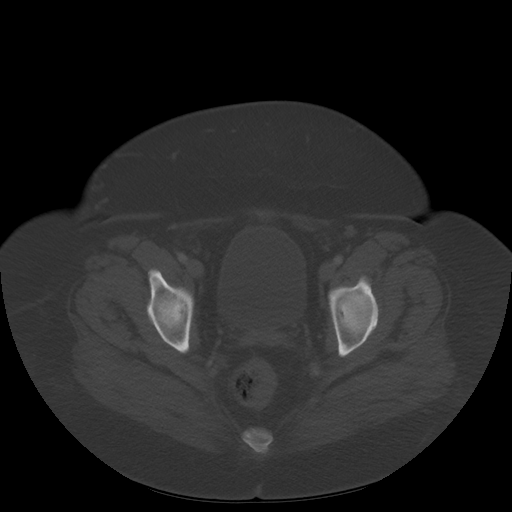
[im 24/83  soft-tissue]
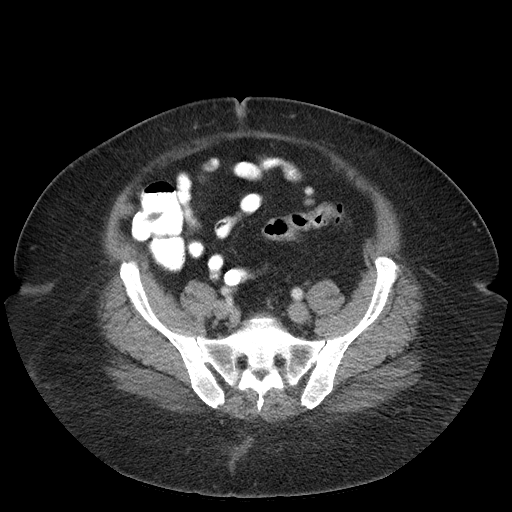
[im 36/83  soft-tissue]
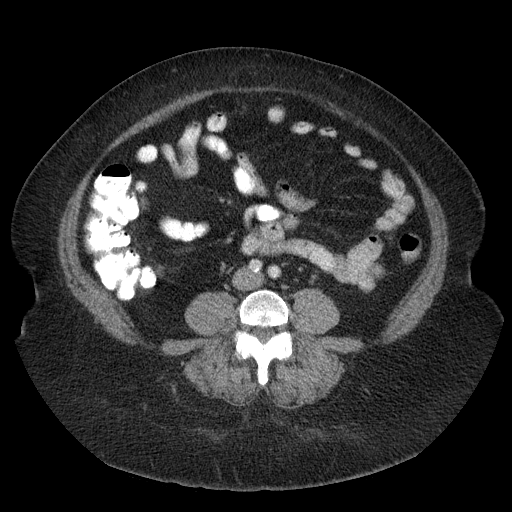
[im 36/83  lung]
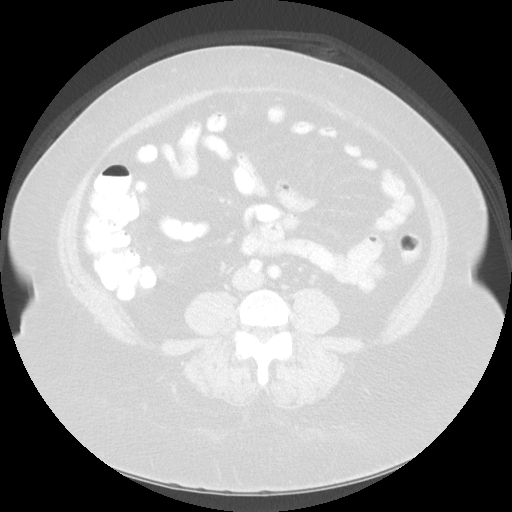
[im 47/83  soft-tissue]
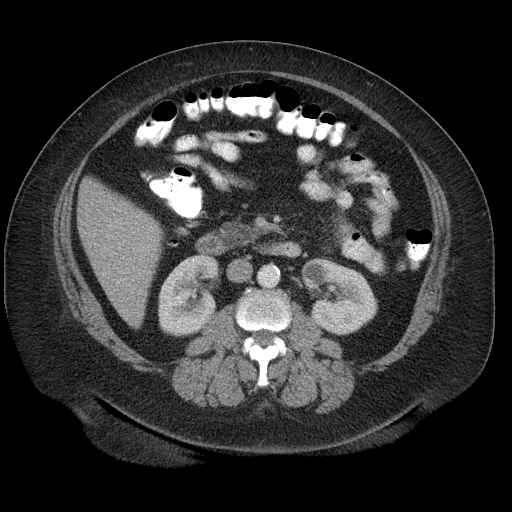
[im 47/83  lung]
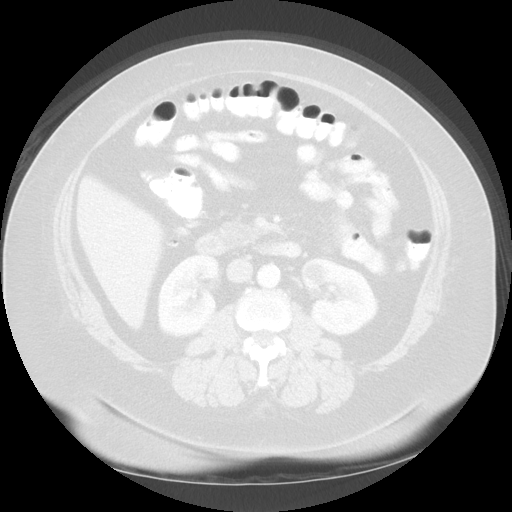
[im 59/83  soft-tissue]
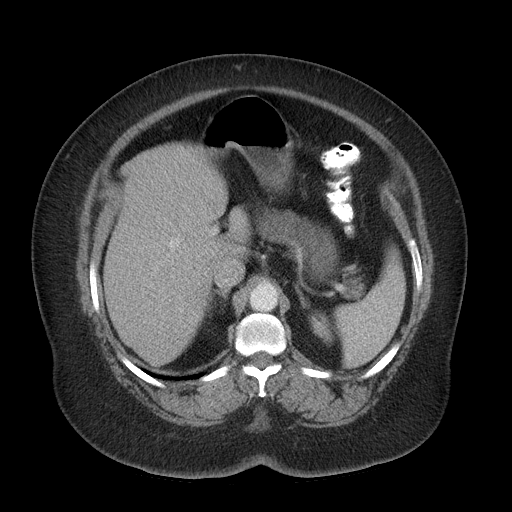
[im 59/83  lung]
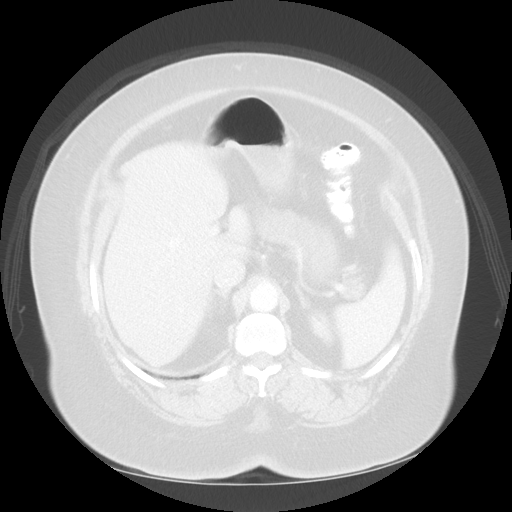
[im 71/83  soft-tissue]
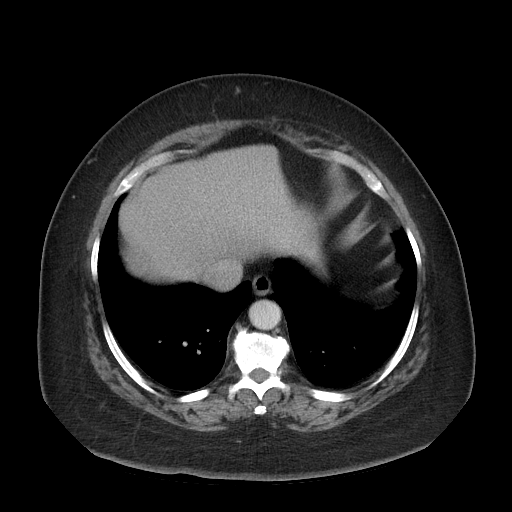
[im 71/83  lung]
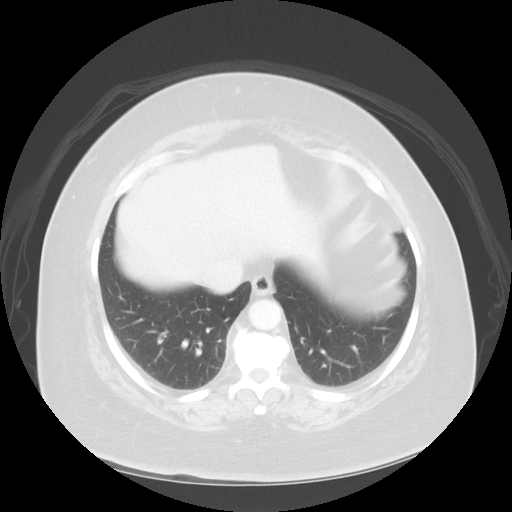

[Series 601: coronal body · coronal · 0.87mm/px · 1 of 154 slices shown, 2 images]
[im 52/154  soft-tissue]
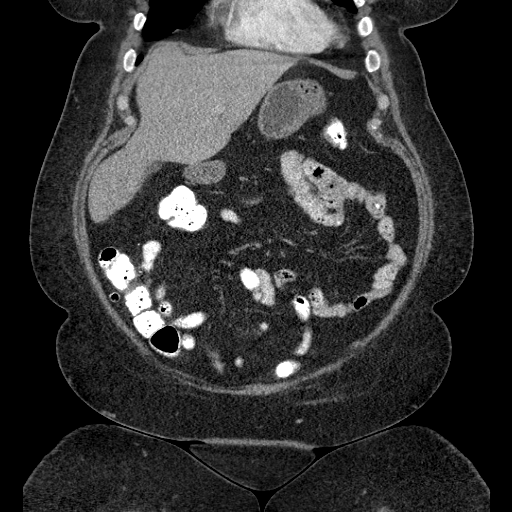
[im 52/154  bone]
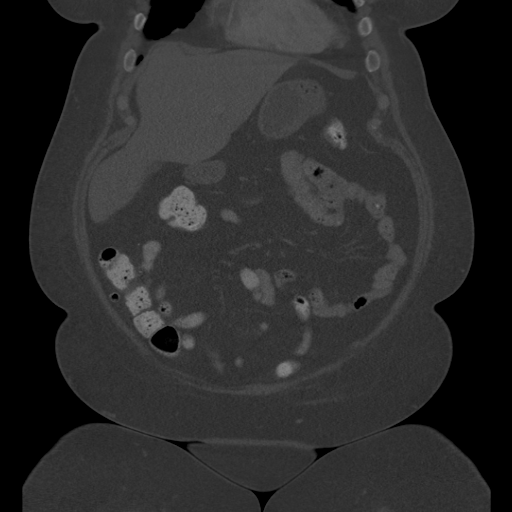

[Series 602: sagittal body · sagittal · 0.87mm/px · 6 of 169 slices shown]
[im 20/169  soft-tissue]
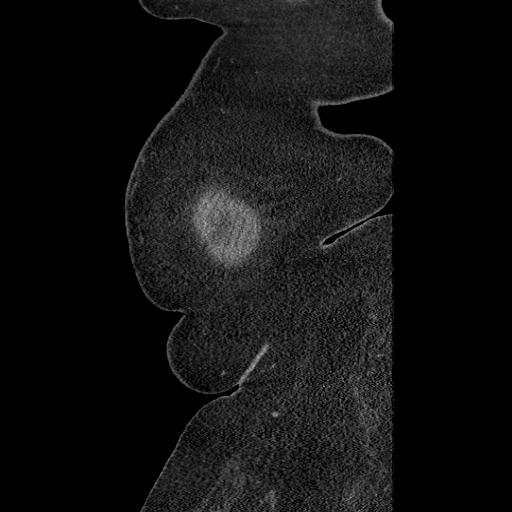
[im 40/169  soft-tissue]
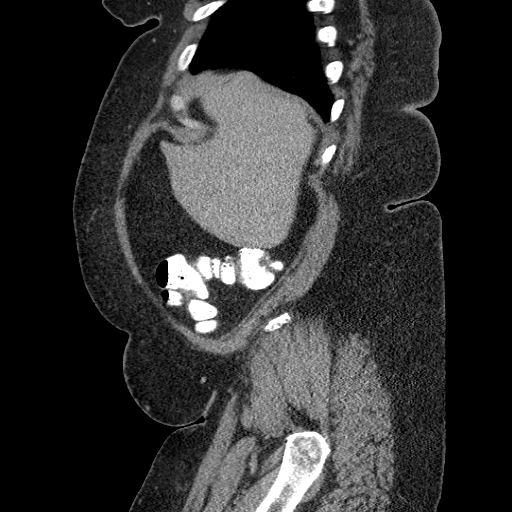
[im 60/169  soft-tissue]
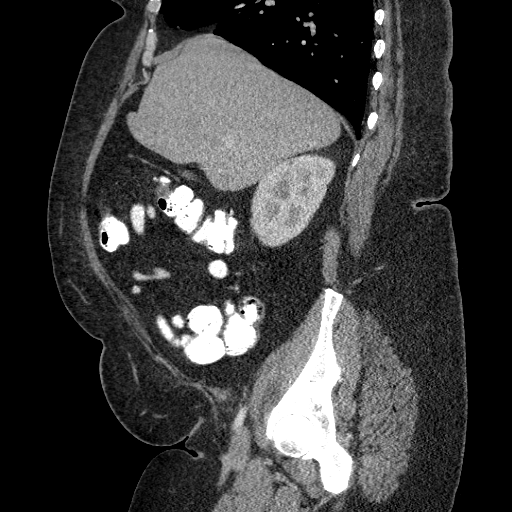
[im 80/169  soft-tissue]
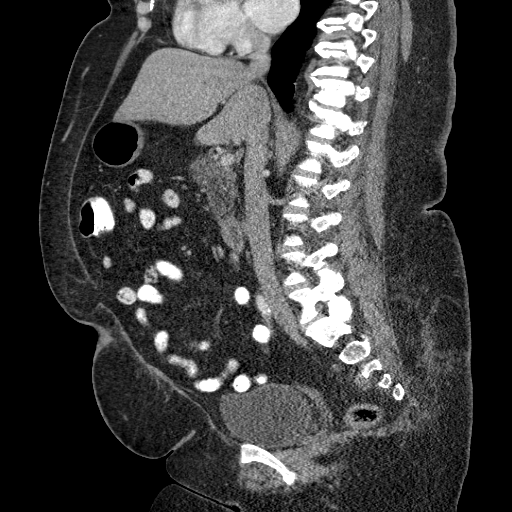
[im 99/169  soft-tissue]
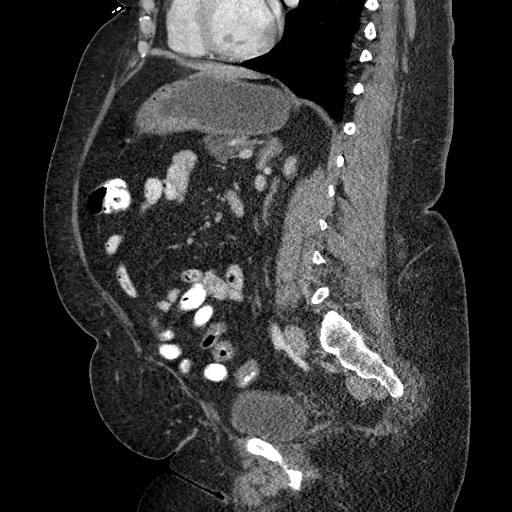
[im 119/169  soft-tissue]
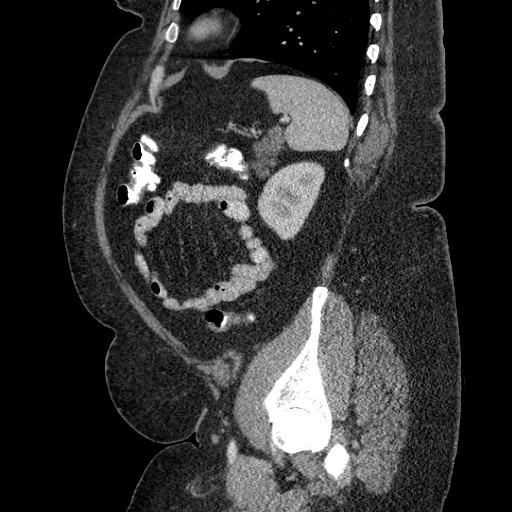

[13 of 36 positions shown; findings below may reference images not displayed]

FINDINGS: Lower chest: Lung bases show no acute findings. Heart is at the
upper limits of normal in size. No pericardial or pleural effusion.

Hepatobiliary: Liver is unremarkable. Cholecystectomy. No biliary
ductal dilatation.

Pancreas: Negative.

Spleen: Negative.

Adrenals/Urinary Tract: Adrenal glands are unremarkable.
Low-attenuation lesions in the kidneys measure up to 1.7 cm on the
left, likely stable when remeasured. Statistically, cysts are most
likely. Ureters are decompressed. Bladder is grossly unremarkable.

Stomach/Bowel: Stomach, small bowel and colon are unremarkable.
Appendectomy.

Vascular/Lymphatic: Atherosclerotic calcification of the arterial
vasculature without abdominal aortic aneurysm. No pathologically
enlarged lymph nodes.

Reproductive: Hysterectomy.  Ovaries are not well-visualized.

Other: A small supraumbilical midline ventral hernia contains fat.
Small left para midline ventral pelvic wall hernia contains fat
(3/64). Probable tiny bilateral inguinal hernias, also
fat-containing. No free fluid. Mesenteries and peritoneum are
unremarkable.

Musculoskeletal: No worrisome lytic or sclerotic lesions.
IMPRESSION: 1. No findings to explain the patient's symptoms.
2. Scattered fat-containing ventral, and likely inguinal, hernias.

## 2017-11-17 MED FILL — SYNTHROID 175 MCG TABLET: 175 | 30 days supply | Qty: 22 | Fill #2

## 2017-11-17 MED FILL — SYNTHROID 150 MCG TABLET: 150 | 30 days supply | Qty: 9 | Fill #2

## 2017-11-27 ENCOUNTER — Encounter (HOSPITAL_COMMUNITY): Payer: Self-pay | Admitting: Emergency Medicine

## 2017-11-27 ENCOUNTER — Ambulatory Visit (HOSPITAL_COMMUNITY)
Admission: EM | Admit: 2017-11-27 | Discharge: 2017-11-27 | Disposition: A | Discharge status: Home / Self Care | Payer: Medicare Other | Attending: Family Medicine | Admitting: Family Medicine

## 2017-11-27 DIAGNOSIS — S60222A Contusion of left hand, initial encounter: Secondary | ICD-10-CM

## 2017-11-27 DIAGNOSIS — M79672 Pain in left foot: Secondary | ICD-10-CM

## 2017-11-27 MED ORDER — IBUPROFEN 800 MG PO TABS: 800.0000 mg | ORAL_TABLET | Freq: Three times a day (TID) | ORAL | 0 refills | Status: DC

## 2017-11-27 NOTE — ED Triage Notes (Signed)
Pt states at 4pm today a rack fell and hit her L hand and L foot. Pt had shoes on, no obvious injury to L hand.

## 2017-11-27 NOTE — Discharge Instructions (Signed)
Use ice and elevation for the hand pain.  Wear the brace as needed for comfort. Take ibuprofen as needed for pain.  Take with food. Limit walking while foot is painful. Expect improvement over a few days.

## 2017-11-27 NOTE — ED Provider Notes (Addendum)
[facility]    CSN: [id number] Arrival date & time: 11/27/17  1919     History   Chief Complaint Chief Complaint  Patient presents with  . Hand Pain  . Foot Pain    HPI Nina Pitts is a 57 y.o. female.   HPI  Patient was shopping at [employer] [place].  She was reaching to a rack above her head.  The rack fell on to her and hit her on the hand and the foot, left.  She filed a report at the store.  They gave her an ice pack.  They told her to follow-up if she had persistent problems.  Her left hand is still swollen and somewhat painful.  Her left foot is still swollen and somewhat painful.  Full range of motion.  Full use.  Normal gait.  No prior hand or foot problems.  Past Medical History:  Diagnosis Date  . Anxiety   . Arthritis    "joints" (02/03/2013)  . Carpal tunnel syndrome   . Fatty liver disease, nonalcoholic dx'd "~ 09/979"  . Fibromyalgia   . Gallstones   . GERD (gastroesophageal reflux disease)   . Gout attack ~ 2011  . Hypertension   . Hypothyroidism   . IBS (irritable bowel syndrome)   . Obesity   . Osteoarthritis of both knees   . PTSD (post-traumatic stress disorder)     Patient Active Problem List   Diagnosis Date Noted  . Incarcerated ventral hernia 10/17/2015  . Aortic atherosclerosis (HCC) 03/21/2015  . Fever 02/08/2013  . Abdominal pain 02/08/2013  . IBS (irritable bowel syndrome) 11/17/2012  . Unspecified hypothyroidism 11/17/2012  . Obesity, unspecified 11/17/2012    Past Surgical History:  Procedure Laterality Date  . ABDOMINAL EXPLORATION SURGERY  04/27/2001   w/LOA/notes 04/27/2001 (02/03/2013)  . ABDOMINAL HYSTERECTOMY  ~ 2000   partial  . APPENDECTOMY  ~ 1974  . AXILLARY HIDRADENITIS EXCISION Bilateral 1990's   with stsg  . BILATERAL SALPINGOOPHORECTOMY Bilateral 04/27/2001   [place] Perch 04/27/2001 (02/03/2013)  . CARDIAC CATHETERIZATION  ~ 2005  . CESAREAN SECTION  1979; 1980; 1986  . CHOLECYSTECTOMY  02/03/2013  .  CHOLECYSTECTOMY N/A 02/03/2013   Procedure: LAPAROSCOPIC CHOLECYSTECTOMY WITH INTRAOPERATIVE CHOLANGIOGRAM;  Surgeon: [name], MD;  Location: [facility] OR;  Service: General;  Laterality: N/A;  . ERCP N/A 02/09/2013   Procedure: ENDOSCOPIC RETROGRADE CHOLANGIOPANCREATOGRAPHY (ERCP) with Stent Placement;  Surgeon: [name], MD;  Location: [facility] OR;  Service: Endoscopy;  Laterality: N/A;  . ESOPHAGOGASTRODUODENOSCOPY N/A 03/28/2013   Procedure: ESOPHAGOGASTRODUODENOSCOPY (EGD);  Surgeon: [name], MD;  Location: [facility];  Service: Endoscopy;  Laterality: N/A;  . HERNIA REPAIR  ~ 1974   [facility]   . INCISIONAL HERNIA REPAIR  10/17/2015  . INCISIONAL HERNIA REPAIR N/A 10/17/2015   Procedure: LAPAROSCOPIC INCISIONAL HERNIA WITH MESH ;  Surgeon: [name], MD;  Location: [facility] OR;  Service: General;  Laterality: N/A;  . INSERTION OF MESH N/A 10/17/2015   Procedure: INSERTION OF MESH;  Surgeon: [name], MD;  Location: [facility] OR;  Service: General;  Laterality: N/A;  . KNEE ARTHROSCOPY Right 1990'S  . PILONIDAL CYST EXCISION  2000?    OB History   None      Home Medications    Prior to Admission medications   Medication Sig Start Date End Date Taking? Authorizing Provider  aspirin 81 MG chewable tablet Chew 81 mg by mouth daily.    [provider]  atorvastatin (LIPITOR) 20  MG tablet Take 20 mg by mouth daily.  02/16/13   [provider]  ciprofloxacin (CILOXAN) 0.3 % ophthalmic solution 1-2 drops in affected eye 4 times/day x 5 days 12/24/15   Domenick Gong, MD  diphenhydrAMINE (BENADRYL) 25 MG tablet Take 1 tablet (25 mg total) by mouth every 6 (six) hours as needed for itching (Rash). 12/09/16   Muthersbaugh, Dahlia Client, PA-C  docusate sodium (COLACE) 100 MG capsule Take 1 capsule (100 mg total) by mouth 2 (two) times daily. 10/18/15   [name], MD  ibuprofen (ADVIL,MOTRIN) 800 MG tablet Take 1 tablet (800 mg total) by mouth 3 (three) times daily. 12/24/15    Domenick Gong, MD  ibuprofen (ADVIL,MOTRIN) 800 MG tablet Take 1 tablet (800 mg total) by mouth 3 (three) times daily. 11/27/17   Eustace Moore, MD  levothyroxine (SYNTHROID, LEVOTHROID) 150 MCG tablet Take 150 mcg by mouth daily before breakfast.    [provider]  levothyroxine (SYNTHROID, LEVOTHROID) 175 MCG tablet Take 175 mcg by mouth daily before breakfast.    [provider]  losartan (COZAAR) 50 MG tablet Take 50 mg by mouth daily.    [provider]  metaxalone (SKELAXIN) 800 MG tablet Take 800 mg by mouth 3 (three) times daily as needed for pain.     [provider]  Multiple Vitamin (MULTIVITAMIN) tablet Take 1 tablet by mouth daily.    [provider]  naproxen (NAPROSYN) 500 MG tablet Take 1 tablet (500 mg total) by mouth 2 (two) times daily. 02/04/16   Felicie Morn, NP  olopatadine (PATANOL) 0.1 % ophthalmic solution Place 1 drop into both eyes 2 (two) times daily. 12/24/15   Domenick Gong, MD  oxyCODONE (OXY IR/ROXICODONE) 5 MG immediate release tablet Take 1-2 tablets (5-10 mg total) by mouth every 4 (four) hours as needed for moderate pain. 10/18/15   [name], MD  pantoprazole (PROTONIX) 40 MG tablet Take 40 mg by mouth daily.    [provider]  promethazine (PHENERGAN) 12.5 MG tablet Take 1 tablet (12.5 mg total) by mouth every 6 (six) hours as needed for nausea or vomiting. 10/18/15   [name], MD  tiZANidine (ZANAFLEX) 4 MG tablet Take 4 mg by mouth every 6 (six) hours as needed for muscle spasms.    [provider]  Vitamin D, Ergocalciferol, (DRISDOL) 50000 units CAPS capsule Take 50,000 Units by mouth every 7 (seven) days.    [provider]    Family History Family History  Problem Relation Age of Onset  . Pancreatic cancer Father   . Esophageal cancer Father   . Cancer Father        esophagus, lungs  . Diabetes Mother   . Heart disease Mother   . Cancer Paternal  Grandmother        breast    Social History Social History   Tobacco Use  . Smoking status: Former Smoker    Packs/day: 0.50    Years: 15.00    Pack years: 7.50    Types: Cigarettes    Last attempt to quit: 11/18/1998    Years since quitting: 19.0  . Smokeless tobacco: Never Used  Substance Use Topics  . Alcohol use: No  . Drug use: No     Allergies   Percocet [oxycodone-acetaminophen]; Aspirin; Robaxin [methocarbamol]; Vicodin [hydrocodone-acetaminophen]; and Tramadol   Review of Systems Review of Systems  Constitutional: Negative for chills and fever.  HENT: Negative for ear pain and sore throat.  Eyes: Negative for pain and visual disturbance.  Respiratory: Negative for cough and shortness of breath.   Cardiovascular: Negative for chest pain and palpitations.  Gastrointestinal: Negative for abdominal pain and vomiting.  Genitourinary: Negative for dysuria and hematuria.  Musculoskeletal: Negative for arthralgias and back pain.       Left hand pain, left foot pain  Skin: Negative for color change and rash.  Neurological: Negative for seizures and syncope.  All other systems reviewed and are negative.    Physical Exam Triage Vital Signs ED Triage Vitals [11/27/17 1934]  Enc Vitals Group     BP (!) 137/52     Pulse Rate 71     Resp 16     Temp 98.5 F (36.9 C)     Temp src      SpO2 98 %     Weight      Height      Head Circumference      Peak Flow      Pain Score      Pain Loc      Pain Edu?      Excl. in GC?    No data found.  Updated Vital Signs BP (!) 137/52   Pulse 71   Temp 98.5 F (36.9 C)   Resp 16   SpO2 98%   Visual Acuity Right Eye Distance:   Left Eye Distance:   Bilateral Distance:    Right Eye Near:   Left Eye Near:    Bilateral Near:     Physical Exam  Constitutional: She appears well-developed and well-nourished. No distress.  HENT:  Head: Normocephalic and atraumatic.  Mouth/Throat: Oropharynx is clear and moist.    Eyes: Pupils are equal, round, and reactive to light. Conjunctivae are normal.  Neck: Normal range of motion.  Cardiovascular: Normal rate.  Pulmonary/Chest: Effort normal. No respiratory distress.  Abdominal: Soft. She exhibits no distension.  Musculoskeletal: Normal range of motion. She exhibits no edema.  Left hand is examined.  No ecchymosis.  Slight soft tissue swelling across the dorsum.  No bony tenderness.  Good grip strength.  Full range of motion.   lEft foot is examined.  No abnormality seen.  No tenderness to palpation.  No bruising.  No deformity.  Normal gait  Neurological: She is alert.  Skin: Skin is warm and dry.     UC Treatments / Results  Labs (all labs ordered are listed, but only abnormal results are displayed) Labs Reviewed - No data to display  EKG None  Radiology No results found.  Procedures Procedures (including critical care time)  Medications Ordered in UC Medications - No data to display  Initial Impression / Assessment and Plan / UC Course  I have reviewed the triage vital signs and the nursing notes.  Pertinent labs & imaging results that were available during my care of the patient were reviewed by me and considered in my medical decision making (see chart for details).     Discussed that she has minor contusions to her head and foot.  These will heal without consequence.  No x-rays are indicated. Final Clinical Impressions(s) / UC Diagnoses   Final diagnoses:  Contusion of left hand, initial encounter  Foot pain, left     Discharge Instructions     Use ice and elevation for the hand pain.  Wear the brace as needed for comfort. Take ibuprofen as needed for pain.  Take with food. Limit walking while foot is painful. Expect  improvement over a few days.    ED Prescriptions    Medication Sig Dispense Auth. Provider   ibuprofen (ADVIL,MOTRIN) 800 MG tablet Take 1 tablet (800 mg total) by mouth 3 (three) times daily. 21 tablet  Eustace Moore, MD     Controlled Substance Prescriptions [place] Controlled Substance Registry consulted? Not Applicable   Eustace Moore, MD 11/27/17 2006    Eustace Moore, MD 11/27/17 2008

## 2017-11-30 MED FILL — ATORVASTATIN 20 MG TABLET: 20 | 90 days supply | Qty: 45 | Fill #1

## 2017-12-02 DIAGNOSIS — M79642 Pain in left hand: Secondary | ICD-10-CM | POA: Diagnosis not present

## 2017-12-02 DIAGNOSIS — M79672 Pain in left foot: Secondary | ICD-10-CM | POA: Diagnosis not present

## 2017-12-15 MED FILL — LOSARTAN POTASSIUM 50 MG TA: 50 | 90 days supply | Qty: 90 | Fill #2

## 2018-01-20 DIAGNOSIS — E039 Hypothyroidism, unspecified: Secondary | ICD-10-CM | POA: Diagnosis not present

## 2018-01-20 MED FILL — tiZANidine HCL 4 MG TABS: 4 | 30 days supply | Qty: 90 | Fill #0

## 2018-01-21 MED FILL — clonazePAM 0.5 MG TABS: 0.5 | 30 days supply | Qty: 60 | Fill #0

## 2018-02-19 MED FILL — SYNTHROID 175 MCG TABLET: 175 | 30 days supply | Qty: 22 | Fill #3

## 2018-02-19 MED FILL — SYNTHROID 150 MCG TABLET: 150 | 30 days supply | Qty: 9 | Fill #3

## 2018-03-01 DIAGNOSIS — E039 Hypothyroidism, unspecified: Secondary | ICD-10-CM | POA: Diagnosis not present

## 2018-03-01 DIAGNOSIS — Z1321 Encounter for screening for nutritional disorder: Secondary | ICD-10-CM | POA: Diagnosis not present

## 2018-03-01 DIAGNOSIS — Z0001 Encounter for general adult medical examination with abnormal findings: Secondary | ICD-10-CM | POA: Diagnosis not present

## 2018-03-01 DIAGNOSIS — I10 Essential (primary) hypertension: Secondary | ICD-10-CM | POA: Diagnosis not present

## 2018-03-04 DIAGNOSIS — R3121 Asymptomatic microscopic hematuria: Secondary | ICD-10-CM | POA: Diagnosis not present

## 2018-03-04 DIAGNOSIS — N390 Urinary tract infection, site not specified: Secondary | ICD-10-CM | POA: Diagnosis not present

## 2018-03-04 DIAGNOSIS — E039 Hypothyroidism, unspecified: Secondary | ICD-10-CM | POA: Diagnosis not present

## 2018-03-04 DIAGNOSIS — Z0000 Encounter for general adult medical examination without abnormal findings: Secondary | ICD-10-CM | POA: Diagnosis not present

## 2018-03-04 DIAGNOSIS — E7800 Pure hypercholesterolemia, unspecified: Secondary | ICD-10-CM | POA: Diagnosis not present

## 2018-03-04 DIAGNOSIS — Z23 Encounter for immunization: Secondary | ICD-10-CM | POA: Diagnosis not present

## 2018-03-10 MED FILL — ATORVASTATIN CALCIUM 20 MG: 20 | 90 days supply | Qty: 45 | Fill #2

## 2018-03-19 MED FILL — LOSARTAN POTASSIUM 50 MG TA: 50 | 90 days supply | Qty: 90 | Fill #0

## 2018-03-23 DIAGNOSIS — M1711 Unilateral primary osteoarthritis, right knee: Secondary | ICD-10-CM | POA: Insufficient documentation

## 2018-03-23 DIAGNOSIS — M25562 Pain in left knee: Secondary | ICD-10-CM | POA: Diagnosis not present

## 2018-03-23 DIAGNOSIS — M1712 Unilateral primary osteoarthritis, left knee: Secondary | ICD-10-CM | POA: Insufficient documentation

## 2018-03-23 DIAGNOSIS — M179 Osteoarthritis of knee, unspecified: Secondary | ICD-10-CM | POA: Insufficient documentation

## 2018-03-23 DIAGNOSIS — M25561 Pain in right knee: Secondary | ICD-10-CM | POA: Diagnosis not present

## 2018-04-08 MED FILL — CEPHALEXIN 500 MG CAPSULE: 500 | 10 days supply | Qty: 20 | Fill #0

## 2018-04-15 MED FILL — SYNTHROID 175 MCG TABLET: 175 | 84 days supply | Qty: 60 | Fill #0

## 2018-05-12 ENCOUNTER — Encounter (HOSPITAL_COMMUNITY): Payer: Self-pay

## 2018-05-12 ENCOUNTER — Ambulatory Visit (INDEPENDENT_AMBULATORY_CARE_PROVIDER_SITE_OTHER): Payer: Medicare Other

## 2018-05-12 ENCOUNTER — Ambulatory Visit (HOSPITAL_COMMUNITY)
Admission: EM | Admit: 2018-05-12 | Discharge: 2018-05-12 | Disposition: A | Discharge status: Home / Self Care | Payer: Medicare Other | Attending: Family Medicine | Admitting: Family Medicine

## 2018-05-12 DIAGNOSIS — M542 Cervicalgia: Secondary | ICD-10-CM | POA: Diagnosis not present

## 2018-05-12 DIAGNOSIS — S60221A Contusion of right hand, initial encounter: Secondary | ICD-10-CM | POA: Diagnosis not present

## 2018-05-12 DIAGNOSIS — S0990XA Unspecified injury of head, initial encounter: Secondary | ICD-10-CM | POA: Diagnosis not present

## 2018-05-12 DIAGNOSIS — I10 Essential (primary) hypertension: Secondary | ICD-10-CM | POA: Diagnosis not present

## 2018-05-12 DIAGNOSIS — S6991XA Unspecified injury of right wrist, hand and finger(s), initial encounter: Secondary | ICD-10-CM | POA: Diagnosis not present

## 2018-05-12 DIAGNOSIS — W228XXA Striking against or struck by other objects, initial encounter: Secondary | ICD-10-CM

## 2018-05-12 DIAGNOSIS — S199XXA Unspecified injury of neck, initial encounter: Secondary | ICD-10-CM | POA: Diagnosis not present

## 2018-05-12 DIAGNOSIS — S161XXA Strain of muscle, fascia and tendon at neck level, initial encounter: Secondary | ICD-10-CM

## 2018-05-12 DIAGNOSIS — M79641 Pain in right hand: Secondary | ICD-10-CM | POA: Diagnosis not present

## 2018-05-12 DIAGNOSIS — W19XXXA Unspecified fall, initial encounter: Secondary | ICD-10-CM | POA: Diagnosis not present

## 2018-05-12 MED ORDER — IBUPROFEN 800 MG PO TABS: 800.0000 mg | ORAL_TABLET | Freq: Three times a day (TID) | ORAL | 0 refills | Status: AC

## 2018-05-12 NOTE — ED Triage Notes (Signed)
Pt stepped in a whole in the ground and fall. Pt has a bruise on the left side of her head from the fall, her head hit the concrete

## 2018-05-14 MED FILL — IBUPROFEN 800 MG TAB: 800 | 10 days supply | Qty: 30 | Fill #0

## 2018-05-14 NOTE — ED Provider Notes (Signed)
[facility]   [id number] 05/12/18 Arrival Time: 1649  ASSESSMENT & PLAN:  1. Minor head injury without loss of consciousness, initial encounter   2. Contusion of right hand, initial encounter   3. Acute strain of neck muscle, initial encounter    No indication for head imaging at this time. Discussed. Head injury precautions given.  I have personally viewed the imaging studies ordered this visit. No fractures or dislocations seen.  Imaging: Dg Cervical Spine Complete Result Date: 05/12/2018 CLINICAL DATA:  Fall with cervical pain EXAM: CERVICAL SPINE - COMPLETE 4+ VIEW COMPARISON:  None. FINDINGS: Straightening of the cervical spine. Suboptimal visualization of cervicothoracic junction. Vertebral body heights and prevertebral soft tissue thickness are normal. Mild degenerative changes C4 through C7. Dens and lateral masses are unremarkable. IMPRESSION: Straightening of the cervical spine with suboptimal evaluation of the cervicothoracic junction. Degenerative changes. Electronically Signed   By: Jasmine Pang M.D.   On: 05/12/2018 18:37   Dg Hand Complete Right Result Date: 05/12/2018 CLINICAL DATA:  Larey Seat in hole hand pain EXAM: RIGHT HAND - COMPLETE 3+ VIEW COMPARISON:  04/19/2015 FINDINGS: There is no evidence of fracture or dislocation. There is no evidence of arthropathy or other focal bone abnormality. Soft tissues are unremarkable. IMPRESSION: Negative. Electronically Signed   By: Jasmine Pang M.D.   On: 05/12/2018 18:36   Meds ordered this encounter  Medications  . ibuprofen (ADVIL,MOTRIN) 800 MG tablet    Sig: Take 1 tablet (800 mg total) by mouth 3 (three) times daily.    Dispense:  30 tablet    Refill:  0   Follow-up Information    [facility].   Specialty:  Urgent Care Why:  If symptoms worsen. Contact information: [address] [place] [address] [phone]         Rest the injured area as much  as practical. NSAID as above.  Reviewed expectations re: course of current medical issues. Questions answered. Outlined signs and symptoms indicating need for more acute intervention. Patient verbalized understanding. After Visit Summary given.  SUBJECTIVE: History from: patient. Nina Pitts is a 57 y.o. female who reports fairly persistent mild to moderate pain of her right hand after a fall onto her hand last evening; also reports mild neck pain associated; "kind of stretched my neck back"; hand pain described as aching without radiation; neck pain described as "sore" without radiation.She did hit her head on the concrete; EMS called and she was evaluated by paramedics; no LOC. No n/v/mental status changes since hitting her head. No visual or hearing changes. Ambulatory without difficulty. Symptoms have progressed to a point and plateaued since beginning. Neck a little better today. Discomfort relieved by: rest. Discomfort worsened by: certain movements. Associated symptoms: none reported. Extremity sensation changes or weakness: none. Self treatment: has not tried OTCs for relief of pain. History of similar: no.  Past Surgical History:  Procedure Laterality Date  . ABDOMINAL EXPLORATION SURGERY  04/27/2001   w/LOA/notes 04/27/2001 (02/03/2013)  . ABDOMINAL HYSTERECTOMY  ~ 2000   partial  . APPENDECTOMY  ~ 1974  . AXILLARY HIDRADENITIS EXCISION Bilateral 1990's   with stsg  . BILATERAL SALPINGOOPHORECTOMY Bilateral 04/27/2001   Destiny Perch 04/27/2001 (02/03/2013)  . CARDIAC CATHETERIZATION  ~ 2005  . CESAREAN SECTION  1979; 1980; 1986  . CHOLECYSTECTOMY  02/03/2013  . CHOLECYSTECTOMY N/A 02/03/2013   Procedure: LAPAROSCOPIC CHOLECYSTECTOMY WITH INTRAOPERATIVE CHOLANGIOGRAM;  Surgeon: [name], MD;  Location: [facility] OR;  Service: General;  Laterality: N/A;  . ERCP N/A 02/09/2013   Procedure: ENDOSCOPIC RETROGRADE CHOLANGIOPANCREATOGRAPHY (ERCP) with Stent Placement;  Surgeon:  Petra KubaMarc E Magod, MD;  [facility];  Service: Endoscopy;  Laterality: N/A;  . ESOPHAGOGASTRODUODENOSCOPY N/A 03/28/2013   Procedure: ESOPHAGOGASTRODUODENOSCOPY (EGD);  Surgeon: Petra KubaMarc E Magod, MD;  [facility];  Service: Endoscopy;  Laterality: N/A;  . [facility]PA[facility]  ~ [facility]   . INCISIONAL HERNIA REPAIR  10/17/2015  . INCISIONAL HERNIA REPAIR N/A 10/17/2015   Procedure: LAPAROSCOPIC INCISIONAL HERNIA WITH MESH ;  Surgeon: Emelia LoronMatthew Wakefield, MD;  [facility];  Service: General;  Laterality: N/A;  . INSERTION OF MESH N/A 10/17/2015   Procedure: INSERTION OF MESH;  Surgeon: Emelia LoronMatthew Wakefield, MD;  [facility];  Service: General;  Laterality: N/A;  . KNEE ARTHROSCOPY Right 1990'S  . PILONIDAL CYST EXCISION  2000?     ROS: As per HPI. All other systems negative.    OBJECTIVE:  Vitals:   05/12/18 1713  BP: (!) 144/53  Pulse: 60  Resp: 18  Temp: 98 F (36.7 C)  TempSrc: Oral  SpO2: 99%    General appearance: alert; no distress HEENT: small abrasion just above L temple on forehead; no hematoma; no bleeding; PERRLA; EOMI; TMs normal Neck: supple with FROM; she reports poorly localized tenderness over mid cervical spine and also over muscles of neck Lungs: CTAB; unlabored CV: RRR Extremities: . RUE: warm and well perfused; poorly localized moderate tenderness over right proximal palm at wrist; without gross deformities; with no swelling; with no bruising; ROM: normal at wrist but with reported discomfort; normal grip Abd: soft; non-tender CV: brisk extremity capillary refill of RUE; 2+ radial pulse of RUE. Skin: warm and dry; no visible rashes Neurologic: gait normal; normal reflexes of RUE, LUE, RLE and LLE; normal sensation of RUE, LUE, RLE and LLE; normal strength of RUE, LUE, RLE and LLE Psychological: alert and cooperative; normal mood and affect  Allergies  Allergen Reactions  . Percocet [Oxycodone-Acetaminophen] Nausea And Vomiting  . Aspirin Nausea And  Vomiting    Adult strength only. Patient stated that she can take lower doses of aspirin  . Robaxin [Methocarbamol] Itching  . Vicodin [Hydrocodone-Acetaminophen] Itching  . Tramadol Other (See Comments)    Has bad headaches after taking    Past Medical History:  Diagnosis Date  . Anxiety   . Arthritis    "joints" (02/03/2013)  . Carpal tunnel syndrome   . Fatty liver disease, nonalcoholic dx'd "~ 9/56384/2014"  . Fibromyalgia   . Gallstones   . GERD (gastroesophageal reflux disease)   . Gout attack ~ 2011  . Hypertension   . Hypothyroidism   . IBS (irritable bowel syndrome)   . Obesity   . Osteoarthritis of both knees   . PTSD (post-traumatic stress disorder)    Social History   Socioeconomic History  . Marital status: Married    Spouse name: Not on file  . Number of children: 3  . Years of education: Not on file  . Highest education level: Not on file  Occupational History  . Occupation: 3  Social Needs  . Financial resource strain: Not on file  . Food insecurity:    Worry: Not on file    Inability: Not on file  . Transportation needs:    Medical: Not on file    Non-medical: Not on file  Tobacco Use  . Smoking status: Former Smoker    Packs/day: 0.50  Years: 15.00    Pack years: 7.50    Types: Cigarettes    Last attempt to quit: 11/18/1998    Years since quitting: 19.4  . Smokeless tobacco: Never Used  Substance and Sexual Activity  . Alcohol use: No  . Drug use: No  . Sexual activity: Not Currently  Lifestyle  . Physical activity:    Days per week: Not on file    Minutes per session: Not on file  . Stress: Not on file  Relationships  . Social connections:    Talks on phone: Not on file    Gets together: Not on file    Attends religious service: Not on file    Active member of club or organization: Not on file    Attends meetings of clubs or organizations: Not on file    Relationship status: Not on file  Other Topics Concern  . Not on file  Social  History Narrative  . Not on file   Family History  Problem Relation Age of Onset  . Pancreatic cancer Father   . Esophageal cancer Father   . Cancer Father        esophagus, lungs  . Diabetes Mother   . Heart disease Mother   . Cancer Paternal Grandmother        breast   Past Surgical History:  Procedure Laterality Date  . ABDOMINAL EXPLORATION SURGERY  04/27/2001   w/LOA/notes 04/27/2001 (02/03/2013)  . ABDOMINAL HYSTERECTOMY  ~ 2000   partial  . APPENDECTOMY  ~ 1974  . AXILLARY HIDRADENITIS EXCISION Bilateral 1990's   with stsg  . BILATERAL SALPINGOOPHORECTOMY Bilateral 04/27/2001   Skylah Perch 04/27/2001 (02/03/2013)  . CARDIAC CATHETERIZATION  ~ 2005  . CESAREAN SECTION  1979; 1980; 1986  . CHOLECYSTECTOMY  02/03/2013  . CHOLECYSTECTOMY N/A 02/03/2013   Procedure: LAPAROSCOPIC CHOLECYSTECTOMY WITH INTRAOPERATIVE CHOLANGIOGRAM;  Surgeon: [name], MD;  Location: [facility] OR;  Service: General;  Laterality: N/A;  . ERCP N/A 02/09/2013   Procedure: ENDOSCOPIC RETROGRADE CHOLANGIOPANCREATOGRAPHY (ERCP) with Stent Placement;  Surgeon: [name], MD;  [facility];  Service: Endoscopy;  Laterality: N/A;  . ESOPHAGOGASTRODUODENOSCOPY N/A 03/28/2013   Procedure: ESOPHAGOGASTRODUODENOSCOPY (EGD);  Surgeon: [name], MD;  [facility];  Service: Endoscopy;  Laterality: N/A;  . HERNIA REPAIR  ~ 1974   [facility]   . INCISIONAL HERNIA REPAIR  10/17/2015  . INCISIONAL HERNIA REPAIR N/A 10/17/2015   Procedure: LAPAROSCOPIC INCISIONAL HERNIA WITH MESH ;  Surgeon: [name], MD;  Location[facility] OR;  Service: General;  Laterality: N/A;  . INSERTION OF MESH N/A 10/17/2015   Procedure: INSERTION OF MESH;  Surgeon: [name], MD;  Location: [facility] OR;  Service: General;  Laterality: N/A;  . KNEE ARTHROSCOPY Right 1990'S  . PILONIDAL CYST EXCISION  2000?      Nina Layman, MD 05/14/18 [phone]

## 2018-05-19 ENCOUNTER — Other Ambulatory Visit: Payer: Self-pay | Admitting: Internal Medicine

## 2018-05-19 DIAGNOSIS — S0512XD Contusion of eyeball and orbital tissues, left eye, subsequent encounter: Secondary | ICD-10-CM | POA: Diagnosis not present

## 2018-05-19 DIAGNOSIS — S0990XD Unspecified injury of head, subsequent encounter: Secondary | ICD-10-CM | POA: Diagnosis not present

## 2018-05-19 DIAGNOSIS — Z9181 History of falling: Secondary | ICD-10-CM | POA: Diagnosis not present

## 2018-05-25 ENCOUNTER — Ambulatory Visit
Admission: RE | Admit: 2018-05-25 | Discharge: 2018-05-25 | Disposition: A | Discharge status: Home / Self Care | Payer: Medicare Other | Source: Ambulatory Visit | Attending: Internal Medicine | Admitting: Internal Medicine

## 2018-05-25 DIAGNOSIS — S0512XD Contusion of eyeball and orbital tissues, left eye, subsequent encounter: Secondary | ICD-10-CM

## 2018-05-25 DIAGNOSIS — S0990XD Unspecified injury of head, subsequent encounter: Secondary | ICD-10-CM

## 2018-05-25 DIAGNOSIS — S0990XA Unspecified injury of head, initial encounter: Secondary | ICD-10-CM | POA: Diagnosis not present

## 2018-06-08 MED FILL — ATORVASTATIN CALCIUM 20 MG: 20 | 90 days supply | Qty: 45 | Fill #3

## 2018-06-25 MED FILL — LOSARTAN POTASSIUM 50 MG TA: 50 | 90 days supply | Qty: 90 | Fill #1

## 2018-06-28 DIAGNOSIS — S0592XD Unspecified injury of left eye and orbit, subsequent encounter: Secondary | ICD-10-CM | POA: Diagnosis not present

## 2018-06-30 ENCOUNTER — Other Ambulatory Visit: Payer: Self-pay | Admitting: Internal Medicine

## 2018-06-30 DIAGNOSIS — S0592XD Unspecified injury of left eye and orbit, subsequent encounter: Secondary | ICD-10-CM

## 2018-07-01 DIAGNOSIS — H40013 Open angle with borderline findings, low risk, bilateral: Secondary | ICD-10-CM | POA: Diagnosis not present

## 2018-07-01 DIAGNOSIS — H43392 Other vitreous opacities, left eye: Secondary | ICD-10-CM | POA: Diagnosis not present

## 2018-07-01 DIAGNOSIS — H04123 Dry eye syndrome of bilateral lacrimal glands: Secondary | ICD-10-CM | POA: Diagnosis not present

## 2018-07-01 DIAGNOSIS — H2513 Age-related nuclear cataract, bilateral: Secondary | ICD-10-CM | POA: Diagnosis not present

## 2018-07-01 DIAGNOSIS — H5213 Myopia, bilateral: Secondary | ICD-10-CM | POA: Diagnosis not present

## 2018-07-05 ENCOUNTER — Ambulatory Visit
Admission: RE | Admit: 2018-07-05 | Discharge: 2018-07-05 | Disposition: A | Discharge status: Home / Self Care | Payer: Medicare Other | Source: Ambulatory Visit | Attending: Internal Medicine | Admitting: Internal Medicine

## 2018-07-05 DIAGNOSIS — S0592XD Unspecified injury of left eye and orbit, subsequent encounter: Secondary | ICD-10-CM

## 2018-07-05 DIAGNOSIS — S0012XD Contusion of left eyelid and periocular area, subsequent encounter: Secondary | ICD-10-CM | POA: Diagnosis not present

## 2018-09-02 DIAGNOSIS — I10 Essential (primary) hypertension: Secondary | ICD-10-CM | POA: Diagnosis not present

## 2018-09-02 DIAGNOSIS — E7800 Pure hypercholesterolemia, unspecified: Secondary | ICD-10-CM | POA: Diagnosis not present

## 2018-09-02 DIAGNOSIS — E039 Hypothyroidism, unspecified: Secondary | ICD-10-CM | POA: Diagnosis not present

## 2018-09-03 DIAGNOSIS — Z0000 Encounter for general adult medical examination without abnormal findings: Secondary | ICD-10-CM | POA: Diagnosis not present

## 2018-09-04 MED FILL — LOSARTAN POTASSIUM 50 MG TA: 50 | 90 days supply | Qty: 90 | Fill #2

## 2018-09-06 MED FILL — ATORVASTATIN 20 MG TABLET: 20 | 90 days supply | Qty: 45 | Fill #0

## 2018-09-23 MED FILL — SYNTHROID 175 MCG TABLET: 175 | 28 days supply | Qty: 20 | Fill #0

## 2018-09-29 DIAGNOSIS — E039 Hypothyroidism, unspecified: Secondary | ICD-10-CM | POA: Diagnosis not present

## 2018-10-05 MED FILL — SYNTHROID 150 MCG TABLET: 150 | 28 days supply | Qty: 8 | Fill #0

## 2018-10-27 MED FILL — SYNTHROID 175 MCG TABLET: 175 | 28 days supply | Qty: 20 | Fill #0

## 2018-10-28 DIAGNOSIS — N390 Urinary tract infection, site not specified: Secondary | ICD-10-CM | POA: Diagnosis not present

## 2018-10-28 MED FILL — CEPHALEXIN 500 MG CAPSULE: 500 | 5 days supply | Qty: 10 | Fill #0

## 2018-11-29 MED FILL — SYNTHROID 175 MCG TABLET: 175 | 28 days supply | Qty: 20 | Fill #0

## 2018-11-29 MED FILL — SYNTHROID 150 MCG TABLET: 150 | 28 days supply | Qty: 8 | Fill #0

## 2018-12-22 MED FILL — ATORVASTATIN 20 MG TABLET: 20 | 90 days supply | Qty: 45 | Fill #0

## 2018-12-22 MED FILL — LOSARTAN POTASSIUM 50 MG TA: 50 | 90 days supply | Qty: 90 | Fill #3

## 2018-12-30 MED FILL — SYNTHROID 150 MCG TABLET: 150 | 28 days supply | Qty: 8 | Fill #1

## 2018-12-30 MED FILL — SYNTHROID 175 MCG TABLET: 175 | 28 days supply | Qty: 20 | Fill #1

## 2019-01-24 MED FILL — SYNTHROID 150 MCG TABLET: 150 | 28 days supply | Qty: 8 | Fill #2

## 2019-01-24 MED FILL — SYNTHROID 175 MCG TABLET: 175 | 28 days supply | Qty: 20 | Fill #2

## 2019-02-14 DIAGNOSIS — J029 Acute pharyngitis, unspecified: Secondary | ICD-10-CM | POA: Diagnosis not present

## 2019-02-16 MED FILL — clonazePAM 0.5 MG TABS: 0.5 | 30 days supply | Qty: 60 | Fill #0

## 2019-02-18 MED FILL — CEPHALEXIN 500 MG CAPSULE: 500 | 7 days supply | Qty: 14 | Fill #0

## 2019-02-28 DIAGNOSIS — J309 Allergic rhinitis, unspecified: Secondary | ICD-10-CM | POA: Diagnosis not present

## 2019-03-07 MED FILL — SYNTHROID 175 MCG TABLET: 175 | 28 days supply | Qty: 20 | Fill #3

## 2019-03-21 MED FILL — LOSARTAN POTASSIUM 50 MG TA: 50 | 90 days supply | Qty: 90 | Fill #0

## 2019-03-21 MED FILL — ATORVASTATIN 20 MG TABLET: 20 | 90 days supply | Qty: 45 | Fill #1

## 2019-04-04 MED FILL — SYNTHROID 175 MCG TABLET: 175 | 28 days supply | Qty: 20 | Fill #4

## 2019-04-08 DIAGNOSIS — K219 Gastro-esophageal reflux disease without esophagitis: Secondary | ICD-10-CM | POA: Diagnosis not present

## 2019-04-08 MED FILL — OMEPRAZOLE DR 40 MG CAPSULE: 40 | 30 days supply | Qty: 30 | Fill #0

## 2019-04-18 MED FILL — SYNTHROID 150 MCG TABLET: 150 | 28 days supply | Qty: 8 | Fill #3

## 2019-04-20 DIAGNOSIS — K219 Gastro-esophageal reflux disease without esophagitis: Secondary | ICD-10-CM | POA: Diagnosis not present

## 2019-04-20 DIAGNOSIS — Z0000 Encounter for general adult medical examination without abnormal findings: Secondary | ICD-10-CM | POA: Diagnosis not present

## 2019-04-20 DIAGNOSIS — E559 Vitamin D deficiency, unspecified: Secondary | ICD-10-CM | POA: Diagnosis not present

## 2019-04-20 DIAGNOSIS — E039 Hypothyroidism, unspecified: Secondary | ICD-10-CM | POA: Diagnosis not present

## 2019-04-20 DIAGNOSIS — I10 Essential (primary) hypertension: Secondary | ICD-10-CM | POA: Diagnosis not present

## 2019-04-20 DIAGNOSIS — Z23 Encounter for immunization: Secondary | ICD-10-CM | POA: Diagnosis not present

## 2019-04-21 DIAGNOSIS — Z0000 Encounter for general adult medical examination without abnormal findings: Secondary | ICD-10-CM | POA: Diagnosis not present

## 2019-04-21 DIAGNOSIS — E7800 Pure hypercholesterolemia, unspecified: Secondary | ICD-10-CM | POA: Diagnosis not present

## 2019-04-21 DIAGNOSIS — E559 Vitamin D deficiency, unspecified: Secondary | ICD-10-CM | POA: Diagnosis not present

## 2019-04-21 DIAGNOSIS — E039 Hypothyroidism, unspecified: Secondary | ICD-10-CM | POA: Diagnosis not present

## 2019-05-11 MED FILL — SYNTHROID 175 MCG TABLET: 175 | 28 days supply | Qty: 20 | Fill #5

## 2019-06-01 DIAGNOSIS — R1311 Dysphagia, oral phase: Secondary | ICD-10-CM | POA: Diagnosis not present

## 2019-06-01 MED FILL — PANTOPRAZOLE SOD DR 40 MG T: 40 | 30 days supply | Qty: 30 | Fill #0

## 2019-06-11 ENCOUNTER — Ambulatory Visit
Admission: EM | Admit: 2019-06-11 | Discharge: 2019-06-11 | Disposition: A | Discharge status: Home / Self Care | Payer: Medicare Other | Attending: Emergency Medicine | Admitting: Emergency Medicine

## 2019-06-11 ENCOUNTER — Encounter: Payer: Self-pay | Admitting: Emergency Medicine

## 2019-06-11 ENCOUNTER — Other Ambulatory Visit: Payer: Self-pay

## 2019-06-11 DIAGNOSIS — H5789 Other specified disorders of eye and adnexa: Secondary | ICD-10-CM | POA: Diagnosis not present

## 2019-06-11 DIAGNOSIS — W208XXA Other cause of strike by thrown, projected or falling object, initial encounter: Secondary | ICD-10-CM | POA: Diagnosis not present

## 2019-06-11 DIAGNOSIS — M79672 Pain in left foot: Secondary | ICD-10-CM | POA: Diagnosis not present

## 2019-06-11 NOTE — Discharge Instructions (Addendum)
Recommend RICE: rest, ice, compression, elevation as needed for pain.   Cold therapy (ice packs) can be used to help swelling both after injury and after prolonged use of areas of chronic pain/aches.  For pain: recommend 350 mg-1000 mg of Tylenol (acetaminophen) and/or 200 mg - 800 mg of Advil (ibuprofen, Motrin) every 8 hours as needed.  May alternate between the two throughout the day as they are generally safe to take together.  DO NOT exceed more than 3000 mg of Tylenol or 3200 mg of ibuprofen in a 24 hour period as this could damage your stomach, kidneys, liver, or increase your bleeding risk.  Return if persistent, worsening pain, swelling, difficulty walking as you may need an x-ray at that time.  For your eye, you may apply lubricant eyedrops as needed for symptom relief: Do not use use more than 4 times daily.

## 2019-06-11 NOTE — ED Provider Notes (Signed)
[name]    CSN: [id number] Arrival date & time: 06/11/19  0803      History   Chief Complaint Chief Complaint  Patient presents with  . Foot Pain  . Conjunctivitis    HPI Nina Pitts is a 58 y.o. female history hypertension, obesity presenting for multiple concerns: States left eye has had daily irritation, redness, foreign body sensation for the last week.  States this is intermittent and denies known foreign body exposure.  Patient denies change in vision, purulent discharge, fever, eye pain, painful eye movements.  Has not tried anything for this. Patient also noting left foot pain after a shelf fell from approximately 1-2 feet above ground level onto the distal aspect of her foot (over 4th & 5th toes).  Patient denies numbness, tingling, abnormal gait, open wound.  States she has not tried anything for her left foot pain.  Past Medical History:  Diagnosis Date  . Anxiety   . Arthritis    "joints" (02/03/2013)  . Carpal tunnel syndrome   . Fatty liver disease, nonalcoholic dx'd "~ 4/09814/2014"  . Fibromyalgia   . Gallstones   . GERD (gastroesophageal reflux disease)   . Gout attack ~ 2011  . Hypertension   . Hypothyroidism   . IBS (irritable bowel syndrome)   . Obesity   . Osteoarthritis of both knees   . PTSD (post-traumatic stress disorder)     Patient Active Problem List   Diagnosis Date Noted  . Incarcerated ventral hernia 10/17/2015  . Aortic atherosclerosis (HCC) 03/21/2015  . Fever 02/08/2013  . Abdominal pain 02/08/2013  . IBS (irritable bowel syndrome) 11/17/2012  . Unspecified hypothyroidism 11/17/2012  . Obesity, unspecified 11/17/2012    Past Surgical History:  Procedure Laterality Date  . ABDOMINAL EXPLORATION SURGERY  04/27/2001   w/LOA/notes 04/27/2001 (02/03/2013)  . ABDOMINAL HYSTERECTOMY  ~ 2000   partial  . APPENDECTOMY  ~ 1974  . AXILLARY HIDRADENITIS EXCISION Bilateral 1990's   with stsg  . BILATERAL  SALPINGOOPHORECTOMY Bilateral 04/27/2001   Laxmi Perch/notes 04/27/2001 (02/03/2013)  . CARDIAC CATHETERIZATION  ~ 2005  . CESAREAN SECTION  1979; 1980; 1986  . CHOLECYSTECTOMY  02/03/2013  . CHOLECYSTECTOMY N/A 02/03/2013   Procedure: LAPAROSCOPIC CHOLECYSTECTOMY WITH INTRAOPERATIVE CHOLANGIOGRAM;  Surgeon: Emelia LoronMatthew Wakefield, MD;  [facility];  Service: General;  Laterality: N/A;  . ERCP N/A 02/09/2013   Procedure: ENDOSCOPIC RETROGRADE CHOLANGIOPANCREATOGRAPHY (ERCP) with Stent Placement;  Surgeon: Petra KubaMarc E Magod, MD;  [facility];  Service: Endoscopy;  Laterality: N/A;  . ESOPHAGOGASTRODUODENOSCOPY N/A 03/28/2013   Procedure: ESOPHAGOGASTRODUODENOSCOPY (EGD);  Surgeon: Petra KubaMarc E Magod, MD;  [facility];  Service: Endoscopy;  Laterality: N/A;  . HERNIA REPAIR  ~ [facility]   [facility]   . INCISIONAL HERNIA REPAIR  10/17/2015  . INCISIONAL HERNIA REPAIR N/A 10/17/2015   Procedure: LAPAROSCOPIC INCISIONAL HERNIA WITH MESH ;  Surgeon: Emelia LoronMatthew Wakefield, MD;  [facility];  Service: General;  Laterality: N/A;  . INSERTION OF MESH N/A 10/17/2015   Procedure: INSERTION OF MESH;  Surgeon: Emelia LoronMatthew Wakefield, MD;  [facility];  Service: General;  Laterality: N/A;  . KNEE ARTHROSCOPY Right 1990'S  . PILONIDAL CYST EXCISION  2000?    OB History   No obstetric history on file.      Home Medications    Prior to Admission medications   Medication Sig Start Date End Date Taking? Authorizing Provider  aspirin 81 MG chewable tablet Chew 81 mg by mouth daily.  [provider]  atorvastatin (LIPITOR) 20 MG tablet Take 20 mg by mouth daily.  02/16/13   [provider]  diphenhydrAMINE (BENADRYL) 25 MG tablet Take 1 tablet (25 mg total) by mouth every 6 (six) hours as needed for itching (Rash). 12/09/16   Muthersbaugh, Dahlia Client, PA-C  docusate sodium (COLACE) 100 MG capsule Take 1 capsule (100 mg total) by mouth 2 (two) times daily. 10/18/15   [name], MD  ibuprofen (ADVIL,MOTRIN) 800  MG tablet Take 1 tablet (800 mg total) by mouth 3 (three) times daily. 05/12/18   Mardella Layman, MD  levothyroxine (SYNTHROID, LEVOTHROID) 150 MCG tablet Take 150 mcg by mouth daily before breakfast.    [provider]  levothyroxine (SYNTHROID, LEVOTHROID) 175 MCG tablet Take 175 mcg by mouth daily before breakfast.    [provider]  losartan (COZAAR) 50 MG tablet Take 50 mg by mouth daily.    [provider]  metaxalone (SKELAXIN) 800 MG tablet Take 800 mg by mouth 3 (three) times daily as needed for pain.     [provider]  Multiple Vitamin (MULTIVITAMIN) tablet Take 1 tablet by mouth daily.    [provider]  naproxen (NAPROSYN) 500 MG tablet Take 1 tablet (500 mg total) by mouth 2 (two) times daily. 02/04/16   Felicie Morn, NP  olopatadine (PATANOL) 0.1 % ophthalmic solution Place 1 drop into both eyes 2 (two) times daily. 12/24/15   Domenick Gong, MD  pantoprazole (PROTONIX) 40 MG tablet Take 40 mg by mouth daily.    [provider]  promethazine (PHENERGAN) 12.5 MG tablet Take 1 tablet (12.5 mg total) by mouth every 6 (six) hours as needed for nausea or vomiting. 10/18/15   [name], MD  tiZANidine (ZANAFLEX) 4 MG tablet Take 4 mg by mouth every 6 (six) hours as needed for muscle spasms.    [provider]  Vitamin D, Ergocalciferol, (DRISDOL) 50000 units CAPS capsule Take 50,000 Units by mouth every 7 (seven) days.    [provider]    Family History Family History  Problem Relation Age of Onset  . Pancreatic cancer Father   . Esophageal cancer Father   . Cancer Father        esophagus, lungs  . Diabetes Mother   . Heart disease Mother   . Cancer Paternal Grandmother        breast    Social History Social History   Tobacco Use  . Smoking status: Former Smoker    Packs/day: 0.50    Years: 15.00    Pack years: 7.50    Types: Cigarettes    Quit date: 11/18/1998    Years since quitting: 20.5   . Smokeless tobacco: Never Used  Substance Use Topics  . Alcohol use: No  . Drug use: No     Allergies   Percocet [oxycodone-acetaminophen], Aspirin, Robaxin [methocarbamol], Vicodin [hydrocodone-acetaminophen], and Tramadol   Review of Systems Review of Systems  Constitutional: Negative for fatigue and fever.  HENT: Negative for ear pain, sinus pain, sore throat and voice change.   Eyes: Positive for redness and itching. Negative for photophobia, pain, discharge and visual disturbance.  Respiratory: Negative for cough and shortness of breath.   Cardiovascular: Negative for chest pain and palpitations.  Gastrointestinal: Negative for abdominal pain, diarrhea and vomiting.  Musculoskeletal: Negative for arthralgias and myalgias.       Positive for left foot pain  Skin: Negative for rash and wound.  Neurological: Negative for syncope  and headaches.     Physical Exam Triage Vital Signs ED Triage Vitals  Enc Vitals Group     BP 06/11/19 0810 135/82     Pulse Rate 06/11/19 0810 66     Resp 06/11/19 0810 18     Temp 06/11/19 0810 97.8 F (36.6 C)     Temp Source 06/11/19 0810 Temporal     SpO2 06/11/19 0810 95 %     Weight --      Height --      Head Circumference --      Peak Flow --      Pain Score 06/11/19 0813 4     Pain Loc --      Pain Edu? --      Excl. in [place]? --    No data found.  Updated Vital Signs BP 135/82   Pulse 66   Temp 97.8 F (36.6 C) (Temporal)   Resp 18   SpO2 95%   Visual Acuity Right Eye Distance:   Left Eye Distance:   Bilateral Distance:    Right Eye Near:   Left Eye Near:    Bilateral Near:     Physical Exam Constitutional:      General: She is not in acute distress. HENT:     Head: Normocephalic and atraumatic.     Mouth/Throat:     Mouth: Mucous membranes are moist.     Pharynx: Oropharynx is clear.  Eyes:     General: No scleral icterus.       Right eye: No discharge.        Left eye: No discharge.     Extraocular  Movements: Extraocular movements intact.     Conjunctiva/sclera: Conjunctivae normal.     Pupils: Pupils are equal, round, and reactive to light.     Comments: Periorbital area without edema or TTP.  No warmth, rash.  No foreign body identified with lid eversion bilaterally.  Cardiovascular:     Rate and Rhythm: Normal rate.  Pulmonary:     Effort: Pulmonary effort is normal.  Musculoskeletal:        General: Tenderness present. No swelling or deformity. Normal range of motion.     Right lower leg: No edema.     Left lower leg: No edema.     Comments: Mild TTP in fourth and fifth toes without swelling, MTP tenderness.  Patient does have some pain over proximal dorsal aspect of foot and mild edema, the states this is not where injury occurred.  No medial or lateral malleoli tenderness.  Full active ROM of toes, ankle.  Skin:    Capillary Refill: Capillary refill takes less than 2 seconds.     Coloration: Skin is not jaundiced or pale.     Findings: No bruising or rash.  Neurological:     General: No focal deficit present.     Mental Status: She is alert and oriented to person, place, and time.     Sensory: No sensory deficit.     Motor: No weakness.     Gait: Gait normal.     Deep Tendon Reflexes: Reflexes normal.      UC Treatments / Results  Labs (all labs ordered are listed, but only abnormal results are displayed) Labs Reviewed - No data to display  EKG   Radiology No results found.  Procedures Procedures (including critical care time)  Medications Ordered in UC Medications - No data to display  Initial Impression / Assessment and  Plan / UC Course  I have reviewed the triage vital signs and the nursing notes.  Pertinent labs & imaging results that were available during my care of the patient were reviewed by me and considered in my medical decision making (see chart for details).     Exam largely unremarkable: Provided reassurance to patient satisfaction.  Will  continue supportive management as outlined below.  Return precautions discussed, patient verbalized understanding and is agreeable to plan. Final Clinical Impressions(s) / UC Diagnoses   Final diagnoses:  Irritation of left eye  Left foot pain     Discharge Instructions     Recommend RICE: rest, ice, compression, elevation as needed for pain.   Cold therapy (ice packs) can be used to help swelling both after injury and after prolonged use of areas of chronic pain/aches.  For pain: recommend 350 mg-1000 mg of Tylenol (acetaminophen) and/or 200 mg - 800 mg of Advil (ibuprofen, Motrin) every 8 hours as needed.  May alternate between the two throughout the day as they are generally safe to take together.  DO NOT exceed more than 3000 mg of Tylenol or 3200 mg of ibuprofen in a 24 hour period as this could damage your stomach, kidneys, liver, or increase your bleeding risk.  Return if persistent, worsening pain, swelling, difficulty walking as you may need an x-ray at that time.  For your eye, you may apply lubricant eyedrops as needed for symptom relief: Do not use use more than 4 times daily.     ED Prescriptions    None     PDMP not reviewed this encounter.   [place], [place], [place] 06/12/19 1750

## 2019-06-11 NOTE — ED Triage Notes (Signed)
Pt presents to [facility] for assessment of left eye redness with a "feeling like something is in it" x 1 week.  States all symptoms are intermittent in nature, this RN does not note redness at this time, patient states it's mostly when she wakes up.  Pt also presents to [facility] for assessment of left foot pain after a shelf holding books fell down on to her foot from about knee height.

## 2019-06-15 MED FILL — SYNTHROID 150 MCG TABLET: 150 | 28 days supply | Qty: 8 | Fill #4

## 2019-06-15 MED FILL — SYNTHROID 175 MCG TABLET: 175 | 28 days supply | Qty: 20 | Fill #0

## 2019-06-23 MED FILL — LOSARTAN POTASSIUM 50 MG TA: 50 | 90 days supply | Qty: 90 | Fill #1

## 2019-06-23 MED FILL — ATORVASTATIN 20 MG TABLET: 20 | 90 days supply | Qty: 45 | Fill #2

## 2019-07-07 MED FILL — SYNTHROID 175 MCG TABLET: 175 | 28 days supply | Qty: 20 | Fill #1

## 2019-08-17 DIAGNOSIS — E039 Hypothyroidism, unspecified: Secondary | ICD-10-CM | POA: Diagnosis not present

## 2019-08-17 DIAGNOSIS — I10 Essential (primary) hypertension: Secondary | ICD-10-CM | POA: Diagnosis not present

## 2019-08-17 DIAGNOSIS — E559 Vitamin D deficiency, unspecified: Secondary | ICD-10-CM | POA: Diagnosis not present

## 2019-08-23 MED FILL — SYNTHROID 150 MCG TABLET: 150 | 28 days supply | Qty: 8 | Fill #5

## 2019-08-23 MED FILL — SYNTHROID 175 MCG TABLET: 175 | 28 days supply | Qty: 20 | Fill #2

## 2019-09-22 ENCOUNTER — Other Ambulatory Visit (HOSPITAL_COMMUNITY): Payer: Self-pay | Admitting: Internal Medicine

## 2019-09-22 MED FILL — LOSARTAN POTASSIUM 50 MG TA: 50 | 90 days supply | Qty: 90 | Fill #2

## 2019-09-22 MED FILL — SYNTHROID 175 MCG TABLET: 175 | 28 days supply | Qty: 20 | Fill #3

## 2019-09-22 MED FILL — ATORVASTATIN 20 MG TABLET: 20 | 90 days supply | Qty: 45 | Fill #0

## 2019-09-22 MED FILL — SYNTHROID 150 MCG TABLET: 150 | 28 days supply | Qty: 8 | Fill #6

## 2019-10-04 ENCOUNTER — Other Ambulatory Visit: Payer: Self-pay | Admitting: Internal Medicine

## 2019-10-04 DIAGNOSIS — Z1231 Encounter for screening mammogram for malignant neoplasm of breast: Secondary | ICD-10-CM

## 2019-11-02 ENCOUNTER — Other Ambulatory Visit (HOSPITAL_COMMUNITY): Payer: Self-pay | Admitting: Internal Medicine

## 2019-11-02 MED FILL — SYNTHROID 150 MCG TABLET: 150 | 28 days supply | Qty: 8 | Fill #0

## 2019-11-02 MED FILL — SYNTHROID 175 MCG TABLET: 175 | 28 days supply | Qty: 20 | Fill #4

## 2019-11-05 ENCOUNTER — Encounter: Payer: Self-pay | Admitting: Emergency Medicine

## 2019-11-05 ENCOUNTER — Ambulatory Visit
Admission: EM | Admit: 2019-11-05 | Discharge: 2019-11-05 | Disposition: A | Discharge status: Home / Self Care | Payer: Medicare Other

## 2019-11-05 ENCOUNTER — Other Ambulatory Visit: Payer: Self-pay

## 2019-11-05 DIAGNOSIS — T23151A Burn of first degree of right palm, initial encounter: Secondary | ICD-10-CM | POA: Diagnosis not present

## 2019-11-05 NOTE — ED Provider Notes (Signed)
[name]    CSN: [id number] Arrival date & time: 11/05/19  1216      History   Chief Complaint Chief Complaint  Patient presents with  . Burn    HPI Nina Pitts is a 59 y.o. female with history of hypothyroidism, hypertension, obesity presenting for burn to right arm.  States she went to pick up a cup of coffee when the water overflowed and hit her hand.  Patient denies blistering, severe pain, numbness, discoloration.  Applied cold water with some relief.  Past Medical History:  Diagnosis Date  . Anxiety   . Arthritis    "joints" (02/03/2013)  . Carpal tunnel syndrome   . Fatty liver disease, nonalcoholic dx'd "~ 01/8109"  . Fibromyalgia   . Gallstones   . GERD (gastroesophageal reflux disease)   . Gout attack ~ 2011  . Hypertension   . Hypothyroidism   . IBS (irritable bowel syndrome)   . Obesity   . Osteoarthritis of both knees   . PTSD (post-traumatic stress disorder)     Patient Active Problem List   Diagnosis Date Noted  . Incarcerated ventral hernia 10/17/2015  . Aortic atherosclerosis (HCC) 03/21/2015  . Fever 02/08/2013  . Abdominal pain 02/08/2013  . IBS (irritable bowel syndrome) 11/17/2012  . Unspecified hypothyroidism 11/17/2012  . Obesity, unspecified 11/17/2012    Past Surgical History:  Procedure Laterality Date  . ABDOMINAL EXPLORATION SURGERY  04/27/2001   w/LOA/notes 04/27/2001 (02/03/2013)  . ABDOMINAL HYSTERECTOMY  ~ 2000   partial  . APPENDECTOMY  ~ 1974  . AXILLARY HIDRADENITIS EXCISION Bilateral 1990's   with stsg  . BILATERAL SALPINGOOPHORECTOMY Bilateral 04/27/2001   Lonie Perch 04/27/2001 (02/03/2013)  . CARDIAC CATHETERIZATION  ~ 2005  . CESAREAN SECTION  1979; 1980; 1986  . CHOLECYSTECTOMY  02/03/2013  . CHOLECYSTECTOMY N/A 02/03/2013   Procedure: LAPAROSCOPIC CHOLECYSTECTOMY WITH INTRAOPERATIVE CHOLANGIOGRAM;  Surgeon: [name], MD;  Location: [facility];  Service: General;  Laterality: N/A;  . ERCP N/A  02/09/2013   Procedure: ENDOSCOPIC RETROGRADE CHOLANGIOPANCREATOGRAPHY (ERCP) with Stent Placement;  Surgeon: [name], MD;  Location: [facility] OR;  Service: Endoscopy;  Laterality: N/A;  . ESOPHAGOGASTRODUODENOSCOPY N/A 03/28/2013   Procedure: ESOPHAGOGASTRODUODENOSCOPY (EGD);  Surgeon: [name], MD;  Location: [facility];  Service: Endoscopy;  Laterality: N/A;  . HERNIA REPAIR  ~ 1974   [facility]   . INCISIONAL HERNIA REPAIR  10/17/2015  . INCISIONAL HERNIA REPAIR N/A 10/17/2015   Procedure: LAPAROSCOPIC INCISIONAL HERNIA WITH MESH ;  Surgeon: [name], MD;  Location: [facility] OR;  Service: General;  Laterality: N/A;  . INSERTION OF MESH N/A 10/17/2015   Procedure: INSERTION OF MESH;  Surgeon: [name], MD;  Location: [facility] OR;  Service: General;  Laterality: N/A;  . KNEE ARTHROSCOPY Right 1990'S  . PILONIDAL CYST EXCISION  2000?    OB History   No obstetric history on file.      Home Medications    Prior to Admission medications   Medication Sig Start Date End Date Taking? Authorizing Provider  aspirin 81 MG chewable tablet Chew 81 mg by mouth daily.   Yes [provider]  atorvastatin (LIPITOR) 20 MG tablet Take 20 mg by mouth daily.  02/16/13  Yes [provider]  levothyroxine (SYNTHROID, LEVOTHROID) 150 MCG tablet Take 150 mcg by mouth daily before breakfast.   Yes [provider]  levothyroxine (SYNTHROID, LEVOTHROID) 175 MCG tablet Take 175 mcg by mouth daily before breakfast.   Yes [provider]  losartan (COZAAR) 50 MG tablet Take 50 mg by mouth daily.   Yes [provider]  Multiple Vitamin (MULTIVITAMIN) tablet Take 1 tablet by mouth daily.   Yes [provider]  pantoprazole (PROTONIX) 40 MG tablet Take 40 mg by mouth daily.   Yes [provider]  tiZANidine (ZANAFLEX) 4 MG tablet Take 4 mg by mouth every 6 (six) hours as needed for muscle spasms.   Yes [provider]  Vitamin D, Ergocalciferol,  (DRISDOL) 50000 units CAPS capsule Take 50,000 Units by mouth every 7 (seven) days.   Yes [provider]  diphenhydrAMINE (BENADRYL) 25 MG tablet Take 1 tablet (25 mg total) by mouth every 6 (six) hours as needed for itching (Rash). 12/09/16   Muthersbaugh, Jarrett Soho, PA-C  docusate sodium (COLACE) 100 MG capsule Take 1 capsule (100 mg total) by mouth 2 (two) times daily. 10/18/15   Rolm Bookbinder, MD  ibuprofen (ADVIL,MOTRIN) 800 MG tablet Take 1 tablet (800 mg total) by mouth 3 (three) times daily. 05/12/18   Vanessa Kick, MD  metaxalone (SKELAXIN) 800 MG tablet Take 800 mg by mouth 3 (three) times daily as needed for pain.     [provider]  naproxen (NAPROSYN) 500 MG tablet Take 1 tablet (500 mg total) by mouth 2 (two) times daily. 02/04/16   Etta Quill, NP  olopatadine (PATANOL) 0.1 % ophthalmic solution Place 1 drop into both eyes 2 (two) times daily. 12/24/15   Melynda Ripple, MD  promethazine (PHENERGAN) 12.5 MG tablet Take 1 tablet (12.5 mg total) by mouth every 6 (six) hours as needed for nausea or vomiting. 10/18/15   Rolm Bookbinder, MD    Family History Family History  Problem Relation Age of Onset  . Pancreatic cancer Father   . Esophageal cancer Father   . Cancer Father        esophagus, lungs  . Diabetes Mother   . Heart disease Mother   . Cancer Paternal Grandmother        breast    Social History Social History   Tobacco Use  . Smoking status: Former Smoker    Packs/day: 0.50    Years: 15.00    Pack years: 7.50    Types: Cigarettes    Quit date: 11/18/1998    Years since quitting: 20.9  . Smokeless tobacco: Never Used  Substance Use Topics  . Alcohol use: No  . Drug use: No     Allergies   Percocet [oxycodone-acetaminophen], Aspirin, Robaxin [methocarbamol], Vicodin [hydrocodone-acetaminophen], and Tramadol   Review of Systems As per HPI   Physical Exam Triage Vital Signs ED Triage Vitals  Enc Vitals Group     BP 11/05/19  1336 130/75     Pulse Rate 11/05/19 1336 (!) 52     Resp 11/05/19 1336 18     Temp 11/05/19 1336 97.7 F (36.5 C)     Temp Source 11/05/19 1336 Oral     SpO2 11/05/19 1336 97 %     Weight --      Height --      Head Circumference --      Peak Flow --      Pain Score 11/05/19 1332 7     Pain Loc --      Pain Edu? --      Excl. in [place]? --    No data found.  Updated Vital Signs BP 130/75 (BP Location: Left Arm) Comment (BP Location): large cuff  Pulse Marland Kitchen)  52   Temp 97.7 F (36.5 C) (Oral)   Resp 18   SpO2 97%   Visual Acuity Right Eye Distance:   Left Eye Distance:   Bilateral Distance:    Right Eye Near:   Left Eye Near:    Bilateral Near:     Physical Exam Constitutional:      General: She is not in acute distress. HENT:     Head: Normocephalic and atraumatic.  Eyes:     General: No scleral icterus.    Pupils: Pupils are equal, round, and reactive to light.  Cardiovascular:     Rate and Rhythm: Normal rate.  Pulmonary:     Effort: Pulmonary effort is normal.  Musculoskeletal:        General: Tenderness present. No swelling. Normal range of motion.     Comments: Mild tenderness diffusely over palmar aspect of right hand.  Neurovascularly intact; strength 5/5 bilaterally  Skin:    Coloration: Skin is not jaundiced or pale.     Findings: Erythema present.     Comments: No warmth  Neurological:     Mental Status: She is alert and oriented to person, place, and time.      UC Treatments / Results  Labs (all labs ordered are listed, but only abnormal results are displayed) Labs Reviewed - No data to display  EKG   Radiology No results found.  Procedures Procedures (including critical care time)  Medications Ordered in UC Medications - No data to display  Initial Impression / Assessment and Plan / UC Course  I have reviewed the triage vital signs and the nursing notes.  Pertinent labs & imaging results that were available during my care of the  patient were reviewed by me and considered in my medical decision making (see chart for details).     Patient afebrile, nontoxic, without open wound, blisters.  Applied Ace wrap in office and provide anticipatory guidance regarding burn care.  Return precautions discussed, patient verbalized understanding and is agreeable to plan. Final Clinical Impressions(s) / UC Diagnoses   Final diagnoses:  Superficial burn of palm of right hand, initial encounter     Discharge Instructions     Keep area clean and dry. If blisters do form do NOT pop them!!! May keep wrapped so as to avoid irritating skin further. Expect this to resolve over the next 3-7 days. Return for worsening pain, swelling, blistering, fever.    ED Prescriptions    None     PDMP not reviewed this encounter.   [place], [place], [place] 11/05/19 1420

## 2019-11-05 NOTE — ED Triage Notes (Signed)
Picked up cup of coffee, coffee was overfilled and coffee splashed on back of hand as well as palm.  Patient feels burning and swelling.

## 2019-11-05 NOTE — Discharge Instructions (Signed)
Keep area clean and dry. If blisters do form do NOT pop them!!! May keep wrapped so as to avoid irritating skin further. Expect this to resolve over the next 3-7 days. Return for worsening pain, swelling, blistering, fever.

## 2019-12-21 MED FILL — SYNTHROID 175 MCG TABLET: 175 | 28 days supply | Qty: 20 | Fill #5

## 2019-12-21 MED FILL — SYNTHROID 150 MCG TABLET: 150 | 28 days supply | Qty: 8 | Fill #1

## 2019-12-21 MED FILL — ATORVASTATIN 20 MG TABLET: 20 | 90 days supply | Qty: 45 | Fill #1

## 2019-12-26 MED FILL — tiZANidine HCL 4 MG TABS: 4 | 30 days supply | Qty: 90 | Fill #0

## 2019-12-27 MED FILL — clonazePAM 0.5 MG TABS: 0.5 | 30 days supply | Qty: 60 | Fill #0

## 2020-01-03 DIAGNOSIS — I10 Essential (primary) hypertension: Secondary | ICD-10-CM | POA: Diagnosis not present

## 2020-01-03 DIAGNOSIS — E559 Vitamin D deficiency, unspecified: Secondary | ICD-10-CM | POA: Diagnosis not present

## 2020-01-03 DIAGNOSIS — E7800 Pure hypercholesterolemia, unspecified: Secondary | ICD-10-CM | POA: Diagnosis not present

## 2020-01-03 DIAGNOSIS — N3090 Cystitis, unspecified without hematuria: Secondary | ICD-10-CM | POA: Diagnosis not present

## 2020-01-03 DIAGNOSIS — E039 Hypothyroidism, unspecified: Secondary | ICD-10-CM | POA: Diagnosis not present

## 2020-01-03 DIAGNOSIS — K219 Gastro-esophageal reflux disease without esophagitis: Secondary | ICD-10-CM | POA: Diagnosis not present

## 2020-01-03 MED FILL — tiZANidine HCL 4 MG TABS: 4 | 30 days supply | Qty: 90 | Fill #0

## 2020-01-03 MED FILL — NITROFURANTOIN MONO-MCR 100: 100 | 7 days supply | Qty: 14 | Fill #0

## 2020-01-03 MED FILL — clonazePAM 0.5 MG TABS: 0.5 | 30 days supply | Qty: 60 | Fill #0

## 2020-01-12 ENCOUNTER — Other Ambulatory Visit (HOSPITAL_COMMUNITY): Payer: Self-pay | Admitting: Internal Medicine

## 2020-01-12 MED FILL — SYNTHROID 150 MCG TABLET: 150 | 28 days supply | Qty: 8 | Fill #2

## 2020-01-12 MED FILL — LOSARTAN POTASSIUM 50 MG TA: 50 | 90 days supply | Qty: 90 | Fill #0

## 2020-01-12 MED FILL — SYNTHROID 175 MCG TABLET: 175 | 28 days supply | Qty: 20 | Fill #6

## 2020-01-17 DIAGNOSIS — M25512 Pain in left shoulder: Secondary | ICD-10-CM | POA: Diagnosis not present

## 2020-01-17 DIAGNOSIS — M25561 Pain in right knee: Secondary | ICD-10-CM | POA: Diagnosis not present

## 2020-01-17 DIAGNOSIS — M1711 Unilateral primary osteoarthritis, right knee: Secondary | ICD-10-CM | POA: Diagnosis not present

## 2020-02-27 MED FILL — AMOX-CLAV 500-125 MG TABLET: 500-125 | 7 days supply | Qty: 14 | Fill #0

## 2020-02-29 MED FILL — SYNTHROID 175 MCG TABLET: 175 | 28 days supply | Qty: 20 | Fill #7

## 2020-02-29 MED FILL — SYNTHROID 150 MCG TABLET: 150 | 28 days supply | Qty: 8 | Fill #3

## 2020-03-13 ENCOUNTER — Emergency Department (HOSPITAL_COMMUNITY): Payer: Medicare Other

## 2020-03-13 ENCOUNTER — Encounter (HOSPITAL_COMMUNITY): Payer: Self-pay | Admitting: *Deleted

## 2020-03-13 ENCOUNTER — Other Ambulatory Visit: Payer: Self-pay

## 2020-03-13 ENCOUNTER — Inpatient Hospital Stay (HOSPITAL_COMMUNITY)
Admission: EM | Admit: 2020-03-13 | Discharge: 2020-03-20 | DRG: 177 | Disposition: A | Discharge status: Home / Self Care | Payer: Medicare Other | Attending: Internal Medicine | Admitting: Internal Medicine

## 2020-03-13 DIAGNOSIS — J1282 Pneumonia due to coronavirus disease 2019: Secondary | ICD-10-CM | POA: Diagnosis not present

## 2020-03-13 DIAGNOSIS — U071 COVID-19: Secondary | ICD-10-CM | POA: Diagnosis not present

## 2020-03-13 DIAGNOSIS — Z87891 Personal history of nicotine dependence: Secondary | ICD-10-CM | POA: Diagnosis not present

## 2020-03-13 DIAGNOSIS — Z6837 Body mass index (BMI) 37.0-37.9, adult: Secondary | ICD-10-CM

## 2020-03-13 DIAGNOSIS — Z79890 Hormone replacement therapy (postmenopausal): Secondary | ICD-10-CM | POA: Diagnosis not present

## 2020-03-13 DIAGNOSIS — J9601 Acute respiratory failure with hypoxia: Secondary | ICD-10-CM | POA: Diagnosis not present

## 2020-03-13 DIAGNOSIS — Z79899 Other long term (current) drug therapy: Secondary | ICD-10-CM | POA: Diagnosis not present

## 2020-03-13 DIAGNOSIS — Z743 Need for continuous supervision: Secondary | ICD-10-CM | POA: Diagnosis not present

## 2020-03-13 DIAGNOSIS — E782 Mixed hyperlipidemia: Secondary | ICD-10-CM | POA: Diagnosis not present

## 2020-03-13 DIAGNOSIS — M797 Fibromyalgia: Secondary | ICD-10-CM | POA: Diagnosis present

## 2020-03-13 DIAGNOSIS — F4310 Post-traumatic stress disorder, unspecified: Secondary | ICD-10-CM | POA: Diagnosis present

## 2020-03-13 DIAGNOSIS — R05 Cough: Secondary | ICD-10-CM | POA: Diagnosis not present

## 2020-03-13 DIAGNOSIS — R0902 Hypoxemia: Secondary | ICD-10-CM | POA: Diagnosis not present

## 2020-03-13 DIAGNOSIS — I10 Essential (primary) hypertension: Secondary | ICD-10-CM | POA: Diagnosis not present

## 2020-03-13 DIAGNOSIS — K760 Fatty (change of) liver, not elsewhere classified: Secondary | ICD-10-CM | POA: Diagnosis present

## 2020-03-13 DIAGNOSIS — J430 Unilateral pulmonary emphysema [MacLeod's syndrome]: Secondary | ICD-10-CM

## 2020-03-13 DIAGNOSIS — R918 Other nonspecific abnormal finding of lung field: Secondary | ICD-10-CM | POA: Diagnosis not present

## 2020-03-13 DIAGNOSIS — R000 Tachycardia, unspecified: Secondary | ICD-10-CM | POA: Diagnosis not present

## 2020-03-13 DIAGNOSIS — K219 Gastro-esophageal reflux disease without esophagitis: Secondary | ICD-10-CM | POA: Diagnosis not present

## 2020-03-13 DIAGNOSIS — E039 Hypothyroidism, unspecified: Secondary | ICD-10-CM | POA: Diagnosis present

## 2020-03-13 DIAGNOSIS — J439 Emphysema, unspecified: Secondary | ICD-10-CM | POA: Diagnosis present

## 2020-03-13 DIAGNOSIS — E669 Obesity, unspecified: Secondary | ICD-10-CM | POA: Diagnosis present

## 2020-03-13 DIAGNOSIS — R0602 Shortness of breath: Secondary | ICD-10-CM

## 2020-03-13 DIAGNOSIS — Z7982 Long term (current) use of aspirin: Secondary | ICD-10-CM

## 2020-03-13 DIAGNOSIS — R059 Cough, unspecified: Secondary | ICD-10-CM

## 2020-03-13 DIAGNOSIS — Z90710 Acquired absence of both cervix and uterus: Secondary | ICD-10-CM

## 2020-03-13 DIAGNOSIS — R7989 Other specified abnormal findings of blood chemistry: Secondary | ICD-10-CM | POA: Diagnosis not present

## 2020-03-13 LAB — COMPREHENSIVE METABOLIC PANEL
ALT: 13 U/L (ref 0–44)
AST: 16 U/L (ref 15–41)
Albumin: 3.1 g/dL — ABNORMAL LOW (ref 3.5–5.0)
Alkaline Phosphatase: 60 U/L (ref 38–126)
Anion gap: 8 (ref 5–15)
BUN: 8 mg/dL (ref 6–20)
CO2: 27 mmol/L (ref 22–32)
Calcium: 8.8 mg/dL — ABNORMAL LOW (ref 8.9–10.3)
Chloride: 105 mmol/L (ref 98–111)
Creatinine, Ser: 0.72 mg/dL (ref 0.44–1.00)
GFR calc Af Amer: >60 mL/min (ref >60)
GFR calc non Af Amer: >60 mL/min (ref >60)
Glucose, Bld: 133 mg/dL — ABNORMAL HIGH (ref 70–99)
Potassium: 3.6 mmol/L (ref 3.5–5.1)
Sodium: 140 mmol/L (ref 135–145)
Total Bilirubin: 0.5 mg/dL (ref 0.3–1.2)
Total Protein: 8.1 g/dL (ref 6.5–8.1)

## 2020-03-13 LAB — CBC WITH DIFFERENTIAL/PLATELET
Abs Immature Granulocytes: 0.03 10*3/uL (ref 0.00–0.07)
Basophils Absolute: 0 10*3/uL (ref 0.0–0.1)
Basophils Relative: 0 %
Eosinophils Absolute: 0.2 10*3/uL (ref 0.0–0.5)
Eosinophils Relative: 2 %
HCT: 43.1 % (ref 36.0–46.0)
Hemoglobin: 13.6 g/dL (ref 12.0–15.0)
Immature Granulocytes: 0 %
Lymphocytes Relative: 18 %
Lymphs Abs: 1.6 10*3/uL (ref 0.7–4.0)
MCH: 30.4 pg (ref 26.0–34.0)
MCHC: 31.6 g/dL (ref 30.0–36.0)
MCV: 96.2 fL (ref 80.0–100.0)
Monocytes Absolute: 0.6 10*3/uL (ref 0.1–1.0)
Monocytes Relative: 7 %
Neutro Abs: 6.5 10*3/uL (ref 1.7–7.7)
Neutrophils Relative %: 73 %
Platelets: 323 10*3/uL (ref 150–400)
RBC: 4.48 MIL/uL (ref 3.87–5.11)
RDW: 15.1 % (ref 11.5–15.5)
WBC: 8.9 10*3/uL (ref 4.0–10.5)
nRBC: 0 % (ref 0.0–0.2)

## 2020-03-13 LAB — LACTIC ACID, PLASMA: Lactic Acid, Venous: 1.5 mmol/L (ref 0.5–1.9)

## 2020-03-13 LAB — D-DIMER, QUANTITATIVE: D-Dimer, Quant: 2.31 ug/mL-FEU — ABNORMAL HIGH (ref 0.00–0.50)

## 2020-03-13 LAB — TROPONIN I (HIGH SENSITIVITY)
Troponin I (High Sensitivity): 4 ng/L (ref <18)
Troponin I (High Sensitivity): 4 ng/L (ref <18)

## 2020-03-13 LAB — RESPIRATORY PANEL BY RT PCR (FLU A&B, COVID)
Influenza A by PCR: NEGATIVE
Influenza B by PCR: NEGATIVE
SARS Coronavirus 2 by RT PCR: POSITIVE — AB

## 2020-03-13 MED ORDER — SODIUM CHLORIDE 0.9 % IV SOLN: 200.0000 mg | Freq: Once | INTRAVENOUS | Status: DC

## 2020-03-13 MED ORDER — DEXAMETHASONE SODIUM PHOSPHATE 10 MG/ML IJ SOLN: 6.0000 mg | INTRAMUSCULAR | Status: DC

## 2020-03-13 MED ORDER — ALBUTEROL SULFATE HFA 108 (90 BASE) MCG/ACT IN AERS: 2.0000 | INHALATION_SPRAY | RESPIRATORY_TRACT | Status: DC | PRN

## 2020-03-13 MED ORDER — CLONAZEPAM 0.125 MG PO TBDP
0.5000 mg | ORAL_TABLET | Freq: Once | ORAL | Status: AC
  Administered 2020-03-14: 0.5 mg via ORAL
  Filled 2020-03-13: qty 4

## 2020-03-13 MED ORDER — VITAMIN D 25 MCG (1000 UNIT) PO TABS
1000.0000 [IU] | ORAL_TABLET | Freq: Every day | ORAL | Status: DC
  Administered 2020-03-14 – 2020-03-20 (×7): 1000 [IU] via ORAL
  Filled 2020-03-13 (×7): qty 1

## 2020-03-13 MED ORDER — CLONAZEPAM 0.5 MG PO TABS
0.5000 mg | ORAL_TABLET | Freq: Two times a day (BID) | ORAL | Status: DC | PRN
  Administered 2020-03-14: 0.5 mg via ORAL
  Filled 2020-03-13: qty 1

## 2020-03-13 MED ORDER — DEXAMETHASONE SODIUM PHOSPHATE 10 MG/ML IJ SOLN
6.0000 mg | Freq: Once | INTRAMUSCULAR | Status: AC
  Administered 2020-03-14: 6 mg via INTRAVENOUS
  Filled 2020-03-13: qty 1

## 2020-03-13 MED ORDER — LEVOTHYROXINE SODIUM 75 MCG PO TABS
175.0000 ug | ORAL_TABLET | ORAL | Status: DC
  Administered 2020-03-15 – 2020-03-20 (×4): 175 ug via ORAL
  Filled 2020-03-13 (×3): qty 1

## 2020-03-13 MED ORDER — LACTATED RINGERS IV SOLN: INTRAVENOUS | Status: DC

## 2020-03-13 MED ORDER — ATORVASTATIN CALCIUM 20 MG PO TABS
20.0000 mg | ORAL_TABLET | Freq: Every day | ORAL | Status: DC
  Administered 2020-03-14 – 2020-03-20 (×7): 20 mg via ORAL
  Filled 2020-03-13 (×3): qty 1
  Filled 2020-03-13: qty 2
  Filled 2020-03-13 (×3): qty 1

## 2020-03-13 MED ORDER — HYDRALAZINE HCL 20 MG/ML IJ SOLN: 10.0000 mg | Freq: Four times a day (QID) | INTRAMUSCULAR | Status: DC | PRN

## 2020-03-13 MED ORDER — ENOXAPARIN SODIUM 60 MG/0.6ML ~~LOC~~ SOLN
50.0000 mg | SUBCUTANEOUS | Status: DC
  Administered 2020-03-14 – 2020-03-20 (×7): 50 mg via SUBCUTANEOUS
  Filled 2020-03-13 (×4): qty 0.6
  Filled 2020-03-13: qty 0.5
  Filled 2020-03-13: qty 0.6
  Filled 2020-03-13: qty 0.5
  Filled 2020-03-13: qty 0.6

## 2020-03-13 MED ORDER — ZINC SULFATE 220 (50 ZN) MG PO CAPS
220.0000 mg | ORAL_CAPSULE | Freq: Every day | ORAL | Status: DC
  Administered 2020-03-14 – 2020-03-20 (×7): 220 mg via ORAL
  Filled 2020-03-13 (×7): qty 1

## 2020-03-13 MED ORDER — PANTOPRAZOLE SODIUM 40 MG PO TBEC
40.0000 mg | DELAYED_RELEASE_TABLET | Freq: Every day | ORAL | Status: DC
  Administered 2020-03-14 – 2020-03-20 (×7): 40 mg via ORAL
  Filled 2020-03-13 (×7): qty 1

## 2020-03-13 MED ORDER — ONDANSETRON HCL 4 MG PO TABS: 4.0000 mg | ORAL_TABLET | Freq: Four times a day (QID) | ORAL | Status: DC | PRN

## 2020-03-13 MED ORDER — LOSARTAN POTASSIUM 50 MG PO TABS
50.0000 mg | ORAL_TABLET | Freq: Every day | ORAL | Status: DC
  Administered 2020-03-14 – 2020-03-20 (×7): 50 mg via ORAL
  Filled 2020-03-13 (×9): qty 1

## 2020-03-13 MED ORDER — ASCORBIC ACID 500 MG PO TABS
500.0000 mg | ORAL_TABLET | Freq: Every day | ORAL | Status: DC
  Administered 2020-03-14 – 2020-03-20 (×7): 500 mg via ORAL
  Filled 2020-03-13 (×8): qty 1

## 2020-03-13 MED ORDER — SODIUM CHLORIDE 0.9 % IV SOLN: 100.0000 mg | Freq: Every day | INTRAVENOUS | Status: DC

## 2020-03-13 MED ORDER — POLYETHYLENE GLYCOL 3350 17 G PO PACK
17.0000 g | PACK | Freq: Every day | ORAL | Status: DC | PRN
  Administered 2020-03-15: 17 g via ORAL
  Filled 2020-03-13: qty 1

## 2020-03-13 MED ORDER — ONDANSETRON HCL 4 MG/2ML IJ SOLN: 4.0000 mg | Freq: Four times a day (QID) | INTRAMUSCULAR | Status: DC | PRN

## 2020-03-13 MED ORDER — GUAIFENESIN-DM 100-10 MG/5ML PO SYRP: 10.0000 mL | ORAL_SOLUTION | ORAL | Status: DC | PRN

## 2020-03-13 MED ORDER — ACETAMINOPHEN 325 MG PO TABS
650.0000 mg | ORAL_TABLET | Freq: Four times a day (QID) | ORAL | Status: DC | PRN
  Administered 2020-03-14: 650 mg via ORAL
  Filled 2020-03-13: qty 2

## 2020-03-13 MED ORDER — METAXALONE 800 MG PO TABS
800.0000 mg | ORAL_TABLET | Freq: Three times a day (TID) | ORAL | Status: DC | PRN
  Filled 2020-03-13: qty 1

## 2020-03-13 MED ORDER — IOHEXOL 350 MG/ML SOLN
100.0000 mL | Freq: Once | INTRAVENOUS | Status: AC | PRN
  Administered 2020-03-13: 100 mL via INTRAVENOUS

## 2020-03-13 MED ORDER — SODIUM CHLORIDE 0.9 % IV SOLN
100.0000 mg | Freq: Every day | INTRAVENOUS | Status: AC
  Administered 2020-03-14 – 2020-03-17 (×4): 100 mg via INTRAVENOUS
  Filled 2020-03-13 (×5): qty 20

## 2020-03-13 MED ORDER — ASPIRIN 81 MG PO CHEW
81.0000 mg | CHEWABLE_TABLET | Freq: Every day | ORAL | Status: DC
  Administered 2020-03-14 – 2020-03-20 (×7): 81 mg via ORAL
  Filled 2020-03-13 (×7): qty 1

## 2020-03-13 MED ORDER — SODIUM CHLORIDE 0.9 % IV SOLN
200.0000 mg | Freq: Once | INTRAVENOUS | Status: AC
  Administered 2020-03-14: 200 mg via INTRAVENOUS
  Filled 2020-03-13: qty 200

## 2020-03-13 MED ORDER — LEVOTHYROXINE SODIUM 75 MCG PO TABS
150.0000 ug | ORAL_TABLET | ORAL | Status: DC
  Administered 2020-03-17 – 2020-03-18 (×2): 150 ug via ORAL
  Filled 2020-03-13 (×4): qty 2

## 2020-03-13 NOTE — ED Triage Notes (Signed)
Per EMS-was at PCP-being treated for couch, nasal congestion, low O2-has been treated with antibiotics-symptoms for 5 weeks-placed on 4L [place] 88%

## 2020-03-13 NOTE — ED Provider Notes (Signed)
[facility]-EMERGENCY DEPT Provider Note   CSN: [id number] Arrival date & time: 03/13/20  1210     History Chief Complaint  Patient presents with  . Nasal Congestion  . Cough    Nina Pitts is a 59 y.o. female.  The history is provided by the patient and medical records. No language interpreter was used.  Cough Cough characteristics:  Productive Sputum characteristics:  White Severity:  Severe Onset quality:  Gradual Duration:  4 days Timing:  Constant Progression:  Waxing and waning Chronicity:  New Relieved by:  Nothing Worsened by:  Nothing Ineffective treatments:  None tried Associated symptoms: chest pain, chills, rhinorrhea, shortness of breath and sinus congestion   Associated symptoms: no diaphoresis, no fever, no headaches, no rash and no wheezing        Past Medical History:  Diagnosis Date  . Anxiety   . Arthritis    "joints" (02/03/2013)  . Carpal tunnel syndrome   . Fatty liver disease, nonalcoholic dx'd "~ 09/979"  . Fibromyalgia   . Gallstones   . GERD (gastroesophageal reflux disease)   . Gout attack ~ 2011  . Hypertension   . Hypothyroidism   . IBS (irritable bowel syndrome)   . Obesity   . Osteoarthritis of both knees   . PTSD (post-traumatic stress disorder)     Patient Active Problem List   Diagnosis Date Noted  . Incarcerated ventral hernia 10/17/2015  . Aortic atherosclerosis (HCC) 03/21/2015  . Fever 02/08/2013  . Abdominal pain 02/08/2013  . IBS (irritable bowel syndrome) 11/17/2012  . Unspecified hypothyroidism 11/17/2012  . Obesity, unspecified 11/17/2012    Past Surgical History:  Procedure Laterality Date  . ABDOMINAL EXPLORATION SURGERY  04/27/2001   w/LOA/notes 04/27/2001 (02/03/2013)  . ABDOMINAL HYSTERECTOMY  ~ 2000   partial  . APPENDECTOMY  ~ 1974  . AXILLARY HIDRADENITIS EXCISION Bilateral 1990's   with stsg  . BILATERAL SALPINGOOPHORECTOMY Bilateral 04/27/2001   Sahana Perch 04/27/2001  (02/03/2013)  . CARDIAC CATHETERIZATION  ~ 2005  . CESAREAN SECTION  1979; 1980; 1986  . CHOLECYSTECTOMY  02/03/2013  . CHOLECYSTECTOMY N/A 02/03/2013   Procedure: LAPAROSCOPIC CHOLECYSTECTOMY WITH INTRAOPERATIVE CHOLANGIOGRAM;  Surgeon: [name], MD;  [facility];  Service: General;  Laterality: N/A;  . ERCP N/A 02/09/2013   Procedure: ENDOSCOPIC RETROGRADE CHOLANGIOPANCREATOGRAPHY (ERCP) with Stent Placement;  Surgeon: [name], MD;  Location: [facility] OR;  Service: Endoscopy;  Laterality: N/A;  . ESOPHAGOGASTRODUODENOSCOPY N/A 03/28/2013   Procedure: ESOPHAGOGASTRODUODENOSCOPY (EGD);  Surgeon: [name], MD;  Location: [facility];  Service: Endoscopy;  Laterality: N/A;  . HERNIA REPAIR  ~ 1974   [facility]   . INCISIONAL HERNIA REPAIR  10/17/2015  . INCISIONAL HERNIA REPAIR N/A 10/17/2015   Procedure: LAPAROSCOPIC INCISIONAL HERNIA WITH MESH ;  Surgeon: [name], MD;  Location: [facility] OR;  Service: General;  Laterality: N/A;  . INSERTION OF MESH N/A 10/17/2015   Procedure: INSERTION OF MESH;  Surgeon: [name], MD;  Location: [facility] OR;  Service: General;  Laterality: N/A;  . KNEE ARTHROSCOPY Right 1990'S  . PILONIDAL CYST EXCISION  2000?     OB History   No obstetric history on file.     Family History  Problem Relation Age of Onset  . Pancreatic cancer Father   . Esophageal cancer Father   . Cancer Father        esophagus, lungs  . Diabetes Mother   . Heart disease Mother   . Cancer Paternal  Grandmother        breast    Social History   Tobacco Use  . Smoking status: Former Smoker    Packs/day: 0.50    Years: 15.00    Pack years: 7.50    Types: Cigarettes    Quit date: 11/18/1998    Years since quitting: 21.3  . Smokeless tobacco: Never Used  Substance Use Topics  . Alcohol use: No  . Drug use: No    Home Medications Prior to Admission medications   Medication Sig Start Date End Date Taking? Authorizing Provider  aspirin 81 MG chewable tablet Chew  81 mg by mouth daily.    [provider]  atorvastatin (LIPITOR) 20 MG tablet Take 20 mg by mouth daily.  02/16/13   [provider]  diphenhydrAMINE (BENADRYL) 25 MG tablet Take 1 tablet (25 mg total) by mouth every 6 (six) hours as needed for itching (Rash). 12/09/16   Muthersbaugh, Dahlia Client, PA-C  docusate sodium (COLACE) 100 MG capsule Take 1 capsule (100 mg total) by mouth 2 (two) times daily. 10/18/15   [name], MD  ibuprofen (ADVIL,MOTRIN) 800 MG tablet Take 1 tablet (800 mg total) by mouth 3 (three) times daily. 05/12/18   Mardella Layman, MD  levothyroxine (SYNTHROID, LEVOTHROID) 150 MCG tablet Take 150 mcg by mouth daily before breakfast.    [provider]  levothyroxine (SYNTHROID, LEVOTHROID) 175 MCG tablet Take 175 mcg by mouth daily before breakfast.    [provider]  losartan (COZAAR) 50 MG tablet Take 50 mg by mouth daily.    [provider]  metaxalone (SKELAXIN) 800 MG tablet Take 800 mg by mouth 3 (three) times daily as needed for pain.     [provider]  Multiple Vitamin (MULTIVITAMIN) tablet Take 1 tablet by mouth daily.    [provider]  naproxen (NAPROSYN) 500 MG tablet Take 1 tablet (500 mg total) by mouth 2 (two) times daily. 02/04/16   Felicie Morn, NP  olopatadine (PATANOL) 0.1 % ophthalmic solution Place 1 drop into both eyes 2 (two) times daily. 12/24/15   Domenick Gong, MD  pantoprazole (PROTONIX) 40 MG tablet Take 40 mg by mouth daily.    [provider]  promethazine (PHENERGAN) 12.5 MG tablet Take 1 tablet (12.5 mg total) by mouth every 6 (six) hours as needed for nausea or vomiting. 10/18/15   [name], MD  tiZANidine (ZANAFLEX) 4 MG tablet Take 4 mg by mouth every 6 (six) hours as needed for muscle spasms.    [provider]  Vitamin D, Ergocalciferol, (DRISDOL) 50000 units CAPS capsule Take 50,000 Units by mouth every 7 (seven) days.    [provider]     Allergies    Percocet [oxycodone-acetaminophen], Aspirin, Robaxin [methocarbamol], Vicodin [hydrocodone-acetaminophen], and Tramadol  Review of Systems   Review of Systems  Constitutional: Positive for chills and fatigue. Negative for diaphoresis and fever.  HENT: Positive for congestion and rhinorrhea.   Eyes: Negative for visual disturbance.  Respiratory: Positive for cough, chest tightness and shortness of breath. Negative for wheezing and stridor.   Cardiovascular: Positive for chest pain and palpitations. Negative for leg swelling.  Gastrointestinal: Negative for abdominal pain, constipation, diarrhea, nausea and vomiting.  Genitourinary: Negative for flank pain.  Musculoskeletal: Negative for back pain, neck pain and neck stiffness.  Skin: Negative for rash.  Neurological: Negative for dizziness, light-headedness and headaches.  Psychiatric/Behavioral: Negative for agitation and confusion.  All other systems reviewed and are negative.  Physical Exam Updated Vital Signs BP (!) 178/111 (BP Location: Left Arm)   Pulse 88   Temp 98.2 F (36.8 C) (Oral)   Resp 16   SpO2 (!) 88%   Physical Exam Vitals and nursing note reviewed.  Constitutional:      General: She is not in acute distress.    Appearance: She is well-developed. She is not ill-appearing, toxic-appearing or diaphoretic.  HENT:     Head: Normocephalic and atraumatic.     Nose: Congestion and rhinorrhea present.     Mouth/Throat:     Mouth: Mucous membranes are moist.     Pharynx: No oropharyngeal exudate or posterior oropharyngeal erythema.  Eyes:     Conjunctiva/sclera: Conjunctivae normal.     Pupils: Pupils are equal, round, and reactive to light.  Cardiovascular:     Rate and Rhythm: Normal rate and regular rhythm.     Heart sounds: No murmur heard.   Pulmonary:     Effort: Respiratory distress present.     Breath sounds: No stridor. Rhonchi present. No wheezing or rales.  Chest:     Chest wall:  No tenderness.  Abdominal:     General: Abdomen is flat.     Palpations: Abdomen is soft.     Tenderness: There is no abdominal tenderness. There is no right CVA tenderness, left CVA tenderness, guarding or rebound.  Musculoskeletal:        General: No tenderness.     Cervical back: Neck supple. No tenderness.     Right lower leg: No edema.     Left lower leg: No edema.  Skin:    General: Skin is warm and dry.     Capillary Refill: Capillary refill takes less than 2 seconds.     Findings: No erythema or rash.  Neurological:     General: No focal deficit present.     Mental Status: She is alert.     Motor: No weakness.     Coordination: Coordination normal.  Psychiatric:        Mood and Affect: Mood normal.     ED Results / Procedures / Treatments   Labs (all labs ordered are listed, but only abnormal results are displayed) Labs Reviewed  RESPIRATORY PANEL BY RT PCR (FLU A&B, COVID) - Abnormal; Notable for the following components:      Result Value   SARS Coronavirus 2 by RT PCR POSITIVE (*)    All other components within normal limits  COMPREHENSIVE METABOLIC PANEL - Abnormal; Notable for the following components:   Glucose, Bld 133 (*)    Calcium 8.8 (*)    Albumin 3.1 (*)    All other components within normal limits  D-DIMER, QUANTITATIVE (NOT AT [facility]) - Abnormal; Notable for the following components:   D-Dimer, Quant 2.31 (*)    All other components within normal limits  CBC WITH DIFFERENTIAL/PLATELET  LACTIC ACID, PLASMA  LACTIC ACID, PLASMA  PROCALCITONIN  PROCALCITONIN  HIV ANTIBODY (ROUTINE TESTING W REFLEX)  CBC WITH DIFFERENTIAL/PLATELET  COMPREHENSIVE METABOLIC PANEL  C-REACTIVE PROTEIN  D-DIMER, QUANTITATIVE (NOT AT [facility])  MAGNESIUM  TROPONIN I (HIGH SENSITIVITY)  TROPONIN I (HIGH SENSITIVITY)    EKG None   Radiology CT Angio Chest PE W and/or Wo Contrast  Result Date: 03/13/2020 CLINICAL DATA:  Shortness of breath with congestion and cough.  EXAM: CT ANGIOGRAPHY CHEST WITH CONTRAST TECHNIQUE: Multidetector CT imaging of the chest was performed using the standard protocol during bolus administration of intravenous contrast. Multiplanar  CT image reconstructions and MIPs were obtained to evaluate the vascular anatomy. CONTRAST:  100mL OMNIPAQUE IOHEXOL 350 MG/ML SOLN COMPARISON:  None. FINDINGS: Cardiovascular: Satisfactory opacification of the pulmonary arteries to the segmental level. No evidence of pulmonary embolism. There is mild cardiomegaly. No pericardial effusion. Mediastinum/Nodes: There is mild mediastinal and bilateral hilar lymphadenopathy. The trachea and esophagus demonstrate no significant findings. Lungs/Pleura: There is marked severity emphysematous lung disease. Marked severity, predominately peripheral multifocal infiltrates are seen, bilaterally. There is a small left pleural effusion. No pneumothorax is identified Upper Abdomen: Multiple surgical clips are seen within the gallbladder fossa. Noninflamed diverticula are seen along the splenic flexure. Musculoskeletal: No chest wall abnormality. No acute or significant osseous findings. Review of the MIP images confirms the above findings. IMPRESSION: 1. Marked severity, predominately peripheral bilateral multifocal infiltrates. 2. Small left pleural effusion. 3. Marked severity emphysematous lung disease. 4. Colonic diverticulosis. Emphysema (ICD10-J43.9). Electronically Signed   By: Aram Candelahaddeus  Houston M.D.   On: 03/13/2020 20:25   DG Chest Portable 1 View  Result Date: 03/13/2020 CLINICAL DATA:  Cough and congestion EXAM: PORTABLE CHEST 1 VIEW COMPARISON:  February 08, 2013. FINDINGS: There is multifocal airspace opacity, primarily in the mid and lower lung regions. There is mild consolidation in the right base. Heart is upper normal in size with pulmonary vascularity normal. No adenopathy. No bone lesions. IMPRESSION: Multifocal airspace opacity. Appearance most consistent with  multifocal pneumonia. Atypical organism pneumonia may present in this manner. Given this appearance, correlation with COVID-19 status advised. Heart size is upper normal.  No adenopathy evident. Electronically Signed   By: Bretta BangWilliam  Woodruff III M.D.   On: 03/13/2020 15:17    Procedures Procedures (including critical care time)  CRITICAL CARE Performed by: Canary Brimhristopher J Katisha Shimizu Total critical care time: 35 minutes Critical care time was exclusive of separately billable procedures and treating other patients. Critical care was necessary to treat or prevent imminent or life-threatening deterioration. Critical care was time spent personally by me on the following activities: development of treatment plan with patient and/or surrogate as well as nursing, discussions with consultants, evaluation of patient's response to treatment, examination of patient, obtaining history from patient or surrogate, ordering and performing treatments and interventions, ordering and review of laboratory studies, ordering and review of radiographic studies, pulse oximetry and re-evaluation of patient's condition.   Medications Ordered in ED Medications  dexamethasone (DECADRON) injection 6 mg (has no administration in time range)  remdesivir 200 mg in sodium chloride 0.9% 250 mL IVPB (has no administration in time range)    Followed by  remdesivir 100 mg in sodium chloride 0.9 % 100 mL IVPB (has no administration in time range)  clonazepam (KLONOPIN) disintegrating tablet 0.5 mg (has no administration in time range)  aspirin chewable tablet 81 mg (has no administration in time range)  atorvastatin (LIPITOR) tablet 20 mg (has no administration in time range)  losartan (COZAAR) tablet 50 mg (has no administration in time range)  levothyroxine (SYNTHROID) tablet 150 mcg (has no administration in time range)  levothyroxine (SYNTHROID) tablet 175 mcg (has no administration in time range)  pantoprazole (PROTONIX) EC  tablet 40 mg (has no administration in time range)  clonazePAM (KLONOPIN) tablet 0.5 mg (has no administration in time range)  metaxalone (SKELAXIN) tablet 800 mg (has no administration in time range)  cholecalciferol (VITAMIN D3) tablet 1,000 Units (has no administration in time range)  enoxaparin (LOVENOX) injection 50 mg (has no administration in time range)  albuterol (VENTOLIN HFA) 108 (  90 Base) MCG/ACT inhaler 2 puff (has no administration in time range)  dexamethasone (DECADRON) injection 6 mg (has no administration in time range)  guaiFENesin-dextromethorphan (ROBITUSSIN DM) 100-10 MG/5ML syrup 10 mL (has no administration in time range)  ascorbic acid (VITAMIN C) tablet 500 mg (has no administration in time range)  zinc sulfate capsule 220 mg (has no administration in time range)  acetaminophen (TYLENOL) tablet 650 mg (has no administration in time range)  polyethylene glycol (MIRALAX / GLYCOLAX) packet 17 g (has no administration in time range)  ondansetron (ZOFRAN) tablet 4 mg (has no administration in time range)    Or  ondansetron (ZOFRAN) injection 4 mg (has no administration in time range)  iohexol (OMNIPAQUE) 350 MG/ML injection 100 mL (100 mLs Intravenous Contrast Given 03/13/20 1937)    ED Course  I have reviewed the triage vital signs and the nursing notes.  Pertinent labs & imaging results that were available during my care of the patient were reviewed by me and considered in my medical decision making (see chart for details).    MDM Rules/Calculators/A&P                          Nina Pitts is a 59 y.o. female with a past medical history significant for hypertension, fibromyalgia, ureteral bowel syndrome, GERD, fatty liver disease, aortic atherosclerosis, obesity, and prior abdominal hernia status post repair who presents with shortness of breath and hypoxia.  Patient reports that for the last several weeks she has been having URI symptoms with congestion,  rhinorrhea, and sinus problems.  She reports that she completed antibiotics last week when she thought it was a sinus infection however over the last few days has had worsening shortness of breath and cough.  She reports he is also having chest tightness and discomfort with the coughing.  She reports it is pleuritic.  She reports that she went to her PCP for the first time today and was found to have hypoxia and EMS was called to bring her here.  She has not been tested for Covid yet.  She was initially hypoxic in the 80s on arrival and is now on a nonrebreather at 10 L to keep her oxygen saturations in the mid 90s.  She denies nausea, vomiting, constipation, or diarrhea.  She denies any urinary symptoms.  She denies any trauma.  She denies other complaints and feels that her breathing is doing better now that she is on nonrebreather.  She denies history of DVT or PE.  On exam, breath sounds are coarse bilaterally.  No significant wheezing.  No stridor.  Chest and back are nontender.  Abdomen is nontender.  Normal bowel sounds.  Legs are nontender and minimally edematous.  Patient now resting comfortably on nonrebreather.   Clinically I am concerned patient have COVID-19 given the ongoing pandemic.  We will get labs including chest x-ray.  We will get a D-dimer given the hypoxia, tachypnea, and pleuritic chest discomfort.  Due to the new oxygen requirement, anticipate admission after work-up is completed.    6:06 PM D-dimer is elevated, we will get a PE study added on to rule out pulmonary embolism given her hypoxia. Covid test is still in process.  Covid test is positive.  PE study is negative for blood clot.  Patient was admitted for new hypoxia in the setting of COVID-19 infection.  Patient had steroids ordered and remdesivir.  Patient will be admitted for further management.  Final Clinical Impression(s) / ED Diagnoses Final diagnoses:  COVID-19  Hypoxia  Cough  Shortness of breath     Clinical Impression: 1. COVID-19   2. Hypoxia   3. Cough   4. Shortness of breath     Disposition: Admit  This note was prepared with assistance of Dragon voice recognition software. Occasional wrong-word or sound-a-like substitutions may have occurred due to the inherent limitations of voice recognition software.     Mischelle Reeg, Canary Brim, MD 03/13/20 2229

## 2020-03-13 NOTE — H&P (Signed)
History and Physical    Nina Pitts IRC:[id number] DOB: Jan 01, 1961 DOA: 03/13/2020  PCP: Merri Brunette, MD  Patient coming from: Home   Chief Complaint:  Chief Complaint  Patient presents with  . Nasal Congestion  . Cough     HPI:    59 year old female with past medical history of hypothyroidism, hypertension, gastroesophageal reflux disease, hyperlipidemia, irritable bowel syndrome presenting with complaints of shortness of breath and cough.  Patient explains that approximately 4 weeks ago she began to feel like she had a "sinus infection."  After several days of symptoms patient was corresponding with her primary care provider's office.  Initially with the first 2 weeks she was told to take intermittent over-the-counter decongestants remain home drinking plenty of fluids and resting.  However, after approximately 2 weeks of symptoms with development of dry nonproductive cough poor appetite and weakness the patient once again corresponded with her primary care provider office who prescribed her a course of Augmentin.  Patient states that despite taking this regimen of Augmentin she continued to develop progressively worsening cough, mostly dry however occasionally productive with clear sputum.  Patient also developed shortness of breath, initially mild in intensity though progressively becoming more severe and worse with exertion.  Patient denies sick contacts, recent travel or confirmed contact with COVID-19.  Patient finally presented to see her primary care provider who noted that she was hypoxic and sent her to [facility] long emergency department for evaluation.  Upon arrival in the emergency department, patient was confirmed to be hypoxic and placed on supplemental oxygen.  Patient was found to be positive for COVID-19.  CT imaging of the chest revealed diffuse bilateral infiltrates with a incidental finding of substantial emphysema.  No pulmonary embolism was seen.  Patient was  initiated on intravenous remdesivir and dexamethasone.  Due to patient's ongoing substantial oxygen requirement and diagnosis of COVID-19 the hospitalist group was called to assess the patient for admission to the hospital.  Review of Systems:   ROS  Past Medical History:  Diagnosis Date  . Anxiety   . Arthritis    "joints" (02/03/2013)  . Carpal tunnel syndrome   . Fatty liver disease, nonalcoholic dx'd "~ 10/1023"  . Fibromyalgia   . Gallstones   . GERD (gastroesophageal reflux disease)   . Gout attack ~ 2011  . Hypertension   . Hypothyroidism   . IBS (irritable bowel syndrome)   . Obesity   . Osteoarthritis of both knees   . PTSD (post-traumatic stress disorder)     Past Surgical History:  Procedure Laterality Date  . ABDOMINAL EXPLORATION SURGERY  04/27/2001   w/LOA/notes 04/27/2001 (02/03/2013)  . ABDOMINAL HYSTERECTOMY  ~ 2000   partial  . APPENDECTOMY  ~ 1974  . AXILLARY HIDRADENITIS EXCISION Bilateral 1990's   with stsg  . BILATERAL SALPINGOOPHORECTOMY Bilateral 04/27/2001   Daliah Perch 04/27/2001 (02/03/2013)  . CARDIAC CATHETERIZATION  ~ 2005  . CESAREAN SECTION  1979; 1980; 1986  . CHOLECYSTECTOMY  02/03/2013  . CHOLECYSTECTOMY N/A 02/03/2013   Procedure: LAPAROSCOPIC CHOLECYSTECTOMY WITH INTRAOPERATIVE CHOLANGIOGRAM;  Surgeon: [name], MD;  Location: [facility] OR;  Service: General;  Laterality: N/A;  . ERCP N/A 02/09/2013   Procedure: ENDOSCOPIC RETROGRADE CHOLANGIOPANCREATOGRAPHY (ERCP) with Stent Placement;  Surgeon: [name], MD;  Location: [facility] OR;  Service: Endoscopy;  Laterality: N/A;  . ESOPHAGOGASTRODUODENOSCOPY N/A 03/28/2013   Procedure: ESOPHAGOGASTRODUODENOSCOPY (EGD);  Surgeon: [name], MD;  Location: [facility];  Service: Endoscopy;  Laterality: N/A;  . HERNIA REPAIR  ~  1974   [facility]   . INCISIONAL HERNIA REPAIR  10/17/2015  . INCISIONAL HERNIA REPAIR N/A 10/17/2015   Procedure: LAPAROSCOPIC INCISIONAL HERNIA WITH MESH ;  Surgeon: Emelia LoronMatthew  Wakefield, MD;  [facility];  Service: General;  Laterality: N/A;  . INSERTION OF MESH N/A 10/17/2015   Procedure: INSERTION OF MESH;  Surgeon: Emelia LoronMatthew Wakefield, MD;  [facility];  Service: General;  Laterality: N/A;  . KNEE ARTHROSCOPY Right 1990'S  . PILONIDAL CYST EXCISION  2000?     reports that she quit smoking about 21 years ago. Her smoking use included cigarettes. She has a 7.50 pack-year smoking history. She has never used smokeless tobacco. She reports that she does not drink alcohol and does not use drugs.  Allergies  Allergen Reactions  . Percocet [Oxycodone-Acetaminophen] Nausea And Vomiting  . Aspirin Nausea And Vomiting    Adult strength only. Patient stated that she can take lower doses of aspirin  . Chocolate Hives  . Robaxin [Methocarbamol] Itching  . Vicodin [Hydrocodone-Acetaminophen] Itching  . Tramadol Other (See Comments)    Has bad headaches after taking    Family History  Problem Relation Age of Onset  . Pancreatic cancer Father   . Esophageal cancer Father   . Cancer Father        esophagus, lungs  . Diabetes Mother   . Heart disease Mother   . Cancer Paternal Grandmother        breast     Prior to Admission medications   Medication Sig Start Date End Date Taking? Authorizing Provider  acetaminophen (TYLENOL) 500 MG tablet Take 1,000 mg by mouth every 6 (six) hours as needed for moderate pain or headache.   Yes [provider]  aspirin 81 MG chewable tablet Chew 81 mg by mouth daily.   Yes [provider]  atorvastatin (LIPITOR) 20 MG tablet Take 20 mg by mouth daily.  02/16/13  Yes [provider]  cholecalciferol (VITAMIN D3) 25 MCG (1000 UNIT) tablet Take 1,000 Units by mouth daily.   Yes [provider]  clonazePAM (KLONOPIN) 0.5 MG tablet Take 0.25-0.5 mg by mouth 2 (two) times daily as needed for anxiety.    Yes [provider]  ibuprofen (ADVIL,MOTRIN) 800 MG tablet Take 1 tablet (800 mg total)  by mouth 3 (three) times daily. Patient taking differently: Take 800 mg by mouth 3 (three) times daily as needed.  05/12/18  Yes Mardella LaymanHagler, Brian, MD  levothyroxine (SYNTHROID, LEVOTHROID) 150 MCG tablet Take 150 mcg by mouth daily before breakfast. Saturday &Sunday   Yes [provider]  levothyroxine (SYNTHROID, LEVOTHROID) 175 MCG tablet Take 175 mcg by mouth See admin instructions. Monday through Friday   Yes [provider]  losartan (COZAAR) 50 MG tablet Take 50 mg by mouth daily.   Yes [provider]  metaxalone (SKELAXIN) 800 MG tablet Take 800 mg by mouth 3 (three) times daily as needed for pain.    Yes [provider]  diphenhydrAMINE (BENADRYL) 25 MG tablet Take 1 tablet (25 mg total) by mouth every 6 (six) hours as needed for itching (Rash). Patient not taking: Reported on 03/13/2020 12/09/16   Muthersbaugh, Dahlia ClientHannah, PA-C  docusate sodium (COLACE) 100 MG capsule Take 1 capsule (100 mg total) by mouth 2 (two) times daily. Patient not taking: Reported on 03/13/2020 10/18/15   Emelia LoronWakefield, Matthew, MD  Multiple Vitamin (MULTIVITAMIN) tablet Take 1 tablet by mouth daily. Patient not taking: Reported on 03/13/2020    [provider]  naproxen (NAPROSYN) 500 MG tablet Take 1 tablet (500 mg total) by mouth 2 (two) times daily. Patient not taking: Reported on 03/13/2020 02/04/16   Felicie Morn, NP  olopatadine (PATANOL) 0.1 % ophthalmic solution Place 1 drop into both eyes 2 (two) times daily. Patient not taking: Reported on 03/13/2020 12/24/15   Domenick Gong, MD  pantoprazole (PROTONIX) 40 MG tablet Take 40 mg by mouth daily.    [provider]  promethazine (PHENERGAN) 12.5 MG tablet Take 1 tablet (12.5 mg total) by mouth every 6 (six) hours as needed for nausea or vomiting. Patient not taking: Reported on 03/13/2020 10/18/15   [name], MD  tiZANidine (ZANAFLEX) 4 MG tablet Take 4 mg by mouth every 6 (six) hours as needed for muscle spasms.     [provider]    Physical Exam: Vitals:   03/13/20 1216 03/13/20 2015 03/13/20 2046 03/13/20 2137  BP: (!) 178/111 (!) 158/84 (!) 146/82 (!) 159/75  Pulse: 88 (!) 55 (!) 55 62  Resp: 16 (!) 26 (!) 25 (!) 25  Temp: 98.2 F (36.8 C)     TempSrc: Oral     SpO2: (!) 88% 99% 97% 99%    Constitutional: Acute alert and oriented x3, patient, in mild respiratory distress. Skin: no rashes, no lesions, poor skin turgor noted. Eyes: Pupils are equally reactive to light.  No evidence of scleral icterus or conjunctival pallor.  ENMT: Dry mucous membranes noted.  Posterior pharynx clear of any exudate or lesions.   Neck: normal, supple, no masses, no thyromegaly.  No evidence of jugular venous distension.   Respiratory: Significant bilateral mid and lower field rales noted without evidence of significant wheezing.  Patient is found to be tachypneic without accessory muscle use.  Cardiovascular: Regular rate and rhythm, no murmurs / rubs / gallops. No extremity edema. 2+ pedal pulses. No carotid bruits.  Chest:   Nontender without crepitus or deformity.   Back:   Nontender without crepitus or deformity. Abdomen: Abdomen is soft and nontender.  No evidence of intra-abdominal masses.  Positive bowel sounds noted in all quadrants.   Musculoskeletal: No joint deformity upper and lower extremities. Good ROM, no contractures. Normal muscle tone.  Neurologic: CN 2-12 grossly intact. Sensation intact.  Patient moving all 4 extremities spontaneously.  Patient is following all commands.  Patient is responsive to verbal stimuli.   Psychiatric: Patient exhibits normal mood with appropriate affect.  Patient seems to possess insight as to their current situation.     Labs on Admission: I have personally reviewed following labs and imaging studies -   CBC: Recent Labs  Lab 03/13/20 1632  WBC 8.9  NEUTROABS 6.5  HGB 13.6  HCT 43.1  MCV 96.2  PLT 323   Basic Metabolic Panel: Recent Labs    Lab 03/13/20 1632  NA 140  K 3.6  CL 105  CO2 27  GLUCOSE 133*  BUN 8  CREATININE 0.72  CALCIUM 8.8*   GFR: CrCl cannot be calculated (Unknown ideal weight.). Liver Function Tests: Recent Labs  Lab 03/13/20 1632  AST 16  ALT 13  ALKPHOS 60  BILITOT 0.5  PROT 8.1  ALBUMIN 3.1*   No results for input(s): LIPASE, AMYLASE in the last 168 hours. No results for input(s): AMMONIA in the last 168 hours. Coagulation Profile: No results for input(s): INR, PROTIME in the last 168 hours. Cardiac Enzymes: No results for input(s): CKTOTAL, CKMB, CKMBINDEX, TROPONINI in the last 168 hours. BNP (  last 3 results) No results for input(s): PROBNP in the last 8760 hours. HbA1C: No results for input(s): HGBA1C in the last 72 hours. CBG: No results for input(s): GLUCAP in the last 168 hours. Lipid Profile: No results for input(s): CHOL, HDL, LDLCALC, TRIG, CHOLHDL, LDLDIRECT in the last 72 hours. Thyroid Function Tests: No results for input(s): TSH, T4TOTAL, FREET4, T3FREE, THYROIDAB in the last 72 hours. Anemia Panel: No results for input(s): VITAMINB12, FOLATE, FERRITIN, TIBC, IRON, RETICCTPCT in the last 72 hours. Urine analysis:    Component Value Date/Time   COLORURINE YELLOW 02/07/2013 2300   APPEARANCEUR CLEAR 02/07/2013 2300   LABSPEC 1.013 02/07/2013 2300   PHURINE 8.0 02/07/2013 2300   GLUCOSEU NEGATIVE 02/07/2013 2300   HGBUR NEGATIVE 02/07/2013 2300   BILIRUBINUR NEGATIVE 02/07/2013 2300   KETONESUR NEGATIVE 02/07/2013 2300   PROTEINUR NEGATIVE 02/07/2013 2300   UROBILINOGEN 1.0 02/07/2013 2300   NITRITE NEGATIVE 02/07/2013 2300   LEUKOCYTESUR NEGATIVE 02/07/2013 2300    Radiological Exams on Admission - Personally Reviewed: CT Angio Chest PE W and/or Wo Contrast  Result Date: 03/13/2020 CLINICAL DATA:  Shortness of breath with congestion and cough. EXAM: CT ANGIOGRAPHY CHEST WITH CONTRAST TECHNIQUE: Multidetector CT imaging of the chest was performed using the  standard protocol during bolus administration of intravenous contrast. Multiplanar CT image reconstructions and MIPs were obtained to evaluate the vascular anatomy. CONTRAST:  OMNIPAQUE IOHEXOL 350 MG/ML SOLN COMPARISON:  None. FINDINGS: Cardiovascular: Satisfactory opacification of the pulmonary arteries to the segmental level. No evidence of pulmonary embolism. There is mild cardiomegaly. No pericardial effusion. Mediastinum/Nodes: There is mild mediastinal and bilateral hilar lymphadenopathy. The trachea and esophagus demonstrate no significant findings. Lungs/Pleura: There is marked severity emphysematous lung disease. Marked severity, predominately peripheral multifocal infiltrates are seen, bilaterally. There is a small left pleural effusion. No pneumothorax is identified Upper Abdomen: Multiple surgical clips are seen within the gallbladder fossa. Noninflamed diverticula are seen along the splenic flexure. Musculoskeletal: No chest wall abnormality. No acute or significant osseous findings. Review of the MIP images confirms the above findings. IMPRESSION: 1. Marked severity, predominately peripheral bilateral multifocal infiltrates. 2. Small left pleural effusion. 3. Marked severity emphysematous lung disease. 4. Colonic diverticulosis. Emphysema (ICD10-J43.9). Electronically Signed   By: Aram Candela M.D.   On: 03/13/2020 20:25   DG Chest Portable 1 View  Result Date: 03/13/2020 CLINICAL DATA:  Cough and congestion EXAM: PORTABLE CHEST 1 VIEW COMPARISON:  February 08, 2013. FINDINGS: There is multifocal airspace opacity, primarily in the mid and lower lung regions. There is mild consolidation in the right base. Heart is upper normal in size with pulmonary vascularity normal. No adenopathy. No bone lesions. IMPRESSION: Multifocal airspace opacity. Appearance most consistent with multifocal pneumonia. Atypical organism pneumonia may present in this manner. Given this appearance, correlation with  COVID-19 status advised. Heart size is upper normal.  No adenopathy evident. Electronically Signed   By: Bretta Bang III M.D.   On: 03/13/2020 15:17    EKG: Personally reviewed.  Rhythm is normal sinus rhythm with heart rate of 83 bpm.  Notable T wave inversions in the anterior septal and lateral leads.    Assessment/Plan Principal Problem:   COVID-19 virus infection   Patient presenting with several weeks of progressively worsening dyspnea on exertion and cough.    Patient observed to be quite hypoxic requiring between 8 and 10 L of oxygen in the emergency department.  Patient had to be COVID-19 positive significant bilateral infiltrates  Degree of  hypoxia is likely complicated by concurrent incidental finding of significant emphysema.  Will titrate supplemental oxygen to achieve saturations of 88 to 92%, lower than usual 94% due to the presence of substantial emphysema  We will continue regimen of remdesivir and dexamethasone  Bronchodilator therapy via albuterol MDI  Hydrating patient with intravenous isotonic fluids  If patient clinically worsens will consider initiation of tocilizumab or baricitinib  Admitting to COVID-19 unit  Active Problems:   Acute respiratory failure with hypoxia (HCC)   Please see assessment and plan above    Hypothyroidism   Continue home regimen of Synthroid    Essential hypertension   Continue home regimen of antihypertensive therapy  Will provide additional intravenous antihypertensives for excessively elevated blood pressures.    Emphysema of lung (HCC)   Substantial emphysema of the lungs is an incidental finding on CT chest, no prior CT images available  Patient reports an approximate 12-year history of smoking however she quit approximately 20 years ago.  Patient would likely benefit from initiation of maintenance or at least a as needed bronchodilator at time of discharge as well as follow-up with pulmonology   GERD  without esophagitis  . Continuing home regimen of daily PPI therapy.    Mixed hyperlipidemia   . Continuing home regimen of lipid lowering therapy.    Code Status:  Full code Family Communication: deferred   Status is: Inpatient  Remains inpatient appropriate because:IV treatments appropriate due to intensity of illness or inability to take PO and Inpatient level of care appropriate due to severity of illness   Dispo: The patient is from: Home              Anticipated d/c is to: Home              Anticipated d/c date is: > 3 days              Patient currently is not medically stable to d/c.        Marinda Elk MD [facility] Pager [phone]  If 7PM-7AM, please contact night-coverage [email or web address] Use universal [place] password for that web site. If you do not have the password, please call the hospital operator.  03/13/2020, 10:21 PM

## 2020-03-14 ENCOUNTER — Inpatient Hospital Stay (HOSPITAL_COMMUNITY): Payer: Medicare Other

## 2020-03-14 DIAGNOSIS — R0602 Shortness of breath: Secondary | ICD-10-CM

## 2020-03-14 DIAGNOSIS — R7989 Other specified abnormal findings of blood chemistry: Secondary | ICD-10-CM

## 2020-03-14 LAB — COMPREHENSIVE METABOLIC PANEL
ALT: 14 U/L (ref 0–44)
AST: 15 U/L (ref 15–41)
Albumin: 3 g/dL — ABNORMAL LOW (ref 3.5–5.0)
Alkaline Phosphatase: 62 U/L (ref 38–126)
Anion gap: 11 (ref 5–15)
BUN: 6 mg/dL (ref 6–20)
CO2: 27 mmol/L (ref 22–32)
Calcium: 9.2 mg/dL (ref 8.9–10.3)
Chloride: 106 mmol/L (ref 98–111)
Creatinine, Ser: 0.63 mg/dL (ref 0.44–1.00)
GFR calc Af Amer: >60 mL/min (ref >60)
GFR calc non Af Amer: >60 mL/min (ref >60)
Glucose, Bld: 114 mg/dL — ABNORMAL HIGH (ref 70–99)
Potassium: 4.4 mmol/L (ref 3.5–5.1)
Sodium: 144 mmol/L (ref 135–145)
Total Bilirubin: 0.2 mg/dL — ABNORMAL LOW (ref 0.3–1.2)
Total Protein: 8.1 g/dL (ref 6.5–8.1)

## 2020-03-14 LAB — CBC WITH DIFFERENTIAL/PLATELET
Abs Immature Granulocytes: 0.02 10*3/uL (ref 0.00–0.07)
Basophils Absolute: 0 10*3/uL (ref 0.0–0.1)
Basophils Relative: 0 %
Eosinophils Absolute: 0.1 10*3/uL (ref 0.0–0.5)
Eosinophils Relative: 1 %
HCT: 42.4 % (ref 36.0–46.0)
Hemoglobin: 13.4 g/dL (ref 12.0–15.0)
Immature Granulocytes: 0 %
Lymphocytes Relative: 15 %
Lymphs Abs: 1 10*3/uL (ref 0.7–4.0)
MCH: 30.3 pg (ref 26.0–34.0)
MCHC: 31.6 g/dL (ref 30.0–36.0)
MCV: 95.9 fL (ref 80.0–100.0)
Monocytes Absolute: 0.2 10*3/uL (ref 0.1–1.0)
Monocytes Relative: 3 %
Neutro Abs: 5.2 10*3/uL (ref 1.7–7.7)
Neutrophils Relative %: 81 %
Platelets: 304 10*3/uL (ref 150–400)
RBC: 4.42 MIL/uL (ref 3.87–5.11)
RDW: 14.9 % (ref 11.5–15.5)
WBC: 6.5 10*3/uL (ref 4.0–10.5)
nRBC: 0 % (ref 0.0–0.2)

## 2020-03-14 LAB — MAGNESIUM: Magnesium: 2.2 mg/dL (ref 1.7–2.4)

## 2020-03-14 LAB — PROCALCITONIN
Procalcitonin: <0.1 ng/mL
Procalcitonin: <0.1 ng/mL

## 2020-03-14 LAB — D-DIMER, QUANTITATIVE: D-Dimer, Quant: 2.08 ug/mL-FEU — ABNORMAL HIGH (ref 0.00–0.50)

## 2020-03-14 LAB — C-REACTIVE PROTEIN: CRP: 2.8 mg/dL — ABNORMAL HIGH (ref <1.0)

## 2020-03-14 LAB — HIV ANTIBODY (ROUTINE TESTING W REFLEX): HIV Screen 4th Generation wRfx: NONREACTIVE

## 2020-03-14 MED ORDER — METHYLPREDNISOLONE SODIUM SUCC 125 MG IJ SOLR
60.0000 mg | Freq: Two times a day (BID) | INTRAMUSCULAR | Status: DC
  Administered 2020-03-14 – 2020-03-20 (×13): 60 mg via INTRAVENOUS
  Filled 2020-03-14 (×13): qty 2

## 2020-03-14 NOTE — Progress Notes (Signed)
Lower extremity venous bilateral study completed.   Please see CV Proc for preliminary results.   Rashema Seawright, RDMS  

## 2020-03-14 NOTE — Progress Notes (Signed)
Patient arrives to room 1529 at this time via stretcher from ED.  Patient oriented to use of callbell and bed controls with stated understanding

## 2020-03-14 NOTE — Progress Notes (Addendum)
PROGRESS NOTE    Nina Pitts  BHA:[id number] DOB: [birth year] DOA: 03/13/2020 PCP: Merri Brunette, MD    Brief Narrative:  59 year old with history of hypothyroidism, hypertension, GERD and hyperlipidemia presented with shortness of breath and cough almost for 4 weeks. Seen at primary care physician's office and sent to ER with hypoxemia. In the emergency room positive for COVID-19. CT scan without PE, bilateral infiltrates with incidental finding of substantial emphysematous lungs.   Assessment & Plan:   Principal Problem:   COVID-19 virus infection Active Problems:   Hypothyroidism   GERD without esophagitis   Essential hypertension   Mixed hyperlipidemia   Acute respiratory failure with hypoxia (HCC)   Emphysema of lung (HCC)  Acute hypoxemic respiratory failure due to COVID-19 pneumonia: Continue to monitor due to significant symptoms  chest physiotherapy, incentive spirometry, deep breathing exercises, sputum induction, mucolytic's and bronchodilators. Supplemental oxygen to keep saturations more than 90%. Covid directed therapy with , steroids, on dexamethasone, will change to Solu-Medrol. remdesivir, day 2/5 Due to severity of symptoms, patient will need daily inflammatory markers, chest x-rays, liver function test to monitor and direct COVID-19 therapies. D-dimer is elevated.  CTA of the chest was negative for pulmonary embolism. We will check duplexes of the lower extremities.  Recheck D-dimer tomorrow morning.  SpO2: 93 % O2 Flow Rate (L/min): 6 L/min  COVID-19 Labs  Recent Labs    03/13/20 1632 03/14/20 0330  DDIMER 2.31* 2.08*  CRP  --  2.8*    Lab Results  Component Value Date   SARSCOV2NAA POSITIVE (A) 03/13/2020   Hypothyroidism: On Synthroid.  Hypertension: Blood pressures elevated. Resume home medications including losartan.  Chronic lung disease: History of smoking. May interfere with oxygen requirement on discharge.  Hyperlipidemia: On  statin that she will continue.  DVT prophylaxis: Lovenox subcu   Code Status: Full code. Family Communication: Patient talking to family. Disposition Plan: Status is: Inpatient  Remains inpatient appropriate because:Inpatient level of care appropriate due to severity of illness   Dispo: The patient is from: Home              Anticipated d/c is to: Home              Anticipated d/c date is: 3 days              Patient currently is not medically stable to d/c.         Consultants:  None Procedures:   None  Antimicrobials:   Remdesivir, day 2/5   Subjective: Patient seen and examined.  She is still in the emergency room.  At rest she feels okay.  She could not tolerate any activities.  Currently on 6 L of oxygen.  Objective: Vitals:   03/14/20 0830 03/14/20 0845 03/14/20 1200 03/14/20 1503  BP: (!) 163/89  (!) 174/95 (!) 154/95  Pulse: 61 68 84 78  Resp: 18  18 18   Temp:    98.5 F (36.9 C)  TempSrc:    Oral  SpO2: 95% 97% 92% 93%    Intake/Output Summary (Last 24 hours) at 03/14/2020 1512 Last data filed at 03/14/2020 0305 Gross per 24 hour  Intake 250 ml  Output --  Net 250 ml   There were no vitals filed for this visit.  Examination:  General exam: Looks fairly comfortable at rest on 6 L oxygen. Respiratory system: Clear to auscultation. Respiratory effort normal.  Poor bilateral air entry. Cardiovascular system: S1 & S2 heard, RRR. No JVD,  murmurs, rubs, gallops or clicks. No pedal edema. Gastrointestinal system: Abdomen is nondistended, soft and nontender. No organomegaly or masses felt. Normal bowel sounds heard. Central nervous system: Alert and oriented. No focal neurological deficits. Extremities: Symmetric 5 x 5 power. Skin: No rashes, lesions or ulcers Psychiatry: Judgement and insight appear normal. Mood & affect appropriate.     Data Reviewed: I have personally reviewed following labs and imaging studies  CBC: Recent Labs  Lab  03/13/20 1632 03/14/20 0330  WBC 8.9 6.5  NEUTROABS 6.5 5.2  HGB 13.6 13.4  HCT 43.1 42.4  MCV 96.2 95.9  PLT 323 304   Basic Metabolic Panel: Recent Labs  Lab 03/13/20 1632 03/14/20 0330  NA 140 144  K 3.6 4.4  CL 105 106  CO2 27 27  GLUCOSE 133* 114*  BUN 8 6  CREATININE 0.72 0.63  CALCIUM 8.8* 9.2  MG  --  2.2   GFR: CrCl cannot be calculated (Unknown ideal weight.). Liver Function Tests: Recent Labs  Lab 03/13/20 1632 03/14/20 0330  AST 16 15  ALT 13 14  ALKPHOS 60 62  BILITOT 0.5 0.2*  PROT 8.1 8.1  ALBUMIN 3.1* 3.0*   No results for input(s): LIPASE, AMYLASE in the last 168 hours. No results for input(s): AMMONIA in the last 168 hours. Coagulation Profile: No results for input(s): INR, PROTIME in the last 168 hours. Cardiac Enzymes: No results for input(s): CKTOTAL, CKMB, CKMBINDEX, TROPONINI in the last 168 hours. BNP (last 3 results) No results for input(s): PROBNP in the last 8760 hours. HbA1C: No results for input(s): HGBA1C in the last 72 hours. CBG: No results for input(s): GLUCAP in the last 168 hours. Lipid Profile: No results for input(s): CHOL, HDL, LDLCALC, TRIG, CHOLHDL, LDLDIRECT in the last 72 hours. Thyroid Function Tests: No results for input(s): TSH, T4TOTAL, FREET4, T3FREE, THYROIDAB in the last 72 hours. Anemia Panel: No results for input(s): VITAMINB12, FOLATE, FERRITIN, TIBC, IRON, RETICCTPCT in the last 72 hours. Sepsis Labs: Recent Labs  Lab 03/13/20 1632 03/13/20 2015 03/14/20 0330  PROCALCITON  --  <0.10 <0.10  LATICACIDVEN 1.5  --   --     Recent Results (from the past 240 hour(s))  Respiratory Panel by RT PCR (Flu A&B, Covid) - Nasopharyngeal Swab     Status: Abnormal   Collection Time: 03/13/20  4:32 PM   Specimen: Nasopharyngeal Swab  Result Value Ref Range Status   SARS Coronavirus 2 by RT PCR POSITIVE (A) NEGATIVE Final    Comment: RESULT CALLED TO, READ BACK BY AND VERIFIED WITH: S.WEST AT 1830 ON 03/13/20  BY N.THOMPSON (NOTE) SARS-CoV-2 target nucleic acids are DETECTED.  SARS-CoV-2 RNA is generally detectable in upper respiratory specimens  during the acute phase of infection. Positive results are indicative of the presence of the identified virus, but do not rule out bacterial infection or co-infection with other pathogens not detected by the test. Clinical correlation with patient history and other diagnostic information is necessary to determine patient infection status. The expected result is Negative.  Fact Sheet for Patients:  https://www.moore.com/https://www.fda.gov/media/142436/download  Fact Sheet for Healthcare Providers: https://www.young.biz/https://www.fda.gov/media/142435/download  This test is not yet approved or cleared by the [place] FDA and  has been authorized for detection and/or diagnosis of SARS-CoV-2 by FDA under an Emergency Use Authorization (EUA).  This EUA will remain in effect (meaning this test ca n be used) for the duration of  the COVID-19 declaration under Section 564(b)(1) of the Act, 21 U.S.C. section  360bbb-3(b)(1), unless the authorization is terminated or revoked sooner.      Influenza A by PCR NEGATIVE NEGATIVE Final   Influenza B by PCR NEGATIVE NEGATIVE Final    Comment: (NOTE) The Xpert Xpress SARS-CoV-2/FLU/RSV assay is intended as an aid in  the diagnosis of influenza from Nasopharyngeal swab specimens and  should not be used as a sole basis for treatment. Nasal washings and  aspirates are unacceptable for Xpert Xpress SARS-CoV-2/FLU/RSV  testing.  Fact Sheet for Patients: https://www.moore.com/  Fact Sheet for Healthcare Providers: https://www.young.biz/  This test is not yet approved or cleared by the [place] FDA and  has been authorized for detection and/or diagnosis of SARS-CoV-2 by  FDA under an Emergency Use Authorization (EUA). This EUA will remain  in effect (meaning this test can be used) for the duration of the   Covid-19 declaration under Section 564(b)(1) of the Act, 21  U.S.C. section 360bbb-3(b)(1), unless the authorization is  terminated or revoked. Performed at [facility], [address] [place], [place] [address]          Radiology Studies: CT Angio Chest PE W and/or Wo Contrast  Result Date: 03/13/2020 CLINICAL DATA:  Shortness of breath with congestion and cough. EXAM: CT ANGIOGRAPHY CHEST WITH CONTRAST TECHNIQUE: Multidetector CT imaging of the chest was performed using the standard protocol during bolus administration of intravenous contrast. Multiplanar CT image reconstructions and MIPs were obtained to evaluate the vascular anatomy. CONTRAST:  OMNIPAQUE IOHEXOL 350 MG/ML SOLN COMPARISON:  None. FINDINGS: Cardiovascular: Satisfactory opacification of the pulmonary arteries to the segmental level. No evidence of pulmonary embolism. There is mild cardiomegaly. No pericardial effusion. Mediastinum/Nodes: There is mild mediastinal and bilateral hilar lymphadenopathy. The trachea and esophagus demonstrate no significant findings. Lungs/Pleura: There is marked severity emphysematous lung disease. Marked severity, predominately peripheral multifocal infiltrates are seen, bilaterally. There is a small left pleural effusion. No pneumothorax is identified Upper Abdomen: Multiple surgical clips are seen within the gallbladder fossa. Noninflamed diverticula are seen along the splenic flexure. Musculoskeletal: No chest wall abnormality. No acute or significant osseous findings. Review of the MIP images confirms the above findings. IMPRESSION: 1. Marked severity, predominately peripheral bilateral multifocal infiltrates. 2. Small left pleural effusion. 3. Marked severity emphysematous lung disease. 4. Colonic diverticulosis. Emphysema (ICD10-J43.9). Electronically Signed   By: Aram Candela M.D.   On: 03/13/2020 20:25   DG Chest Portable 1 View  Result Date:  03/13/2020 CLINICAL DATA:  Cough and congestion EXAM: PORTABLE CHEST 1 VIEW COMPARISON:  February 08, 2013. FINDINGS: There is multifocal airspace opacity, primarily in the mid and lower lung regions. There is mild consolidation in the right base. Heart is upper normal in size with pulmonary vascularity normal. No adenopathy. No bone lesions. IMPRESSION: Multifocal airspace opacity. Appearance most consistent with multifocal pneumonia. Atypical organism pneumonia may present in this manner. Given this appearance, correlation with COVID-19 status advised. Heart size is upper normal.  No adenopathy evident. Electronically Signed   By: Bretta Bang III M.D.   On: 03/13/2020 15:17        Scheduled Meds: . vitamin C  500 mg Oral Daily  . aspirin  81 mg Oral Daily  . atorvastatin  20 mg Oral Daily  . cholecalciferol  1,000 Units Oral Daily  . enoxaparin (LOVENOX) injection  50 mg Subcutaneous Q24H  . [START ON 03/17/2020] levothyroxine  150 mcg Oral Once per day on Sun Sat  . levothyroxine  175 mcg Oral Once  per day on Mon Tue Wed Thu Fri  . losartan  50 mg Oral Daily  . methylPREDNISolone (SOLU-MEDROL) injection  60 mg Intravenous Q12H  . pantoprazole  40 mg Oral Daily  . zinc sulfate  220 mg Oral Daily   Continuous Infusions: . remdesivir 100 mg in NS 100 mL 100 mg (03/14/20 1053)     LOS: 1 day    Time spent: 35 minutes .    Dorcas Carrow, MD [facility] Pager [phone]

## 2020-03-14 NOTE — Progress Notes (Signed)
Pt is on O2 via n/c at 6L/min, Pulse ox-91%

## 2020-03-15 LAB — COMPREHENSIVE METABOLIC PANEL
ALT: 14 U/L (ref 0–44)
AST: 13 U/L — ABNORMAL LOW (ref 15–41)
Albumin: 3.1 g/dL — ABNORMAL LOW (ref 3.5–5.0)
Alkaline Phosphatase: 59 U/L (ref 38–126)
Anion gap: 10 (ref 5–15)
BUN: 12 mg/dL (ref 6–20)
CO2: 24 mmol/L (ref 22–32)
Calcium: 9.4 mg/dL (ref 8.9–10.3)
Chloride: 107 mmol/L (ref 98–111)
Creatinine, Ser: 0.64 mg/dL (ref 0.44–1.00)
GFR calc Af Amer: >60 mL/min (ref >60)
GFR calc non Af Amer: >60 mL/min (ref >60)
Glucose, Bld: 120 mg/dL — ABNORMAL HIGH (ref 70–99)
Potassium: 4.2 mmol/L (ref 3.5–5.1)
Sodium: 141 mmol/L (ref 135–145)
Total Bilirubin: 0.6 mg/dL (ref 0.3–1.2)
Total Protein: 7.7 g/dL (ref 6.5–8.1)

## 2020-03-15 LAB — CBC WITH DIFFERENTIAL/PLATELET
Abs Immature Granulocytes: 0.06 10*3/uL (ref 0.00–0.07)
Basophils Absolute: 0 10*3/uL (ref 0.0–0.1)
Basophils Relative: 0 %
Eosinophils Absolute: 0 10*3/uL (ref 0.0–0.5)
Eosinophils Relative: 0 %
HCT: 44.5 % (ref 36.0–46.0)
Hemoglobin: 13.9 g/dL (ref 12.0–15.0)
Immature Granulocytes: 1 %
Lymphocytes Relative: 16 %
Lymphs Abs: 1.6 10*3/uL (ref 0.7–4.0)
MCH: 30.4 pg (ref 26.0–34.0)
MCHC: 31.2 g/dL (ref 30.0–36.0)
MCV: 97.4 fL (ref 80.0–100.0)
Monocytes Absolute: 0.6 10*3/uL (ref 0.1–1.0)
Monocytes Relative: 6 %
Neutro Abs: 8 10*3/uL — ABNORMAL HIGH (ref 1.7–7.7)
Neutrophils Relative %: 77 %
Platelets: 312 10*3/uL (ref 150–400)
RBC: 4.57 MIL/uL (ref 3.87–5.11)
RDW: 15.5 % (ref 11.5–15.5)
WBC: 10.2 10*3/uL (ref 4.0–10.5)
nRBC: 0 % (ref 0.0–0.2)

## 2020-03-15 LAB — C-REACTIVE PROTEIN: CRP: 1.5 mg/dL — ABNORMAL HIGH (ref <1.0)

## 2020-03-15 LAB — MAGNESIUM: Magnesium: 2.3 mg/dL (ref 1.7–2.4)

## 2020-03-15 LAB — D-DIMER, QUANTITATIVE: D-Dimer, Quant: 1.44 ug/mL-FEU — ABNORMAL HIGH (ref 0.00–0.50)

## 2020-03-15 LAB — PROCALCITONIN: Procalcitonin: <0.1 ng/mL

## 2020-03-15 MED ORDER — ENSURE ENLIVE PO LIQD
237.0000 mL | Freq: Two times a day (BID) | ORAL | Status: DC
  Administered 2020-03-16 – 2020-03-20 (×8): 237 mL via ORAL

## 2020-03-15 NOTE — Evaluation (Addendum)
Physical Therapy Evaluation Patient Details Name: Nina Pitts MRN: [id number] DOB: March 02, 1961 Today's Date: 03/15/2020   History of Present Illness  59 year old female with past medical history of hypothyroidism, hypertension, gastroesophageal reflux disease, hyperlipidemia, irritable bowel syndrome presenting with complaints of shortness of breath and cough. Dx of covid.  Clinical Impression  Pt admitted with above diagnosis. Pt ambulated 90' x 2 with standing rest break, SaO2 83% on 6L O2 [place] while walking, HR 120s. VCs for pursed lip breathing. Instructed pt in LE strengthening exercises to be done independently in room. Pt reports she's using incentive spirometer multiple times/day. Encouraged pt to lie prone or side lying when in bed.  Pt currently with functional limitations due to the deficits listed below (see PT Problem List). Pt will benefit from skilled PT to increase their independence and safety with mobility to allow discharge to the venue listed below.       Follow Up Recommendations No PT follow up    Equipment Recommendations  Other (comment) (home O2)    Recommendations for Other Services       Precautions / Restrictions Precautions Precautions: Other (comment) Precaution Comments: monitor O2 Restrictions Weight Bearing Restrictions: No      Mobility  Bed Mobility               General bed mobility comments: up in recliner  Transfers Overall transfer level: Independent                  Ambulation/Gait Ambulation/Gait assistance: Supervision Gait Distance (Feet): 90 Feet Assistive device: None Gait Pattern/deviations: WFL(Within Functional Limits) Gait velocity: decr   General Gait Details: 90' x 2 with 2 minute standing rest break, SaO2 83% on 6L O2 [place], VCs pursed lip breathing  Stairs            Wheelchair Mobility    Modified Rankin (Stroke Patients Only)       Balance Overall balance assessment: Independent                                            Pertinent Vitals/Pain Pain Assessment: No/denies pain    Home Living Family/patient expects to be discharged to:: Private residence Living Arrangements: Parent;Spouse/significant other Available Help at Discharge: Family Type of Home: House Home Access: Stairs to enter   Secretary/administrator of Steps: 1 Home Layout: One level Home Equipment: Environmental consultant - 2 wheels;Bedside commode      Prior Function Level of Independence: Independent         Comments: cares for mother who has cancer, spouse works     Higher education careers adviser        Extremity/Trunk Assessment   Upper Extremity Assessment Upper Extremity Assessment: Overall WFL for tasks assessed    Lower Extremity Assessment Lower Extremity Assessment: Overall WFL for tasks assessed    Cervical / Trunk Assessment Cervical / Trunk Assessment: Normal  Communication   Communication: No difficulties  Cognition Arousal/Alertness: Awake/alert Behavior During Therapy: WFL for tasks assessed/performed Overall Cognitive Status: Within Functional Limits for tasks assessed                                        General Comments      Exercises General Exercises - Lower Extremity Ankle Circles/Pumps: AROM;Both;10 reps;Seated Long  Arc Quad: AROM;Both;10 reps;Seated Hip Flexion/Marching: AROM;Both;10 reps;Seated   Assessment/Plan    PT Assessment Patient needs continued PT services  PT Problem List Cardiopulmonary status limiting activity;Decreased activity tolerance       PT Treatment Interventions Therapeutic activities;Therapeutic exercise;Gait training;Patient/family education    PT Goals (Current goals can be found in the Care Plan section)  Acute Rehab PT Goals Patient Stated Goal: care for her mother who has cancer PT Goal Formulation: With patient Time For Goal Achievement: 03/29/20 Potential to Achieve Goals: Good    Frequency Min 3X/week    Barriers to discharge        Co-evaluation               AM-PAC PT "6 Clicks" Mobility  Outcome Measure Help needed turning from your back to your side while in a flat bed without using bedrails?: None Help needed moving from lying on your back to sitting on the side of a flat bed without using bedrails?: None Help needed moving to and from a bed to a chair (including a wheelchair)?: None Help needed standing up from a chair using your arms (e.g., wheelchair or bedside chair)?: None Help needed to walk in hospital room?: None Help needed climbing 3-5 steps with a railing? : A Little 6 Click Score: 23    End of Session Equipment Utilized During Treatment: Gait belt;Oxygen Activity Tolerance: Patient tolerated treatment well Patient left: in chair;with call bell/phone within reach Nurse Communication: Mobility status PT Visit Diagnosis: Difficulty in walking, not elsewhere classified (R26.2)    Time: 0867-6195 PT Time Calculation (min) (ACUTE ONLY): 47 min   Charges:   PT Evaluation $PT Eval Low Complexity: 1 Low PT Treatments $Gait Training: 8-22 mins $Therapeutic Activity: 8-22 mins       Ralene Bathe Kistler PT 03/15/2020  Acute Rehabilitation Services Pager [phone] Office [phone]

## 2020-03-15 NOTE — Progress Notes (Signed)
Initial Nutrition Assessment  DOCUMENTATION CODES:   Obesity unspecified  INTERVENTION:   -Ensure Enlive po BID, each supplement provides 350 kcal and 20 grams of protein  NUTRITION DIAGNOSIS:   Increased nutrient needs related to acute illness (COVID-19 infection) as evidenced by estimated needs.  GOAL:   Patient will meet greater than or equal to 90% of their needs  MONITOR:   PO intake, Supplement acceptance, Labs, Weight trends, I & O's  REASON FOR ASSESSMENT:   Malnutrition Screening Tool    ASSESSMENT:   59 year old with history of hypothyroidism, hypertension, GERD and hyperlipidemia presented with shortness of breath and cough almost for 4 weeks. Seen at primary care physician's office and sent to ER with hypoxemia.In the emergency room positive for COVID-19. CT scan without PE, bilateral infiltrates with incidental finding of substantial emphysematous lungs.  Patient has been positive for COVID-19 since 9/28. Pt consuming 45-75% of meals today. Given increased needs from COVID-19 infection, will order Ensure supplements.  Pt reports 11 lbs of weight loss over 4 weeks. Per weight records,  No recent weights recorded PTA since 2018.  Labs reviewed. Medications: Vitamin C, Vitamin D,  Zinc sulfate  NUTRITION - FOCUSED PHYSICAL EXAM:  Unable to complete  Diet Order:   Diet Order            Diet Heart Room service appropriate? Yes; Fluid consistency: Thin  Diet effective now                 EDUCATION NEEDS:   No education needs have been identified at this time  Skin:  Skin Assessment: Reviewed RN Assessment  Last BM:  9/30  Height:   Ht Readings from Last 1 Encounters:  03/14/20 5\' 4"  (1.626 m)    Weight:   Wt Readings from Last 1 Encounters:  03/14/20 99.4 kg    BMI:  Body mass index is 37.61 kg/m.  Estimated Nutritional Needs:   Kcal:  1850-2050  Protein:  80-100g  Fluid:  1.8L/day   03/16/20, MS, RD, LDN Inpatient  Clinical Dietitian Contact information available via Amion

## 2020-03-15 NOTE — Progress Notes (Signed)
SATURATION QUALIFICATIONS: (This note is used to comply with regulatory documentation for home oxygen)  Patient Saturations on Room Air at Rest = 83%   Patient Saturations on 6 Liters of oxygen while Ambulating = 83%  Please briefly explain why patient needs home oxygen: to maintain appropriate SaO2 levels

## 2020-03-15 NOTE — Progress Notes (Signed)
PROGRESS NOTE    Nina Pitts  ZOX:[id number] DOB: 06-29-1960 DOA: 03/13/2020 PCP: Merri BrunettePharr, Walter, MD    Brief Narrative:  59 year old with history of hypothyroidism, hypertension, GERD and hyperlipidemia presented with shortness of breath and cough almost for 4 weeks. Seen at primary care physician's office and sent to ER with hypoxemia. In the emergency room positive for COVID-19. CT scan without PE, bilateral infiltrates with incidental finding of substantial emphysematous lungs.   Assessment & Plan:   Principal Problem:   COVID-19 virus infection Active Problems:   Hypothyroidism   GERD without esophagitis   Essential hypertension   Mixed hyperlipidemia   Acute respiratory failure with hypoxia (HCC)   Emphysema of lung (HCC)  Acute hypoxemic respiratory failure due to COVID-19 pneumonia: Continue to monitor due to significant symptoms  chest physiotherapy, incentive spirometry, deep breathing exercises, sputum induction, mucolytic's and bronchodilators. Supplemental oxygen to keep saturations more than 90%. Covid directed therapy with , steroids, on Solu-Medrol. remdesivir, day 3/5 Due to severity of symptoms, patient will need daily inflammatory markers, chest x-rays, liver function test to monitor and direct COVID-19 therapies. D-dimer is elevated.  CTA of the chest was negative for pulmonary embolism. Leg duplexes negative for DVT.  SpO2: 92 % O2 Flow Rate (L/min): 6 L/min  COVID-19 Labs  Recent Labs    03/13/20 1632 03/14/20 0330 03/15/20 0442  DDIMER 2.31* 2.08* 1.44*  CRP  --  2.8* 1.5*    Lab Results  Component Value Date   SARSCOV2NAA POSITIVE (A) 03/13/2020   Hypothyroidism: On Synthroid.  Hypertension: Blood pressures elevated. Resume home medications including losartan.  Chronic lung disease: History of smoking. May interfere with oxygen requirement on discharge.  Hyperlipidemia: On statin that she will continue.  DVT prophylaxis: Lovenox  subcu   Code Status: Full code. Family Communication: Patient talking to family. Disposition Plan: Status is: Inpatient  Remains inpatient appropriate because:Inpatient level of care appropriate due to severity of illness   Dispo: The patient is from: Home              Anticipated d/c is to: Home              Anticipated d/c date is: 3 days              Patient currently is not medically stable to d/c.         Consultants:  None Procedures:   None  Antimicrobials:   Remdesivir, day 2/5   Subjective: Patient seen and examined. STILL ON 5 LITERS , feel somehow better   Objective: Vitals:   03/14/20 1700 03/14/20 2100 03/15/20 0052 03/15/20 0533  BP: (!) 170/86 131/71 130/78 (!) 145/84  Pulse: 89 89 67 63  Resp: 20 18 18 18   Temp: 98.5 F (36.9 C) 98.7 F (37.1 C) 98.7 F (37.1 C) 97.7 F (36.5 C)  TempSrc: Oral Oral Axillary Axillary  SpO2: 98% 92% 93% 92%  Weight: 99.4 kg     Height: 5\' 4"  (1.626 m)       Intake/Output Summary (Last 24 hours) at 03/15/2020 0814 Last data filed at 03/15/2020 0536 Gross per 24 hour  Intake 1575 ml  Output 700 ml  Net 875 ml   Filed Weights   03/14/20 1700  Weight: 99.4 kg    Examination:  General exam: Looks fairly comfortable at rest on 5 L oxygen. Respiratory system: Clear to auscultation. Respiratory effort normal.  Poor bilateral air entry. IS only 250-300 cc  Cardiovascular system:  S1 & S2 heard, RRR. No JVD, murmurs, rubs, gallops or clicks. No pedal edema. Gastrointestinal system: Abdomen is nondistended, soft and nontender. No organomegaly or masses felt. Normal bowel sounds heard. Central nervous system: Alert and oriented. No focal neurological deficits. Extremities: Symmetric 5 x 5 power. Skin: No rashes, lesions or ulcers Psychiatry: Judgement and insight appear normal. Mood & affect appropriate.     Data Reviewed: I have personally reviewed following labs and imaging studies  CBC: Recent Labs    Lab 2020-03-23 1632 03/14/20 0330 03/15/20 0442  WBC 8.9 6.5 10.2  NEUTROABS 6.5 5.2 8.0*  HGB 13.6 13.4 13.9  HCT 43.1 42.4 44.5  MCV 96.2 95.9 97.4  PLT 323 304 312   Basic Metabolic Panel: Recent Labs  Lab 2020/03/23 1632 03/14/20 0330 03/15/20 0442  NA 140 144 141  K 3.6 4.4 4.2  CL 105 106 107  CO2 27 27 24   GLUCOSE 133* 114* 120*  BUN 8 6 12   CREATININE 0.72 0.63 0.64  CALCIUM 8.8* 9.2 9.4  MG  --  2.2 2.3   GFR: Estimated Creatinine Clearance: 87.9 mL/min (by C-G formula based on SCr of 0.64 mg/dL). Liver Function Tests: Recent Labs  Lab Mar 23, 2020 1632 03/14/20 0330 03/15/20 0442  AST 16 15 13*  ALT 13 14 14   ALKPHOS 60 62 59  BILITOT 0.5 0.2* 0.6  PROT 8.1 8.1 7.7  ALBUMIN 3.1* 3.0* 3.1*   No results for input(s): LIPASE, AMYLASE in the last 168 hours. No results for input(s): AMMONIA in the last 168 hours. Coagulation Profile: No results for input(s): INR, PROTIME in the last 168 hours. Cardiac Enzymes: No results for input(s): CKTOTAL, CKMB, CKMBINDEX, TROPONINI in the last 168 hours. BNP (last 3 results) No results for input(s): PROBNP in the last 8760 hours. HbA1C: No results for input(s): HGBA1C in the last 72 hours. CBG: No results for input(s): GLUCAP in the last 168 hours. Lipid Profile: No results for input(s): CHOL, HDL, LDLCALC, TRIG, CHOLHDL, LDLDIRECT in the last 72 hours. Thyroid Function Tests: No results for input(s): TSH, T4TOTAL, FREET4, T3FREE, THYROIDAB in the last 72 hours. Anemia Panel: No results for input(s): VITAMINB12, FOLATE, FERRITIN, TIBC, IRON, RETICCTPCT in the last 72 hours. Sepsis Labs: Recent Labs  Lab 03/23/2020 1632 Mar 23, 2020 2015 03/14/20 0330 03/15/20 0442  PROCALCITON  --  <0.10 <0.10 <0.10  LATICACIDVEN 1.5  --   --   --     Recent Results (from the past 240 hour(s))  Respiratory Panel by RT PCR (Flu A&B, Covid) - Nasopharyngeal Swab     Status: Abnormal   Collection Time: 03-23-20  4:32 PM   Specimen:  Nasopharyngeal Swab  Result Value Ref Range Status   SARS Coronavirus 2 by RT PCR POSITIVE (A) NEGATIVE Final    Comment: RESULT CALLED TO, READ BACK BY AND VERIFIED WITH: S.WEST AT 1830 ON 03-23-2020 BY N.THOMPSON (NOTE) SARS-CoV-2 target nucleic acids are DETECTED.  SARS-CoV-2 RNA is generally detectable in upper respiratory specimens  during the acute phase of infection. Positive results are indicative of the presence of the identified virus, but do not rule out bacterial infection or co-infection with other pathogens not detected by the test. Clinical correlation with patient history and other diagnostic information is necessary to determine patient infection status. The expected result is Negative.  Fact Sheet for Patients:  03/17/20  Fact Sheet for Healthcare Providers: 03/15/20  This test is not yet approved or cleared by the 03/15/20 and  has been authorized for detection and/or diagnosis of SARS-CoV-2 by FDA under an Emergency Use Authorization (EUA).  This EUA will remain in effect (meaning this test ca n be used) for the duration of  the COVID-19 declaration under Section 564(b)(1) of the Act, 21 U.S.C. section 360bbb-3(b)(1), unless the authorization is terminated or revoked sooner.      Influenza A by PCR NEGATIVE NEGATIVE Final   Influenza B by PCR NEGATIVE NEGATIVE Final    Comment: (NOTE) The Xpert Xpress SARS-CoV-2/FLU/RSV assay is intended as an aid in  the diagnosis of influenza from Nasopharyngeal swab specimens and  should not be used as a sole basis for treatment. Nasal washings and  aspirates are unacceptable for Xpert Xpress SARS-CoV-2/FLU/RSV  testing.  Fact Sheet for Patients: https://www.moore.com/  Fact Sheet for Healthcare Providers: https://www.young.biz/  This test is not yet approved or cleared by the [place] FDA and  has  been authorized for detection and/or diagnosis of SARS-CoV-2 by  FDA under an Emergency Use Authorization (EUA). This EUA will remain  in effect (meaning this test can be used) for the duration of the  Covid-19 declaration under Section 564(b)(1) of the Act, 21  U.S.C. section 360bbb-3(b)(1), unless the authorization is  terminated or revoked. Performed at [facility], [address], [place], [place] [address]          Radiology Studies: CT Angio Chest PE W and/or Wo Contrast  Result Date: 03/13/2020 CLINICAL DATA:  Shortness of breath with congestion and cough. EXAM: CT ANGIOGRAPHY CHEST WITH CONTRAST TECHNIQUE: Multidetector CT imaging of the chest was performed using the standard protocol during bolus administration of intravenous contrast. Multiplanar CT image reconstructions and MIPs were obtained to evaluate the vascular anatomy. CONTRAST:  OMNIPAQUE IOHEXOL 350 MG/ML SOLN COMPARISON:  None. FINDINGS: Cardiovascular: Satisfactory opacification of the pulmonary arteries to the segmental level. No evidence of pulmonary embolism. There is mild cardiomegaly. No pericardial effusion. Mediastinum/Nodes: There is mild mediastinal and bilateral hilar lymphadenopathy. The trachea and esophagus demonstrate no significant findings. Lungs/Pleura: There is marked severity emphysematous lung disease. Marked severity, predominately peripheral multifocal infiltrates are seen, bilaterally. There is a small left pleural effusion. No pneumothorax is identified Upper Abdomen: Multiple surgical clips are seen within the gallbladder fossa. Noninflamed diverticula are seen along the splenic flexure. Musculoskeletal: No chest wall abnormality. No acute or significant osseous findings. Review of the MIP images confirms the above findings. IMPRESSION: 1. Marked severity, predominately peripheral bilateral multifocal infiltrates. 2. Small left pleural effusion. 3. Marked severity  emphysematous lung disease. 4. Colonic diverticulosis. Emphysema (ICD10-J43.9). Electronically Signed   By: Aram Candela M.D.   On: 03/13/2020 20:25   DG Chest Portable 1 View  Result Date: 03/13/2020 CLINICAL DATA:  Cough and congestion EXAM: PORTABLE CHEST 1 VIEW COMPARISON:  February 08, 2013. FINDINGS: There is multifocal airspace opacity, primarily in the mid and lower lung regions. There is mild consolidation in the right base. Heart is upper normal in size with pulmonary vascularity normal. No adenopathy. No bone lesions. IMPRESSION: Multifocal airspace opacity. Appearance most consistent with multifocal pneumonia. Atypical organism pneumonia may present in this manner. Given this appearance, correlation with COVID-19 status advised. Heart size is upper normal.  No adenopathy evident. Electronically Signed   By: Bretta Bang III M.D.   On: 03/13/2020 15:17   VAS [place] LOWER EXTREMITY VENOUS (DVT)  Result Date: 03/14/2020  Lower Venous DVTStudy Indications: Elevated d-dimer, and SOB.  Anticoagulation: Lovenox. Comparison Study: No  prior studies. Performing Technologist: Jean Rosenthal  Examination Guidelines: A complete evaluation includes B-mode imaging, spectral Doppler, color Doppler, and power Doppler as needed of all accessible portions of each vessel. Bilateral testing is considered an integral part of a complete examination. Limited examinations for reoccurring indications may be performed as noted. The reflux portion of the exam is performed with the patient in reverse Trendelenburg.  +---------+---------------+---------+-----------+----------+--------------+ RIGHT    CompressibilityPhasicitySpontaneityPropertiesThrombus Aging +---------+---------------+---------+-----------+----------+--------------+ CFV      Full           Yes      Yes                                 +---------+---------------+---------+-----------+----------+--------------+ SFJ      Full                                                         +---------+---------------+---------+-----------+----------+--------------+ FV Prox  Full                                                        +---------+---------------+---------+-----------+----------+--------------+ FV Mid   Full                                                        +---------+---------------+---------+-----------+----------+--------------+ FV DistalFull                                                        +---------+---------------+---------+-----------+----------+--------------+ PFV      Full                                                        +---------+---------------+---------+-----------+----------+--------------+ POP      Full           Yes      Yes                                 +---------+---------------+---------+-----------+----------+--------------+ PTV      Full                                                        +---------+---------------+---------+-----------+----------+--------------+ PERO     Full                                                        +---------+---------------+---------+-----------+----------+--------------+   +---------+---------------+---------+-----------+----------+--------------+  LEFT     CompressibilityPhasicitySpontaneityPropertiesThrombus Aging +---------+---------------+---------+-----------+----------+--------------+ CFV      Full           Yes      Yes                                 +---------+---------------+---------+-----------+----------+--------------+ SFJ      Full                                                        +---------+---------------+---------+-----------+----------+--------------+ FV Prox  Full                                                        +---------+---------------+---------+-----------+----------+--------------+ FV Mid   Full                                                         +---------+---------------+---------+-----------+----------+--------------+ FV DistalFull                                                        +---------+---------------+---------+-----------+----------+--------------+ PFV      Full                                                        +---------+---------------+---------+-----------+----------+--------------+ POP      Full           Yes      Yes                                 +---------+---------------+---------+-----------+----------+--------------+ PTV      Full                                                        +---------+---------------+---------+-----------+----------+--------------+ PERO     Full                                                        +---------+---------------+---------+-----------+----------+--------------+     Summary: RIGHT: - There is no evidence of deep vein thrombosis in the lower extremity.  - No cystic structure found in the popliteal fossa.  LEFT: - There is no evidence of deep vein thrombosis in the lower extremity.  - No  cystic structure found in the popliteal fossa.  *See table(s) above for measurements and observations.    Preliminary         Scheduled Meds: . vitamin C  500 mg Oral Daily  . aspirin  81 mg Oral Daily  . atorvastatin  20 mg Oral Daily  . cholecalciferol  1,000 Units Oral Daily  . enoxaparin (LOVENOX) injection  50 mg Subcutaneous Q24H  . [START ON 03/17/2020] levothyroxine  150 mcg Oral Once per day on Sun Sat  . levothyroxine  175 mcg Oral Once per day on Mon Tue Wed Thu Fri  . losartan  50 mg Oral Daily  . methylPREDNISolone (SOLU-MEDROL) injection  60 mg Intravenous Q12H  . pantoprazole  40 mg Oral Daily  . zinc sulfate  220 mg Oral Daily   Continuous Infusions: . remdesivir 100 mg in NS 100 mL Stopped (03/14/20 1123)     LOS: 2 days    Time spent: 35 minutes .    Dorcas Carrow, MD [facility] Pager [phone]

## 2020-03-16 ENCOUNTER — Other Ambulatory Visit: Payer: Self-pay

## 2020-03-16 LAB — COMPREHENSIVE METABOLIC PANEL
ALT: 15 U/L (ref 0–44)
AST: 14 U/L — ABNORMAL LOW (ref 15–41)
Albumin: 3.2 g/dL — ABNORMAL LOW (ref 3.5–5.0)
Alkaline Phosphatase: 56 U/L (ref 38–126)
Anion gap: 11 (ref 5–15)
BUN: 21 mg/dL — ABNORMAL HIGH (ref 6–20)
CO2: 25 mmol/L (ref 22–32)
Calcium: 8.8 mg/dL — ABNORMAL LOW (ref 8.9–10.3)
Chloride: 105 mmol/L (ref 98–111)
Creatinine, Ser: 0.72 mg/dL (ref 0.44–1.00)
GFR calc Af Amer: >60 mL/min (ref >60)
GFR calc non Af Amer: >60 mL/min (ref >60)
Glucose, Bld: 124 mg/dL — ABNORMAL HIGH (ref 70–99)
Potassium: 4.4 mmol/L (ref 3.5–5.1)
Sodium: 141 mmol/L (ref 135–145)
Total Bilirubin: 0.6 mg/dL (ref 0.3–1.2)
Total Protein: 7.5 g/dL (ref 6.5–8.1)

## 2020-03-16 LAB — CBC WITH DIFFERENTIAL/PLATELET
Abs Immature Granulocytes: 0.03 10*3/uL (ref 0.00–0.07)
Basophils Absolute: 0 10*3/uL (ref 0.0–0.1)
Basophils Relative: 0 %
Eosinophils Absolute: 0 10*3/uL (ref 0.0–0.5)
Eosinophils Relative: 0 %
HCT: 44.6 % (ref 36.0–46.0)
Hemoglobin: 13.9 g/dL (ref 12.0–15.0)
Immature Granulocytes: 0 %
Lymphocytes Relative: 13 %
Lymphs Abs: 1.3 10*3/uL (ref 0.7–4.0)
MCH: 30.2 pg (ref 26.0–34.0)
MCHC: 31.2 g/dL (ref 30.0–36.0)
MCV: 97 fL (ref 80.0–100.0)
Monocytes Absolute: 0.6 10*3/uL (ref 0.1–1.0)
Monocytes Relative: 5 %
Neutro Abs: 8.7 10*3/uL — ABNORMAL HIGH (ref 1.7–7.7)
Neutrophils Relative %: 82 %
Platelets: 321 10*3/uL (ref 150–400)
RBC: 4.6 MIL/uL (ref 3.87–5.11)
RDW: 15.5 % (ref 11.5–15.5)
WBC: 10.6 10*3/uL — ABNORMAL HIGH (ref 4.0–10.5)
nRBC: 0 % (ref 0.0–0.2)

## 2020-03-16 LAB — D-DIMER, QUANTITATIVE: D-Dimer, Quant: 1.27 ug/mL-FEU — ABNORMAL HIGH (ref 0.00–0.50)

## 2020-03-16 LAB — C-REACTIVE PROTEIN: CRP: 0.8 mg/dL (ref <1.0)

## 2020-03-16 LAB — MAGNESIUM: Magnesium: 2.3 mg/dL (ref 1.7–2.4)

## 2020-03-16 MED ORDER — SALINE SPRAY 0.65 % NA SOLN
1.0000 | NASAL | Status: DC | PRN
  Administered 2020-03-16: 1 via NASAL
  Filled 2020-03-16 (×2): qty 44

## 2020-03-16 NOTE — Evaluation (Signed)
Occupational Therapy Evaluation Patient Details Name: Nina Pitts MRN: [id number] DOB: 30-Oct-1960 Today's Date: 03/16/2020    History of Present Illness 59 year old female with past medical history of hypothyroidism, hypertension, gastroesophageal reflux disease, hyperlipidemia, irritable bowel syndrome presenting with complaints of shortness of breath and cough. Dx of covid.   Clinical Impression   Nina Pitts is a 59 year old woman with above medical history who presents up in the recliner with o2 sat at 88% on 8L extended tubing. Patient reports just coming back from bathroom and bathing in bathroom on room air and that she is still recovering. Patient demonstrates functional upper body strength, ability to perform functional mobility and ability to perform ADLs from predominantly seated position secondary to decreased activity tolerance, weakness and impaired cardiopulmonary endurance. Patient's o2 sat dropped to 85% with MMT and dropped to 82% with 30 second marching. Patient's sat maintaining between 88-92% a rest. Patient encouraged to use incentive spirometer, perform side lying while in bed and perform arm and leg exercises while in chair. Patient verbalized understanding. Patient will benefit from skilled OT services while in hospital in order to maintain abilities, improve deficits and learn breathing techniques and positioning in order to return home at discharge.     Follow Up Recommendations  No OT follow up    Equipment Recommendations  None recommended by OT    Recommendations for Other Services       Precautions / Restrictions Precautions Precautions: None Precaution Comments: monitor O2 Restrictions Weight Bearing Restrictions: No      Mobility Bed Mobility               General bed mobility comments: up in recliner  Transfers Overall transfer level: Needs assistance   Transfers: Sit to/from Stand;Stand Pivot Transfers Sit to Stand: Min  guard Stand pivot transfers: Min guard            Balance Overall balance assessment: No apparent balance deficits (not formally assessed)                                         ADL either performed or assessed with clinical judgement   ADL Overall ADL's : Needs assistance/impaired Eating/Feeding: Independent   Grooming: Set up;Sitting   Upper Body Bathing: Set up;Sitting   Lower Body Bathing: Set up;Sit to/from stand;Min guard   Upper Body Dressing : Set up;Sitting   Lower Body Dressing: Set up;Min guard;Sit to/from stand   Toilet Transfer: Min guard;Ambulation;Grab bars;Regular Social worker and Hygiene: Min guard;Sit to/from stand       Functional mobility during ADLs: Min guard       Vision Patient Visual Report: No change from baseline Vision Assessment?: No apparent visual deficits     Perception     Praxis      Pertinent Vitals/Pain Pain Assessment: No/denies pain     Hand Dominance Right   Extremity/Trunk Assessment Upper Extremity Assessment Upper Extremity Assessment: Overall WFL for tasks assessed   Lower Extremity Assessment Lower Extremity Assessment: Defer to PT evaluation   Cervical / Trunk Assessment Cervical / Trunk Assessment: Normal   Communication Communication Communication: No difficulties   Cognition Arousal/Alertness: Awake/alert Behavior During Therapy: WFL for tasks assessed/performed Overall Cognitive Status: Within Functional Limits for tasks assessed  General Comments       Exercises     Shoulder Instructions      Home Living Family/patient expects to be discharged to:: Private residence Living Arrangements: Parent;Spouse/significant other Available Help at Discharge: Family Type of Home: House Home Access: Stairs to enter Secretary/administrator of Steps: 1   Home Layout: One level     Bathroom Shower/Tub:  Tub/shower unit         Home Equipment: Environmental consultant - 2 wheels;Bedside commode          Prior Functioning/Environment Level of Independence: Independent        Comments: cares for mother who has cancer, spouse works        OT Problem List: Decreased strength;Decreased activity tolerance;Cardiopulmonary status limiting activity;Obesity      OT Treatment/Interventions: Self-care/ADL training;Therapeutic exercise;Energy conservation;DME and/or AE instruction;Therapeutic activities;Patient/family education    OT Goals(Current goals can be found in the care plan section) Acute Rehab OT Goals Patient Stated Goal: care for her mother who has cancer OT Goal Formulation: With patient Time For Goal Achievement: 03/30/20 Potential to Achieve Goals: Good  OT Frequency: Min 2X/week   Barriers to D/C:            Co-evaluation              AM-PAC OT "6 Clicks" Daily Activity     Outcome Measure Help from another person eating meals?: None Help from another person taking care of personal grooming?: A Little Help from another person toileting, which includes using toliet, bedpan, or urinal?: A Little Help from another person bathing (including washing, rinsing, drying)?: A Little Help from another person to put on and taking off regular upper body clothing?: A Little Help from another person to put on and taking off regular lower body clothing?: A Little 6 Click Score: 19   End of Session Equipment Utilized During Treatment: Oxygen Nurse Communication: Mobility status  Activity Tolerance: Patient tolerated treatment well Patient left: in chair;with call bell/phone within reach  OT Visit Diagnosis: Muscle weakness (generalized) (M62.81)                Time: 1610-9604 OT Time Calculation (min): 16 min Charges:  OT General Charges $OT Visit: 1 Visit OT Evaluation $OT Eval Low Complexity: 1 Low  Nina Pitts, OTR/L Acute Care Rehab Services  Office [phone] Pager:  [phone]   Nina Pitts 03/16/2020, 11:40 AM

## 2020-03-16 NOTE — Progress Notes (Signed)
PROGRESS NOTE    Nina CureHattie D Pitts  QQV:[id number] DOB: 10-31-1960 DOA: 03/13/2020 PCP: Merri BrunettePharr, Walter, MD    Brief Narrative:  59 year old with history of hypothyroidism, hypertension, GERD and hyperlipidemia presented with shortness of breath and cough almost for 4 weeks. Seen at primary care physician's office and sent to ER with hypoxemia. In the emergency room positive for COVID-19. CT scan without PE, bilateral infiltrates with incidental finding of substantial emphysematous lungs.   Assessment & Plan:   Principal Problem:   COVID-19 virus infection Active Problems:   Hypothyroidism   GERD without esophagitis   Essential hypertension   Mixed hyperlipidemia   Acute respiratory failure with hypoxia (HCC)   Emphysema of lung (HCC)  Acute hypoxemic respiratory failure due to COVID-19 pneumonia: Continue to monitor due to significant symptoms, on 6 L of oxygen. chest physiotherapy, incentive spirometry, deep breathing exercises, sputum induction, mucolytic's and bronchodilators. Supplemental oxygen to keep saturations more than 90%. Covid directed therapy with , steroids, on Solu-Medrol. remdesivir, day 4/5 Due to severity of symptoms, patient will need daily inflammatory markers, chest x-rays, liver function test to monitor and direct COVID-19 therapies. D-dimer is elevated.  CTA of the chest was negative for pulmonary embolism. Leg duplexes negative for DVT. Start mobilizing with PT OT.  SpO2: 95 % O2 Flow Rate (L/min): 6 L/min  COVID-19 Labs  Recent Labs    03/14/20 0330 03/15/20 0442 03/16/20 0441  DDIMER 2.08* 1.44* 1.27*  CRP 2.8* 1.5* 0.8    Lab Results  Component Value Date   SARSCOV2NAA POSITIVE (A) 03/13/2020   Hypothyroidism: On Synthroid.  Hypertension: Blood pressures elevated. Resume home medications including losartan.  Stable today.  Chronic lung disease: History of smoking. May interfere with oxygen requirement on discharge.  Hyperlipidemia:  On statin that she will continue.  DVT prophylaxis: Lovenox subcu   Code Status: Full code. Family Communication: Patient talking to family. Disposition Plan: Status is: Inpatient  Remains inpatient appropriate because:Inpatient level of care appropriate due to severity of illness   Dispo: The patient is from: Home              Anticipated d/c is to: Home              Anticipated d/c date is: 2 to 3 days.  May go home with oxygen.              Patient currently is not medically stable to d/c.         Consultants:  None Procedures:   None  Antimicrobials:   Remdesivir, day 3/5   Subjective: Seen and examined.  Subjectively feels better.  Still difficulty with mobility with shortness of breath and requirement of oxygen.  Objective: Vitals:   03/15/20 1355 03/15/20 2009 03/16/20 0324 03/16/20 1409  BP:  (!) 150/86 (!) 152/94 (!) 148/72  Pulse:  73 92 72  Resp:  18 18 18   Temp:  98.5 F (36.9 C) 98.8 F (37.1 C) 98 F (36.7 C)  TempSrc:   Oral Oral  SpO2: (!) 83% 98% 94% 95%  Weight:      Height:        Intake/Output Summary (Last 24 hours) at 03/16/2020 1513 Last data filed at 03/16/2020 1450 Gross per 24 hour  Intake 1180 ml  Output --  Net 1180 ml   Filed Weights   03/14/20 1700  Weight: 99.4 kg    Examination:  General exam: Looks fairly comfortable at rest on 5 L oxygen. Respiratory  system: Clear to auscultation. Poor bilateral air entry. Poor respiratory report. Cardiovascular system: S1 & S2 heard, RRR. No JVD, murmurs, rubs, gallops or clicks. No pedal edema. Gastrointestinal system: Abdomen is nondistended, soft and nontender. No organomegaly or masses felt. Normal bowel sounds heard. Central nervous system: Alert and oriented. No focal neurological deficits. Extremities: Symmetric 5 x 5 power. Skin: No rashes, lesions or ulcers Psychiatry: Judgement and insight appear normal. Mood & affect appropriate.     Data Reviewed: I have  personally reviewed following labs and imaging studies  CBC: Recent Labs  Lab 03/13/20 1632 03/14/20 0330 03/15/20 0442 03/16/20 0441  WBC 8.9 6.5 10.2 10.6*  NEUTROABS 6.5 5.2 8.0* 8.7*  HGB 13.6 13.4 13.9 13.9  HCT 43.1 42.4 44.5 44.6  MCV 96.2 95.9 97.4 97.0  PLT 323 304 312 321   Basic Metabolic Panel: Recent Labs  Lab 03/13/20 1632 03/14/20 0330 03/15/20 0442 03/16/20 0441  NA 140 144 141 141  K 3.6 4.4 4.2 4.4  CL 105 106 107 105  CO2 27 27 24 25   GLUCOSE 133* 114* 120* 124*  BUN 8 6 12  21*  CREATININE 0.72 0.63 0.64 0.72  CALCIUM 8.8* 9.2 9.4 8.8*  MG  --  2.2 2.3 2.3   GFR: Estimated Creatinine Clearance: 87.9 mL/min (by C-G formula based on SCr of 0.72 mg/dL). Liver Function Tests: Recent Labs  Lab 03/13/20 1632 03/14/20 0330 03/15/20 0442 03/16/20 0441  AST 16 15 13* 14*  ALT 13 14 14 15   ALKPHOS 60 62 59 56  BILITOT 0.5 0.2* 0.6 0.6  PROT 8.1 8.1 7.7 7.5  ALBUMIN 3.1* 3.0* 3.1* 3.2*   No results for input(s): LIPASE, AMYLASE in the last 168 hours. No results for input(s): AMMONIA in the last 168 hours. Coagulation Profile: No results for input(s): INR, PROTIME in the last 168 hours. Cardiac Enzymes: No results for input(s): CKTOTAL, CKMB, CKMBINDEX, TROPONINI in the last 168 hours. BNP (last 3 results) No results for input(s): PROBNP in the last 8760 hours. HbA1C: No results for input(s): HGBA1C in the last 72 hours. CBG: No results for input(s): GLUCAP in the last 168 hours. Lipid Profile: No results for input(s): CHOL, HDL, LDLCALC, TRIG, CHOLHDL, LDLDIRECT in the last 72 hours. Thyroid Function Tests: No results for input(s): TSH, T4TOTAL, FREET4, T3FREE, THYROIDAB in the last 72 hours. Anemia Panel: No results for input(s): VITAMINB12, FOLATE, FERRITIN, TIBC, IRON, RETICCTPCT in the last 72 hours. Sepsis Labs: Recent Labs  Lab 03/13/20 1632 03/13/20 2015 03/14/20 0330 03/15/20 0442  PROCALCITON  --  <0.10 <0.10 <0.10    LATICACIDVEN 1.5  --   --   --     Recent Results (from the past 240 hour(s))  Respiratory Panel by RT PCR (Flu A&B, Covid) - Nasopharyngeal Swab     Status: Abnormal   Collection Time: 03/13/20  4:32 PM   Specimen: Nasopharyngeal Swab  Result Value Ref Range Status   SARS Coronavirus 2 by RT PCR POSITIVE (A) NEGATIVE Final    Comment: RESULT CALLED TO, READ BACK BY AND VERIFIED WITH: S.WEST AT 1830 ON 03/13/20 BY N.THOMPSON (NOTE) SARS-CoV-2 target nucleic acids are DETECTED.  SARS-CoV-2 RNA is generally detectable in upper respiratory specimens  during the acute phase of infection. Positive results are indicative of the presence of the identified virus, but do not rule out bacterial infection or co-infection with other pathogens not detected by the test. Clinical correlation with patient history and other diagnostic information is necessary  to determine patient infection status. The expected result is Negative.  Fact Sheet for Patients:  https://www.moore.com/  Fact Sheet for Healthcare Providers: https://www.young.biz/  This test is not yet approved or cleared by the [place] FDA and  has been authorized for detection and/or diagnosis of SARS-CoV-2 by FDA under an Emergency Use Authorization (EUA).  This EUA will remain in effect (meaning this test ca n be used) for the duration of  the COVID-19 declaration under Section 564(b)(1) of the Act, 21 U.S.C. section 360bbb-3(b)(1), unless the authorization is terminated or revoked sooner.      Influenza A by PCR NEGATIVE NEGATIVE Final   Influenza B by PCR NEGATIVE NEGATIVE Final    Comment: (NOTE) The Xpert Xpress SARS-CoV-2/FLU/RSV assay is intended as an aid in  the diagnosis of influenza from Nasopharyngeal swab specimens and  should not be used as a sole basis for treatment. Nasal washings and  aspirates are unacceptable for Xpert Xpress SARS-CoV-2/FLU/RSV  testing.  Fact  Sheet for Patients: https://www.moore.com/  Fact Sheet for Healthcare Providers: https://www.young.biz/  This test is not yet approved or cleared by the [place] FDA and  has been authorized for detection and/or diagnosis of SARS-CoV-2 by  FDA under an Emergency Use Authorization (EUA). This EUA will remain  in effect (meaning this test can be used) for the duration of the  Covid-19 declaration under Section 564(b)(1) of the Act, 21  U.S.C. section 360bbb-3(b)(1), unless the authorization is  terminated or revoked. Performed at [facility], [address] [place], [place] [address]          Radiology Studies: VAS [place] LOWER EXTREMITY VENOUS (DVT)  Result Date: 03/15/2020  Lower Venous DVTStudy Indications: Elevated d-dimer, and SOB.  Anticoagulation: Lovenox. Comparison Study: No prior studies. Performing Technologist: Jean Rosenthal  Examination Guidelines: A complete evaluation includes B-mode imaging, spectral Doppler, color Doppler, and power Doppler as needed of all accessible portions of each vessel. Bilateral testing is considered an integral part of a complete examination. Limited examinations for reoccurring indications may be performed as noted. The reflux portion of the exam is performed with the patient in reverse Trendelenburg.  +---------+---------------+---------+-----------+----------+--------------+ RIGHT    CompressibilityPhasicitySpontaneityPropertiesThrombus Aging +---------+---------------+---------+-----------+----------+--------------+ CFV      Full           Yes      Yes                                 +---------+---------------+---------+-----------+----------+--------------+ SFJ      Full                                                        +---------+---------------+---------+-----------+----------+--------------+ FV Prox  Full                                                         +---------+---------------+---------+-----------+----------+--------------+ FV Mid   Full                                                        +---------+---------------+---------+-----------+----------+--------------+  FV DistalFull                                                        +---------+---------------+---------+-----------+----------+--------------+ PFV      Full                                                        +---------+---------------+---------+-----------+----------+--------------+ POP      Full           Yes      Yes                                 +---------+---------------+---------+-----------+----------+--------------+ PTV      Full                                                        +---------+---------------+---------+-----------+----------+--------------+ PERO     Full                                                        +---------+---------------+---------+-----------+----------+--------------+   +---------+---------------+---------+-----------+----------+--------------+ LEFT     CompressibilityPhasicitySpontaneityPropertiesThrombus Aging +---------+---------------+---------+-----------+----------+--------------+ CFV      Full           Yes      Yes                                 +---------+---------------+---------+-----------+----------+--------------+ SFJ      Full                                                        +---------+---------------+---------+-----------+----------+--------------+ FV Prox  Full                                                        +---------+---------------+---------+-----------+----------+--------------+ FV Mid   Full                                                        +---------+---------------+---------+-----------+----------+--------------+ FV DistalFull                                                         +---------+---------------+---------+-----------+----------+--------------+  PFV      Full                                                        +---------+---------------+---------+-----------+----------+--------------+ POP      Full           Yes      Yes                                 +---------+---------------+---------+-----------+----------+--------------+ PTV      Full                                                        +---------+---------------+---------+-----------+----------+--------------+ PERO     Full                                                        +---------+---------------+---------+-----------+----------+--------------+     Summary: RIGHT: - There is no evidence of deep vein thrombosis in the lower extremity.  - No cystic structure found in the popliteal fossa.  LEFT: - There is no evidence of deep vein thrombosis in the lower extremity.  - No cystic structure found in the popliteal fossa.  *See table(s) above for measurements and observations. Electronically signed by Sherald Hess MD on 03/15/2020 at 2:12:41 PM.    Final         Scheduled Meds: . vitamin C  500 mg Oral Daily  . aspirin  81 mg Oral Daily  . atorvastatin  20 mg Oral Daily  . cholecalciferol  1,000 Units Oral Daily  . enoxaparin (LOVENOX) injection  50 mg Subcutaneous Q24H  . feeding supplement (ENSURE ENLIVE)  237 mL Oral BID BM  . [START ON 03/17/2020] levothyroxine  150 mcg Oral Once per day on Sun Sat  . levothyroxine  175 mcg Oral Once per day on Mon Tue Wed Thu Fri  . losartan  50 mg Oral Daily  . methylPREDNISolone (SOLU-MEDROL) injection  60 mg Intravenous Q12H  . pantoprazole  40 mg Oral Daily  . zinc sulfate  220 mg Oral Daily   Continuous Infusions: . remdesivir 100 mg in NS 100 mL 100 mg (03/16/20 1013)     LOS: 3 days    Time spent: 35 minutes .    Dorcas Carrow, MD [facility] Pager [phone]

## 2020-03-17 LAB — CBC WITH DIFFERENTIAL/PLATELET
Abs Immature Granulocytes: 0.04 10*3/uL (ref 0.00–0.07)
Basophils Absolute: 0 10*3/uL (ref 0.0–0.1)
Basophils Relative: 0 %
Eosinophils Absolute: 0 10*3/uL (ref 0.0–0.5)
Eosinophils Relative: 0 %
HCT: 44.1 % (ref 36.0–46.0)
Hemoglobin: 13.7 g/dL (ref 12.0–15.0)
Immature Granulocytes: 0 %
Lymphocytes Relative: 14 %
Lymphs Abs: 1.5 10*3/uL (ref 0.7–4.0)
MCH: 30.4 pg (ref 26.0–34.0)
MCHC: 31.1 g/dL (ref 30.0–36.0)
MCV: 97.8 fL (ref 80.0–100.0)
Monocytes Absolute: 0.6 10*3/uL (ref 0.1–1.0)
Monocytes Relative: 6 %
Neutro Abs: 8.3 10*3/uL — ABNORMAL HIGH (ref 1.7–7.7)
Neutrophils Relative %: 80 %
Platelets: 310 10*3/uL (ref 150–400)
RBC: 4.51 MIL/uL (ref 3.87–5.11)
RDW: 15.3 % (ref 11.5–15.5)
WBC: 10.4 10*3/uL (ref 4.0–10.5)
nRBC: 0 % (ref 0.0–0.2)

## 2020-03-17 LAB — COMPREHENSIVE METABOLIC PANEL
ALT: 17 U/L (ref 0–44)
AST: 17 U/L (ref 15–41)
Albumin: 3 g/dL — ABNORMAL LOW (ref 3.5–5.0)
Alkaline Phosphatase: 61 U/L (ref 38–126)
Anion gap: 11 (ref 5–15)
BUN: 20 mg/dL (ref 6–20)
CO2: 28 mmol/L (ref 22–32)
Calcium: 9.4 mg/dL (ref 8.9–10.3)
Chloride: 103 mmol/L (ref 98–111)
Creatinine, Ser: 0.58 mg/dL (ref 0.44–1.00)
GFR calc Af Amer: >60 mL/min (ref >60)
GFR calc non Af Amer: >60 mL/min (ref >60)
Glucose, Bld: 107 mg/dL — ABNORMAL HIGH (ref 70–99)
Potassium: 4.3 mmol/L (ref 3.5–5.1)
Sodium: 142 mmol/L (ref 135–145)
Total Bilirubin: 0.7 mg/dL (ref 0.3–1.2)
Total Protein: 7.4 g/dL (ref 6.5–8.1)

## 2020-03-17 LAB — MAGNESIUM: Magnesium: 2.5 mg/dL — ABNORMAL HIGH (ref 1.7–2.4)

## 2020-03-17 LAB — D-DIMER, QUANTITATIVE: D-Dimer, Quant: 1.03 ug/mL-FEU — ABNORMAL HIGH (ref 0.00–0.50)

## 2020-03-17 LAB — C-REACTIVE PROTEIN: CRP: 0.5 mg/dL (ref <1.0)

## 2020-03-17 NOTE — Progress Notes (Signed)
PROGRESS NOTE    Nina Pitts  BDZ:[id number] DOB: 10-20-1960 DOA: 03/13/2020 PCP: Merri Brunette, MD    Brief Narrative:  59 year old with history of hypothyroidism, hypertension, GERD and hyperlipidemia presented with shortness of breath and cough almost for 4 weeks. Seen at primary care physician's office and sent to ER with hypoxemia. In the emergency room positive for COVID-19. CT scan without PE, bilateral infiltrates with incidental finding of substantial emphysematous lungs.   Assessment & Plan:   Principal Problem:   COVID-19 virus infection Active Problems:   Hypothyroidism   GERD without esophagitis   Essential hypertension   Mixed hyperlipidemia   Acute respiratory failure with hypoxia (HCC)   Emphysema of lung (HCC)  Acute hypoxemic respiratory failure due to COVID-19 pneumonia: Continue to monitor due to significant symptoms, on 5 L of oxygen. chest physiotherapy, incentive spirometry, deep breathing exercises, sputum induction, mucolytic's and bronchodilators. Supplemental oxygen to keep saturations more than 90%. Covid directed therapy with , steroids, on Solu-Medrol. remdesivir, day 5/5 Due to severity of symptoms, patient will need daily inflammatory markers, chest x-rays, liver function test to monitor and direct COVID-19 therapies. D-dimer is elevated.  CTA of the chest was negative for pulmonary embolism. Leg duplexes negative for DVT. Start mobilizing with PT OT.  SpO2: 97 % O2 Flow Rate (L/min): 5 L/min  COVID-19 Labs  Recent Labs    03/15/20 0442 03/16/20 0441 03/17/20 0546  DDIMER 1.44* 1.27* 1.03*  CRP 1.5* 0.8 0.5    Lab Results  Component Value Date   SARSCOV2NAA POSITIVE (A) 03/13/2020   Hypothyroidism: On Synthroid.  Hypertension: Blood pressures elevated. Resume home medications including losartan.  Stable today.  Chronic lung disease: History of smoking. May interfere with oxygen requirement on discharge.  Hyperlipidemia:  On statin that she will continue.  DVT prophylaxis: Lovenox subcu   Code Status: Full code. Family Communication: Patient talking to family. Disposition Plan: Status is: Inpatient  Remains inpatient appropriate because:Inpatient level of care appropriate due to severity of illness   Dispo: The patient is from: Home              Anticipated d/c is to: Home              Anticipated d/c date is: Probable discharge tomorrow.              Patient currently is not medically stable to d/c.         Consultants:  None Procedures:   None  Antimicrobials:   Remdesivir, day 3/5   Subjective: Patient seen and examined.  Still feels better with improved appetite and breathing.  He still on 5 L oxygen.  Objective: Vitals:   03/16/20 2335 03/17/20 0200 03/17/20 0400 03/17/20 0550  BP:    130/82  Pulse:    (!) 56  Resp:    19  Temp:    98.4 F (36.9 C)  TempSrc:      SpO2: 100% 97% 96% 97%  Weight:      Height:        Intake/Output Summary (Last 24 hours) at 03/17/2020 1353 Last data filed at 03/17/2020 0840 Gross per 24 hour  Intake 1070.84 ml  Output --  Net 1070.84 ml   Filed Weights   03/14/20 1700  Weight: 99.4 kg    Examination:  General exam: Looks fairly comfortable at rest on 5 L oxygen. Respiratory system: Clear to auscultation. Poor bilateral air entry. Poor respiratory report. Cardiovascular system: S1 & S2  heard, RRR. No JVD, murmurs, rubs, gallops or clicks. No pedal edema. Gastrointestinal system: Abdomen is nondistended, soft and nontender. No organomegaly or masses felt. Normal bowel sounds heard. Central nervous system: Alert and oriented. No focal neurological deficits. Extremities: Symmetric 5 x 5 power. Skin: No rashes, lesions or ulcers Psychiatry: Judgement and insight appear normal. Mood & affect appropriate.     Data Reviewed: I have personally reviewed following labs and imaging studies  CBC: Recent Labs  Lab 03/13/20 1632  03/14/20 0330 03/15/20 0442 03/16/20 0441 03/17/20 0546  WBC 8.9 6.5 10.2 10.6* 10.4  NEUTROABS 6.5 5.2 8.0* 8.7* 8.3*  HGB 13.6 13.4 13.9 13.9 13.7  HCT 43.1 42.4 44.5 44.6 44.1  MCV 96.2 95.9 97.4 97.0 97.8  PLT 323 304 312 321 310   Basic Metabolic Panel: Recent Labs  Lab 03/13/20 1632 03/14/20 0330 03/15/20 0442 03/16/20 0441 03/17/20 0546  NA 140 144 141 141 142  K 3.6 4.4 4.2 4.4 4.3  CL 105 106 107 105 103  CO2 27 27 24 25 28   GLUCOSE 133* 114* 120* 124* 107*  BUN 8 6 12  21* 20  CREATININE 0.72 0.63 0.64 0.72 0.58  CALCIUM 8.8* 9.2 9.4 8.8* 9.4  MG  --  2.2 2.3 2.3 2.5*   GFR: Estimated Creatinine Clearance: 87.9 mL/min (by C-G formula based on SCr of 0.58 mg/dL). Liver Function Tests: Recent Labs  Lab 03/13/20 1632 03/14/20 0330 03/15/20 0442 03/16/20 0441 03/17/20 0546  AST 16 15 13* 14* 17  ALT 13 14 14 15 17   ALKPHOS 60 62 59 56 61  BILITOT 0.5 0.2* 0.6 0.6 0.7  PROT 8.1 8.1 7.7 7.5 7.4  ALBUMIN 3.1* 3.0* 3.1* 3.2* 3.0*   No results for input(s): LIPASE, AMYLASE in the last 168 hours. No results for input(s): AMMONIA in the last 168 hours. Coagulation Profile: No results for input(s): INR, PROTIME in the last 168 hours. Cardiac Enzymes: No results for input(s): CKTOTAL, CKMB, CKMBINDEX, TROPONINI in the last 168 hours. BNP (last 3 results) No results for input(s): PROBNP in the last 8760 hours. HbA1C: No results for input(s): HGBA1C in the last 72 hours. CBG: No results for input(s): GLUCAP in the last 168 hours. Lipid Profile: No results for input(s): CHOL, HDL, LDLCALC, TRIG, CHOLHDL, LDLDIRECT in the last 72 hours. Thyroid Function Tests: No results for input(s): TSH, T4TOTAL, FREET4, T3FREE, THYROIDAB in the last 72 hours. Anemia Panel: No results for input(s): VITAMINB12, FOLATE, FERRITIN, TIBC, IRON, RETICCTPCT in the last 72 hours. Sepsis Labs: Recent Labs  Lab 03/13/20 1632 03/13/20 2015 03/14/20 0330 03/15/20 0442    PROCALCITON  --  <0.10 <0.10 <0.10  LATICACIDVEN 1.5  --   --   --     Recent Results (from the past 240 hour(s))  Respiratory Panel by RT PCR (Flu A&B, Covid) - Nasopharyngeal Swab     Status: Abnormal   Collection Time: 03/13/20  4:32 PM   Specimen: Nasopharyngeal Swab  Result Value Ref Range Status   SARS Coronavirus 2 by RT PCR POSITIVE (A) NEGATIVE Final    Comment: RESULT CALLED TO, READ BACK BY AND VERIFIED WITH: S.WEST AT 1830 ON 03/13/20 BY N.THOMPSON (NOTE) SARS-CoV-2 target nucleic acids are DETECTED.  SARS-CoV-2 RNA is generally detectable in upper respiratory specimens  during the acute phase of infection. Positive results are indicative of the presence of the identified virus, but do not rule out bacterial infection or co-infection with other pathogens not detected by the test.  Clinical correlation with patient history and other diagnostic information is necessary to determine patient infection status. The expected result is Negative.  Fact Sheet for Patients:  https://www.moore.com/  Fact Sheet for Healthcare Providers: https://www.young.biz/  This test is not yet approved or cleared by the [place] FDA and  has been authorized for detection and/or diagnosis of SARS-CoV-2 by FDA under an Emergency Use Authorization (EUA).  This EUA will remain in effect (meaning this test ca n be used) for the duration of  the COVID-19 declaration under Section 564(b)(1) of the Act, 21 U.S.C. section 360bbb-3(b)(1), unless the authorization is terminated or revoked sooner.      Influenza A by PCR NEGATIVE NEGATIVE Final   Influenza B by PCR NEGATIVE NEGATIVE Final    Comment: (NOTE) The Xpert Xpress SARS-CoV-2/FLU/RSV assay is intended as an aid in  the diagnosis of influenza from Nasopharyngeal swab specimens and  should not be used as a sole basis for treatment. Nasal washings and  aspirates are unacceptable for Xpert Xpress  SARS-CoV-2/FLU/RSV  testing.  Fact Sheet for Patients: https://www.moore.com/  Fact Sheet for Healthcare Providers: https://www.young.biz/  This test is not yet approved or cleared by the [place] FDA and  has been authorized for detection and/or diagnosis of SARS-CoV-2 by  FDA under an Emergency Use Authorization (EUA). This EUA will remain  in effect (meaning this test can be used) for the duration of the  Covid-19 declaration under Section 564(b)(1) of the Act, 21  U.S.C. section 360bbb-3(b)(1), unless the authorization is  terminated or revoked. Performed at [facility], [address], [place], [place] [address]          Radiology Studies: No results found.      Scheduled Meds: . vitamin C  500 mg Oral Daily  . aspirin  81 mg Oral Daily  . atorvastatin  20 mg Oral Daily  . cholecalciferol  1,000 Units Oral Daily  . enoxaparin (LOVENOX) injection  50 mg Subcutaneous Q24H  . feeding supplement (ENSURE ENLIVE)  237 mL Oral BID BM  . levothyroxine  150 mcg Oral Once per day on Sun Sat  . levothyroxine  175 mcg Oral Once per day on Mon Tue Wed Thu Fri  . losartan  50 mg Oral Daily  . methylPREDNISolone (SOLU-MEDROL) injection  60 mg Intravenous Q12H  . pantoprazole  40 mg Oral Daily  . zinc sulfate  220 mg Oral Daily   Continuous Infusions:    LOS: 4 days    Time spent: 35 minutes .    Dorcas Carrow, MD [facility] Pager [phone]

## 2020-03-17 NOTE — Progress Notes (Signed)
Physical Therapy Treatment Patient Details Name: Nina Pitts MRN: [id number] DOB: Jul 23, 1960 Today's Date: 03/17/2020    History of Present Illness 59 year old female with past medical history of hypothyroidism, hypertension, gastroesophageal reflux disease, hyperlipidemia, irritable bowel syndrome presenting with complaints of shortness of breath and cough. Dx of covid.    PT Comments    The patient  Is eager to ambulate . Patient resting in recliner on 8 L [place] with an extension, SPO2 92%.. Per epic, last  doc was 5 L. Patient placed on 5 L. Patient transferred to [facility], SPO2 88%. Patient ambulated x 200' slowly with occassional steady assist  At railing and 1 rest break. SPO2 maintained at 86%. HR 92. After return to room and resting, SPO2 back to 90%.  Patient provided theraband for UE exercises. Encouraged use of IS and flutter.  Continue progressive PT, monitor SPO2.   Follow Up Recommendations  No PT follow up     Equipment Recommendations  Other (comment)    Recommendations for Other Services       Precautions / Restrictions Precautions Precaution Comments: monitor O2    Mobility  Bed Mobility               General bed mobility comments: up in recliner  Transfers Overall transfer level: Modified independent               General transfer comment: self assists to BSc  Ambulation/Gait Ambulation/Gait assistance: Supervision Gait Distance (Feet): 200 Feet Assistive device: None Gait Pattern/deviations: Step-through pattern Gait velocity: decr   General Gait Details: occassional use of rail in hallway. 1 stop and rest break   Optometrist    Modified Rankin (Stroke Patients Only)       Balance Overall balance assessment: No apparent balance deficits (not formally assessed)                                          Cognition Arousal/Alertness: Awake/alert Behavior During Therapy: WFL for  tasks assessed/performed                                          Exercises General Exercises - Upper Extremity Shoulder Flexion: 5 reps;Both;Strengthening Shoulder Extension: Both;5 reps;Seated;Theraband;Strengthening Shoulder ABduction: Seated;Theraband;Strengthening;Both;5 reps Shoulder Horizontal ABduction: Strengthening;Both;5 reps    General Comments        Pertinent Vitals/Pain Pain Assessment: No/denies pain    Home Living                      Prior Function            PT Goals (current goals can now be found in the care plan section) Progress towards PT goals: Progressing toward goals    Frequency    Min 3X/week      PT Plan Current plan remains appropriate    Co-evaluation              AM-PAC PT "6 Clicks" Mobility   Outcome Measure  Help needed turning from your back to your side while in a flat bed without using bedrails?: None Help needed moving from lying on your back to sitting on the side of a flat bed without  using bedrails?: None Help needed moving to and from a bed to a chair (including a wheelchair)?: None Help needed standing up from a chair using your arms (e.g., wheelchair or bedside chair)?: None Help needed to walk in hospital room?: None Help needed climbing 3-5 steps with a railing? : A Little 6 Click Score: 23    End of Session Equipment Utilized During Treatment: Oxygen Activity Tolerance: Patient tolerated treatment well Patient left: in chair;with call bell/phone within reach Nurse Communication: Mobility status PT Visit Diagnosis: Difficulty in walking, not elsewhere classified (R26.2)     Time: 1325-1350 PT Time Calculation (min) (ACUTE ONLY): 25 min  Charges:  $Gait Training: 23-37 mins                      Blanchard Kelch PT Acute Rehabilitation Services Pager [phone] Office [phone]    Rada Hay 03/17/2020, 2:21 PM

## 2020-03-18 LAB — COMPREHENSIVE METABOLIC PANEL
ALT: 17 U/L (ref 0–44)
AST: 15 U/L (ref 15–41)
Albumin: 3 g/dL — ABNORMAL LOW (ref 3.5–5.0)
Alkaline Phosphatase: 53 U/L (ref 38–126)
Anion gap: 9 (ref 5–15)
BUN: 22 mg/dL — ABNORMAL HIGH (ref 6–20)
CO2: 28 mmol/L (ref 22–32)
Calcium: 9 mg/dL (ref 8.9–10.3)
Chloride: 101 mmol/L (ref 98–111)
Creatinine, Ser: 0.61 mg/dL (ref 0.44–1.00)
GFR calc Af Amer: >60 mL/min (ref >60)
GFR calc non Af Amer: >60 mL/min (ref >60)
Glucose, Bld: 121 mg/dL — ABNORMAL HIGH (ref 70–99)
Potassium: 4.4 mmol/L (ref 3.5–5.1)
Sodium: 138 mmol/L (ref 135–145)
Total Bilirubin: 0.7 mg/dL (ref 0.3–1.2)
Total Protein: 7.2 g/dL (ref 6.5–8.1)

## 2020-03-18 LAB — CBC WITH DIFFERENTIAL/PLATELET
Abs Immature Granulocytes: 0.04 10*3/uL (ref 0.00–0.07)
Basophils Absolute: 0 10*3/uL (ref 0.0–0.1)
Basophils Relative: 0 %
Eosinophils Absolute: 0 10*3/uL (ref 0.0–0.5)
Eosinophils Relative: 0 %
HCT: 43.9 % (ref 36.0–46.0)
Hemoglobin: 13.7 g/dL (ref 12.0–15.0)
Immature Granulocytes: 1 %
Lymphocytes Relative: 16 %
Lymphs Abs: 1.3 10*3/uL (ref 0.7–4.0)
MCH: 30.4 pg (ref 26.0–34.0)
MCHC: 31.2 g/dL (ref 30.0–36.0)
MCV: 97.6 fL (ref 80.0–100.0)
Monocytes Absolute: 0.3 10*3/uL (ref 0.1–1.0)
Monocytes Relative: 4 %
Neutro Abs: 6.3 10*3/uL (ref 1.7–7.7)
Neutrophils Relative %: 79 %
Platelets: 300 10*3/uL (ref 150–400)
RBC: 4.5 MIL/uL (ref 3.87–5.11)
RDW: 15.2 % (ref 11.5–15.5)
WBC: 7.9 10*3/uL (ref 4.0–10.5)
nRBC: 0 % (ref 0.0–0.2)

## 2020-03-18 LAB — D-DIMER, QUANTITATIVE: D-Dimer, Quant: 0.6 ug/mL-FEU — ABNORMAL HIGH (ref 0.00–0.50)

## 2020-03-18 LAB — C-REACTIVE PROTEIN: CRP: <0.5 mg/dL (ref <1.0)

## 2020-03-18 LAB — MAGNESIUM: Magnesium: 2.5 mg/dL — ABNORMAL HIGH (ref 1.7–2.4)

## 2020-03-18 NOTE — Progress Notes (Signed)
SATURATION QUALIFICATIONS: (This note is used to comply with regulatory documentation for home oxygen)  Patient Saturations on Room Air at Rest = 85 %  Patient Saturations on Room Air while Ambulating 80%  Patient Saturations on 6 Liters of oxygen while Ambulating = 91%  Please briefly explain why patient needs home oxygen: Patient unable to maintain adequate oxygenation above 90% while at rest and while ambulating .

## 2020-03-18 NOTE — Progress Notes (Signed)
PROGRESS NOTE    Nina Pitts  ZDG:[id number] DOB: 12/23/1960 DOA: 03/13/2020 PCP: Merri Brunette, MD    Brief Narrative:  59 year old with history of hypothyroidism, hypertension, GERD and hyperlipidemia presented with shortness of breath and cough almost for 4 weeks. Seen at primary care physician's office and sent to ER with hypoxemia. In the emergency room positive for COVID-19. CT scan without PE, bilateral infiltrates with incidental finding of substantial emphysematous lungs.   Assessment & Plan:   Principal Problem:   COVID-19 virus infection Active Problems:   Hypothyroidism   GERD without esophagitis   Essential hypertension   Mixed hyperlipidemia   Acute respiratory failure with hypoxia (HCC)   Emphysema of lung (HCC)  Acute hypoxemic respiratory failure due to COVID-19 pneumonia: Continue to monitor due to significant symptoms, on 5 L of oxygen. chest physiotherapy, incentive spirometry, deep breathing exercises, sputum induction, mucolytic's and bronchodilators. Supplemental oxygen to keep saturations more than 90%. Covid directed therapy with , steroids, on Solu-Medrol. remdesivir, completed 5 days of therapy. Due to severity of symptoms, patient will need daily inflammatory markers, chest x-rays, liver function test to monitor and direct COVID-19 therapies. D-dimer is elevated.  CTA of the chest was negative for pulmonary embolism. Leg duplexes negative for DVT. Continue to mobilize.  SpO2: 93 % O2 Flow Rate (L/min): 4 L/min  COVID-19 Labs  Recent Labs    03/16/20 0441 03/17/20 0546 03/18/20 0531  DDIMER 1.27* 1.03* 0.60*  CRP 0.8 0.5 <0.5    Lab Results  Component Value Date   SARSCOV2NAA POSITIVE (A) 03/13/2020   Hypothyroidism: On Synthroid.  Hypertension: Blood pressures elevated. Resume home medications including losartan.  Stable today.  Chronic lung disease: History of smoking. May interfere with oxygen requirement on  discharge.  Hyperlipidemia: On statin that she will continue.  DVT prophylaxis: Lovenox subcu   Code Status: Full code. Family Communication: Patient talking to family. Disposition Plan: Status is: Inpatient  Remains inpatient appropriate because:Inpatient level of care appropriate due to severity of illness   Dispo: The patient is from: Home              Anticipated d/c is to: Home              Anticipated d/c date is: Probable discharge tomorrow 10/4.              Patient currently is not medically stable to d/c.   Continue to mobilize.  Check for supplemental oxygen need for possible discharge tomorrow.      Consultants:  None Procedures:   None  Antimicrobials:   Remdesivir, completed therapy.   Subjective: Patient seen and examined.  Mostly on 4 L oxygen at rest.  She has some trouble walking with shortness of breath.  Feels much better than presentation.  She thinks if she continues to feel better she may be able to go home with oxygen tomorrow.  Objective: Vitals:   03/17/20 0550 03/17/20 1408 03/17/20 2028 03/18/20 0535  BP: 130/82 140/81 139/82 115/66  Pulse: (!) 56 75 66 61  Resp: 19 18 18 18   Temp: 98.4 F (36.9 C) 97.7 F (36.5 C) 97.9 F (36.6 C) 98.2 F (36.8 C)  TempSrc:   Oral Oral  SpO2: 97% 91% 96% 93%  Weight:      Height:        Intake/Output Summary (Last 24 hours) at 03/18/2020 1334 Last data filed at 03/18/2020 1252 Gross per 24 hour  Intake 360 ml  Output --  Net 360 ml   Filed Weights   03/14/20 1700  Weight: 99.4 kg    Examination:  General exam: Looks fairly comfortable at rest on 4 L. Respiratory system: Clear to auscultation. Poor bilateral air entry. Poor respiratory report. Cardiovascular system: S1 & S2 heard, RRR. No JVD, murmurs, rubs, gallops or clicks. No pedal edema. Gastrointestinal system: Abdomen is nondistended, soft and nontender. No organomegaly or masses felt. Normal bowel sounds heard. Central  nervous system: Alert and oriented. No focal neurological deficits. Extremities: Symmetric 5 x 5 power. Skin: No rashes, lesions or ulcers Psychiatry: Judgement and insight appear normal. Mood & affect appropriate.     Data Reviewed: I have personally reviewed following labs and imaging studies  CBC: Recent Labs  Lab 03/14/20 0330 03/15/20 0442 03/16/20 0441 03/17/20 0546 03/18/20 0531  WBC 6.5 10.2 10.6* 10.4 7.9  NEUTROABS 5.2 8.0* 8.7* 8.3* 6.3  HGB 13.4 13.9 13.9 13.7 13.7  HCT 42.4 44.5 44.6 44.1 43.9  MCV 95.9 97.4 97.0 97.8 97.6  PLT 304 312 321 310 300   Basic Metabolic Panel: Recent Labs  Lab 03/14/20 0330 03/15/20 0442 03/16/20 0441 03/17/20 0546 03/18/20 0531  NA 144 141 141 142 138  K 4.4 4.2 4.4 4.3 4.4  CL 106 107 105 103 101  CO2 27 24 25 28 28   GLUCOSE 114* 120* 124* 107* 121*  BUN 6 12 21* 20 22*  CREATININE 0.63 0.64 0.72 0.58 0.61  CALCIUM 9.2 9.4 8.8* 9.4 9.0  MG 2.2 2.3 2.3 2.5* 2.5*   GFR: Estimated Creatinine Clearance: 87.9 mL/min (by C-G formula based on SCr of 0.61 mg/dL). Liver Function Tests: Recent Labs  Lab 03/14/20 0330 03/15/20 0442 03/16/20 0441 03/17/20 0546 03/18/20 0531  AST 15 13* 14* 17 15  ALT 14 14 15 17 17   ALKPHOS 62 59 56 61 53  BILITOT 0.2* 0.6 0.6 0.7 0.7  PROT 8.1 7.7 7.5 7.4 7.2  ALBUMIN 3.0* 3.1* 3.2* 3.0* 3.0*   No results for input(s): LIPASE, AMYLASE in the last 168 hours. No results for input(s): AMMONIA in the last 168 hours. Coagulation Profile: No results for input(s): INR, PROTIME in the last 168 hours. Cardiac Enzymes: No results for input(s): CKTOTAL, CKMB, CKMBINDEX, TROPONINI in the last 168 hours. BNP (last 3 results) No results for input(s): PROBNP in the last 8760 hours. HbA1C: No results for input(s): HGBA1C in the last 72 hours. CBG: No results for input(s): GLUCAP in the last 168 hours. Lipid Profile: No results for input(s): CHOL, HDL, LDLCALC, TRIG, CHOLHDL, LDLDIRECT in the  last 72 hours. Thyroid Function Tests: No results for input(s): TSH, T4TOTAL, FREET4, T3FREE, THYROIDAB in the last 72 hours. Anemia Panel: No results for input(s): VITAMINB12, FOLATE, FERRITIN, TIBC, IRON, RETICCTPCT in the last 72 hours. Sepsis Labs: Recent Labs  Lab 03/13/20 1632 03/13/20 2015 03/14/20 0330 03/15/20 0442  PROCALCITON  --  <0.10 <0.10 <0.10  LATICACIDVEN 1.5  --   --   --     Recent Results (from the past 240 hour(s))  Respiratory Panel by RT PCR (Flu A&B, Covid) - Nasopharyngeal Swab     Status: Abnormal   Collection Time: 03/13/20  4:32 PM   Specimen: Nasopharyngeal Swab  Result Value Ref Range Status   SARS Coronavirus 2 by RT PCR POSITIVE (A) NEGATIVE Final    Comment: RESULT CALLED TO, READ BACK BY AND VERIFIED WITH: S.WEST AT 1830 ON 03/13/20 BY N.THOMPSON (NOTE) SARS-CoV-2 target nucleic acids are DETECTED.  SARS-CoV-2 RNA is generally detectable in upper respiratory specimens  during the acute phase of infection. Positive results are indicative of the presence of the identified virus, but do not rule out bacterial infection or co-infection with other pathogens not detected by the test. Clinical correlation with patient history and other diagnostic information is necessary to determine patient infection status. The expected result is Negative.  Fact Sheet for Patients:  https://www.moore.com/  Fact Sheet for Healthcare Providers: https://www.young.biz/  This test is not yet approved or cleared by the [place] FDA and  has been authorized for detection and/or diagnosis of SARS-CoV-2 by FDA under an Emergency Use Authorization (EUA).  This EUA will remain in effect (meaning this test ca n be used) for the duration of  the COVID-19 declaration under Section 564(b)(1) of the Act, 21 U.S.C. section 360bbb-3(b)(1), unless the authorization is terminated or revoked sooner.      Influenza A by PCR NEGATIVE  NEGATIVE Final   Influenza B by PCR NEGATIVE NEGATIVE Final    Comment: (NOTE) The Xpert Xpress SARS-CoV-2/FLU/RSV assay is intended as an aid in  the diagnosis of influenza from Nasopharyngeal swab specimens and  should not be used as a sole basis for treatment. Nasal washings and  aspirates are unacceptable for Xpert Xpress SARS-CoV-2/FLU/RSV  testing.  Fact Sheet for Patients: https://www.moore.com/  Fact Sheet for Healthcare Providers: https://www.young.biz/  This test is not yet approved or cleared by the [place] FDA and  has been authorized for detection and/or diagnosis of SARS-CoV-2 by  FDA under an Emergency Use Authorization (EUA). This EUA will remain  in effect (meaning this test can be used) for the duration of the  Covid-19 declaration under Section 564(b)(1) of the Act, 21  U.S.C. section 360bbb-3(b)(1), unless the authorization is  terminated or revoked. Performed at [facility], [address], [place], [place] [address]          Radiology Studies: No results found.      Scheduled Meds: . vitamin C  500 mg Oral Daily  . aspirin  81 mg Oral Daily  . atorvastatin  20 mg Oral Daily  . cholecalciferol  1,000 Units Oral Daily  . enoxaparin (LOVENOX) injection  50 mg Subcutaneous Q24H  . feeding supplement (ENSURE ENLIVE)  237 mL Oral BID BM  . levothyroxine  150 mcg Oral Once per day on Sun Sat  . levothyroxine  175 mcg Oral Once per day on Mon Tue Wed Thu Fri  . losartan  50 mg Oral Daily  . methylPREDNISolone (SOLU-MEDROL) injection  60 mg Intravenous Q12H  . pantoprazole  40 mg Oral Daily  . zinc sulfate  220 mg Oral Daily   Continuous Infusions:    LOS: 5 days    Time spent: 35 minutes .    Dorcas Carrow, MD [facility] Pager [phone]

## 2020-03-19 ENCOUNTER — Other Ambulatory Visit (HOSPITAL_COMMUNITY): Payer: Self-pay | Admitting: Internal Medicine

## 2020-03-19 MED ORDER — GUAIFENESIN-DM 100-10 MG/5ML PO SYRP: 10.0000 mL | ORAL_SOLUTION | ORAL | 0 refills | Status: DC | PRN

## 2020-03-19 MED ORDER — ALBUTEROL SULFATE HFA 108 (90 BASE) MCG/ACT IN AERS: 2.0000 | INHALATION_SPRAY | RESPIRATORY_TRACT | 1 refills | Status: DC | PRN

## 2020-03-19 MED ORDER — PREDNISONE 10 MG PO TABS: ORAL_TABLET | ORAL | 0 refills | Status: DC

## 2020-03-19 MED FILL — ALBUTEROL SULFATE HFA 108 (: 108 (90 BAS | 17 days supply | Qty: 9 | Fill #0

## 2020-03-19 MED FILL — predniSONE 10 MG TABS: 10 | 8 days supply | Qty: 20 | Fill #0

## 2020-03-19 NOTE — TOC Transition Note (Signed)
Transition of Care [facility]) - CM/SW Discharge Note   Patient Details  Name: Nina Pitts MRN: [id number] Date of Birth: 19-Jul-1960  [facility] CM/SW Contact:  Ida Rogue, LCSW Phone Number: 03/19/2020, 2:40 PM   Clinical Narrative:   Patient who is scheduled for d/c today is in need of home O2, has a ride.  SAT note and orders seen and appreciated.  Contacted Zach with [employer] who will arrange for travel unit to be delivered to patient's room, home unit to home. No further needs identified.  TOC sign off.    Final next level of care: Home/Self Care Barriers to Discharge: No Barriers Identified   Patient Goals and CMS Choice        Discharge Placement                       Discharge Plan and Services                                     Social Determinants of Health (SDOH) Interventions     Readmission Risk Interventions No flowsheet data found.

## 2020-03-19 NOTE — Progress Notes (Signed)
Currently waiting on oxygen for home use for this patient. Discharge will occur later this evening.

## 2020-03-19 NOTE — Care Management Important Message (Signed)
Important Message  Patient Details IM Letter given to the Patient Name: Nina Pitts MRN: [id number] Date of Birth: 1960/12/12   Medicare Important Message Given:  Yes     Caren Macadam 03/19/2020, 12:25 PM

## 2020-03-19 NOTE — Progress Notes (Signed)
Patient will be discharging home with oxygen with family later this afternoon. Her belongings were returned. Education on medications will be provided.

## 2020-03-19 NOTE — Discharge Summary (Signed)
Physician Discharge Summary  Nina Pitts UJW:[id number] DOB: 20-Oct-1960 DOA: 03/13/2020  PCP: Merri Brunette, MD  Admit date: 03/13/2020 Discharge date: 03/19/2020  Admitted From: Home Disposition: Home  Recommendations for Outpatient Follow-up:  1. Follow up with PCP in 1-2 weeks  Home Health: Not applicable Equipment/Devices: Supplemental oxygen  Discharge Condition: Stable CODE STATUS: Full code Diet recommendation: Low-salt diet  Brief/Interim Summary: 59 year old female with history of hypothyroidism, hypertension, GERD and hyperlipidemia, not taken vaccine against COVID-19 presented to the ER sent from PCP office with hypoxia.  In the emergency room positive for COVID-19.  CT scan without PE.  Showed bilateral infiltrates with incidental finding of substantial emphysematous lungs.  Patient was admitted to hospital with COVID-19 pneumonia and hypoxemia, initially requiring 5 L of oxygen.  She was treated aggressively with supportive treatment as well as high-dose steroid therapy, completed 5 days of remdesivir.  CTA of the chest was negative for PE.  Lower extremity duplexes were negative for DVT. Patient has history of smoking, currently quit.  CT scan shows significant evidence of emphysematous lungs.  Currently symptomatically improved. With adequate improvement, patient will go home with tapering dose of steroids, she will be prescribed albuterol inhaler to use at home.  We will also send referral to pulmonary clinic to evaluate formally with pulmonary function test.  Patient is still using some supplemental oxygen, hence will be discharged home with oxygen and outpatient follow-up.  She has multiple other medical issues that are stable and she will continue to take all her home medications. Covid isolation precautions, benefits of COVID-19 vaccinations discussed in details and counseling provided.   Discharge Diagnoses:  Principal Problem:   COVID-19 virus infection Active  Problems:   Hypothyroidism   GERD without esophagitis   Essential hypertension   Mixed hyperlipidemia   Acute respiratory failure with hypoxia (HCC)   Emphysema of lung [facility])    Discharge Instructions  Discharge Instructions    Ambulatory referral to Pulmonology   Complete by: As directed    Post COVID follow up. Also needs pulmonary function test for possible COPD   Reason for referral: Asthma/COPD   Call MD for:  difficulty breathing, headache or visual disturbances   Complete by: As directed    Call MD for:  extreme fatigue   Complete by: As directed    Call MD for:  temperature >100.4   Complete by: As directed    Diet - low sodium heart healthy   Complete by: As directed    Discharge instructions   Complete by: As directed    Can use over-the-counter cough medications and Tylenol as needed. Total isolation duration 3 weeks since your parts positive test, 9/28-10/19 Highly encourage you to take COVID-19 vaccinations once you have finished your isolation. We will refer you to a pulmonary clinic for follow-up regarding new diagnosis of COPD.   Increase activity slowly   Complete by: As directed      Allergies as of 03/19/2020      Reactions   Percocet [oxycodone-acetaminophen] Nausea And Vomiting   Aspirin Nausea And Vomiting   Adult strength only. Patient stated that she can take lower doses of aspirin   Chocolate Hives   Robaxin [methocarbamol] Itching   Vicodin [hydrocodone-acetaminophen] Itching   Tramadol Other (See Comments)   Has bad headaches after taking      Medication List    STOP taking these medications   diphenhydrAMINE 25 MG tablet Commonly known as: Benadryl   docusate sodium 100  MG capsule Commonly known as: COLACE   naproxen 500 MG tablet Commonly known as: NAPROSYN   olopatadine 0.1 % ophthalmic solution Commonly known as: Patanol   promethazine 12.5 MG tablet Commonly known as: PHENERGAN     TAKE these medications   acetaminophen  500 MG tablet Commonly known as: TYLENOL Take 1,000 mg by mouth every 6 (six) hours as needed for moderate pain or headache.   albuterol 108 (90 Base) MCG/ACT inhaler Commonly known as: VENTOLIN HFA Inhale 2 puffs into the lungs every 4 (four) hours as needed for wheezing or shortness of breath.   aspirin 81 MG chewable tablet Chew 81 mg by mouth daily.   atorvastatin 20 MG tablet Commonly known as: LIPITOR Take 20 mg by mouth daily.   cholecalciferol 25 MCG (1000 UNIT) tablet Commonly known as: VITAMIN D3 Take 1,000 Units by mouth daily.   clonazePAM 0.5 MG tablet Commonly known as: KLONOPIN Take 0.25-0.5 mg by mouth 2 (two) times daily as needed for anxiety.   guaiFENesin-dextromethorphan 100-10 MG/5ML syrup Commonly known as: ROBITUSSIN DM Take 10 mLs by mouth every 4 (four) hours as needed for cough.   ibuprofen 800 MG tablet Commonly known as: ADVIL Take 1 tablet (800 mg total) by mouth 3 (three) times daily. What changed:   when to take this  reasons to take this   levothyroxine 150 MCG tablet Commonly known as: SYNTHROID Take 150 mcg by mouth daily before breakfast. Saturday &Sunday   levothyroxine 175 MCG tablet Commonly known as: SYNTHROID Take 175 mcg by mouth See admin instructions. Monday through Friday   losartan 50 MG tablet Commonly known as: COZAAR Take 50 mg by mouth daily.   metaxalone 800 MG tablet Commonly known as: SKELAXIN Take 800 mg by mouth 3 (three) times daily as needed for pain.   multivitamin tablet Take 1 tablet by mouth daily.   pantoprazole 40 MG tablet Commonly known as: PROTONIX Take 40 mg by mouth daily.   predniSONE 10 MG tablet Commonly known as: DELTASONE 4 tabs daily for 2 days 3 tabs daily for 2 days 2 tabs daily for 2 days 1 tab daily for 2 days   tiZANidine 4 MG tablet Commonly known as: ZANAFLEX Take 4 mg by mouth every 6 (six) hours as needed for muscle spasms.            Durable Medical Equipment   (From admission, onward)         Start     Ordered   03/18/20 1703  For home use only DME oxygen  Once       Comments: Patient Saturations on Room Air at Rest = 85 %  Patient Saturations on Room Air while Ambulating 80%  Patient Saturations on 6 Liters of oxygen while Ambulating = 91%  Question Answer Comment  Length of Need 6 Months   Mode or (Route) Nasal cannula   Liters per Minute 6   Frequency Continuous (stationary and portable oxygen unit needed)   Oxygen conserving device Yes   Oxygen delivery system Gas      10 /03/21 1703          Follow-up Information    Merri Brunette, MD Follow up in 2 week(s).   Specialty: Internal Medicine Contact information: [address] [place] [place] [address] [phone]              Allergies  Allergen Reactions  . Percocet [Oxycodone-Acetaminophen] Nausea And Vomiting  . Aspirin Nausea And Vomiting  Adult strength only. Patient stated that she can take lower doses of aspirin  . Chocolate Hives  . Robaxin [Methocarbamol] Itching  . Vicodin [Hydrocodone-Acetaminophen] Itching  . Tramadol Other (See Comments)    Has bad headaches after taking    Consultations:  None   Procedures/Studies: CT Angio Chest PE W and/or Wo Contrast  Result Date: 03/13/2020 CLINICAL DATA:  Shortness of breath with congestion and cough. EXAM: CT ANGIOGRAPHY CHEST WITH CONTRAST TECHNIQUE: Multidetector CT imaging of the chest was performed using the standard protocol during bolus administration of intravenous contrast. Multiplanar CT image reconstructions and MIPs were obtained to evaluate the vascular anatomy. CONTRAST:  100mL OMNIPAQUE IOHEXOL 350 MG/ML SOLN COMPARISON:  None. FINDINGS: Cardiovascular: Satisfactory opacification of the pulmonary arteries to the segmental level. No evidence of pulmonary embolism. There is mild cardiomegaly. No pericardial effusion. Mediastinum/Nodes: There is mild mediastinal and bilateral hilar  lymphadenopathy. The trachea and esophagus demonstrate no significant findings. Lungs/Pleura: There is marked severity emphysematous lung disease. Marked severity, predominately peripheral multifocal infiltrates are seen, bilaterally. There is a small left pleural effusion. No pneumothorax is identified Upper Abdomen: Multiple surgical clips are seen within the gallbladder fossa. Noninflamed diverticula are seen along the splenic flexure. Musculoskeletal: No chest wall abnormality. No acute or significant osseous findings. Review of the MIP images confirms the above findings. IMPRESSION: 1. Marked severity, predominately peripheral bilateral multifocal infiltrates. 2. Small left pleural effusion. 3. Marked severity emphysematous lung disease. 4. Colonic diverticulosis. Emphysema (ICD10-J43.9). Electronically Signed   By: Aram Candelahaddeus  Houston M.D.   On: 03/13/2020 20:25   DG Chest Portable 1 View  Result Date: 03/13/2020 CLINICAL DATA:  Cough and congestion EXAM: PORTABLE CHEST 1 VIEW COMPARISON:  February 08, 2013. FINDINGS: There is multifocal airspace opacity, primarily in the mid and lower lung regions. There is mild consolidation in the right base. Heart is upper normal in size with pulmonary vascularity normal. No adenopathy. No bone lesions. IMPRESSION: Multifocal airspace opacity. Appearance most consistent with multifocal pneumonia. Atypical organism pneumonia may present in this manner. Given this appearance, correlation with COVID-19 status advised. Heart size is upper normal.  No adenopathy evident. Electronically Signed   By: Bretta BangWilliam  Woodruff III M.D.   On: 03/13/2020 15:17   VAS US LOWER EXTREMITY VENOUS (DVT)  Result Date: 03/15/2020  Lower Venous DVTStudy Indications: Elevated d-dimer, and SOB.  Anticoagulation: Lovenox. Comparison Study: No prior studies. Performing Technologist: Jean Rosenthalachel Hodge  Examination Guidelines: A complete evaluation includes B-mode imaging, spectral Doppler, color Doppler,  and power Doppler as needed of all accessible portions of each vessel. Bilateral testing is considered an integral part of a complete examination. Limited examinations for reoccurring indications may be performed as noted. The reflux portion of the exam is performed with the patient in reverse Trendelenburg.  +---------+---------------+---------+-----------+----------+--------------+ RIGHT    CompressibilityPhasicitySpontaneityPropertiesThrombus Aging +---------+---------------+---------+-----------+----------+--------------+ CFV      Full           Yes      Yes                                 +---------+---------------+---------+-----------+----------+--------------+ SFJ      Full                                                        +---------+---------------+---------+-----------+----------+--------------+  FV Prox  Full                                                        +---------+---------------+---------+-----------+----------+--------------+ FV Mid   Full                                                        +---------+---------------+---------+-----------+----------+--------------+ FV DistalFull                                                        +---------+---------------+---------+-----------+----------+--------------+ PFV      Full                                                        +---------+---------------+---------+-----------+----------+--------------+ POP      Full           Yes      Yes                                 +---------+---------------+---------+-----------+----------+--------------+ PTV      Full                                                        +---------+---------------+---------+-----------+----------+--------------+ PERO     Full                                                        +---------+---------------+---------+-----------+----------+--------------+    +---------+---------------+---------+-----------+----------+--------------+ LEFT     CompressibilityPhasicitySpontaneityPropertiesThrombus Aging +---------+---------------+---------+-----------+----------+--------------+ CFV      Full           Yes      Yes                                 +---------+---------------+---------+-----------+----------+--------------+ SFJ      Full                                                        +---------+---------------+---------+-----------+----------+--------------+ FV Prox  Full                                                        +---------+---------------+---------+-----------+----------+--------------+  FV Mid   Full                                                        +---------+---------------+---------+-----------+----------+--------------+ FV DistalFull                                                        +---------+---------------+---------+-----------+----------+--------------+ PFV      Full                                                        +---------+---------------+---------+-----------+----------+--------------+ POP      Full           Yes      Yes                                 +---------+---------------+---------+-----------+----------+--------------+ PTV      Full                                                        +---------+---------------+---------+-----------+----------+--------------+ PERO     Full                                                        +---------+---------------+---------+-----------+----------+--------------+     Summary: RIGHT: - There is no evidence of deep vein thrombosis in the lower extremity.  - No cystic structure found in the popliteal fossa.  LEFT: - There is no evidence of deep vein thrombosis in the lower extremity.  - No cystic structure found in the popliteal fossa.  *See table(s) above for measurements and observations. Electronically signed  by Sherald Hess MD on 03/15/2020 at 2:12:41 PM.    Final    (Echo, Carotid, EGD, Colonoscopy, ERCP)    Subjective: Patient seen and examined.  She has some cough on deep breathing otherwise denies any complaints.  She was able to mobilize around with 5 to 6 L of oxygen.  Feels comfortable at rest.  Eager to go home.   Discharge Exam: Vitals:   03/19/20 0513 03/19/20 1350  BP: 117/65 116/74  Pulse: (!) 51 61  Resp: 18 20  Temp: 98.3 F (36.8 C) 98.2 F (36.8 C)  SpO2: 94% 97%   Vitals:   03/18/20 1427 03/18/20 2121 03/19/20 0513 03/19/20 1350  BP: 122/78 126/72 117/65 116/74  Pulse: 75 62 (!) 51 61  Resp: Temp: 98.7 F (37.1 C) 98.4 F (36.9 C) 98.3 F (36.8 C) 98.2 F (36.8 C)  TempSrc: Oral  Oral Oral  SpO2: 91% 97% 94% 97%  Weight:  Height:        General: Pt is alert, awake, not in acute distress Cardiovascular: RRR, S1/S2 +, no rubs, no gallops Respiratory: CTA bilaterally, no wheezing, no rhonchi, no added sounds.  Comfortable on 5 L oxygen.  Saturating 97% at rest. Abdominal: Soft, NT, ND, bowel sounds +, obese and pendulous. Extremities: no edema, no cyanosis    The results of significant diagnostics from this hospitalization (including imaging, microbiology, ancillary and laboratory) are listed below for reference.     Microbiology: Recent Results (from the past 240 hour(s))  Respiratory Panel by RT PCR (Flu A&B, Covid) - Nasopharyngeal Swab     Status: Abnormal   Collection Time: 03/13/20  4:32 PM   Specimen: Nasopharyngeal Swab  Result Value Ref Range Status   SARS Coronavirus 2 by RT PCR POSITIVE (A) NEGATIVE Final    Comment: RESULT CALLED TO, READ BACK BY AND VERIFIED WITH: S.WEST AT 1830 ON 03/13/20 BY N.THOMPSON (NOTE) SARS-CoV-2 target nucleic acids are DETECTED.  SARS-CoV-2 RNA is generally detectable in upper respiratory specimens  during the acute phase of infection. Positive results are indicative of the presence of  the identified virus, but do not rule out bacterial infection or co-infection with other pathogens not detected by the test. Clinical correlation with patient history and other diagnostic information is necessary to determine patient infection status. The expected result is Negative.  Fact Sheet for Patients:  https://www.moore.com/  Fact Sheet for Healthcare Providers: https://www.young.biz/  This test is not yet approved or cleared by the [place] FDA and  has been authorized for detection and/or diagnosis of SARS-CoV-2 by FDA under an Emergency Use Authorization (EUA).  This EUA will remain in effect (meaning this test ca n be used) for the duration of  the COVID-19 declaration under Section 564(b)(1) of the Act, 21 U.S.C. section 360bbb-3(b)(1), unless the authorization is terminated or revoked sooner.      Influenza A by PCR NEGATIVE NEGATIVE Final   Influenza B by PCR NEGATIVE NEGATIVE Final    Comment: (NOTE) The Xpert Xpress SARS-CoV-2/FLU/RSV assay is intended as an aid in  the diagnosis of influenza from Nasopharyngeal swab specimens and  should not be used as a sole basis for treatment. Nasal washings and  aspirates are unacceptable for Xpert Xpress SARS-CoV-2/FLU/RSV  testing.  Fact Sheet for Patients: https://www.moore.com/  Fact Sheet for Healthcare Providers: https://www.young.biz/  This test is not yet approved or cleared by the [place] FDA and  has been authorized for detection and/or diagnosis of SARS-CoV-2 by  FDA under an Emergency Use Authorization (EUA). This EUA will remain  in effect (meaning this test can be used) for the duration of the  Covid-19 declaration under Section 564(b)(1) of the Act, 21  U.S.C. section 360bbb-3(b)(1), unless the authorization is  terminated or revoked. Performed at [facility], [address] [place], [place]  [address]      Labs: BNP (last 3 results) No results for input(s): BNP in the last 8760 hours. Basic Metabolic Panel: Recent Labs  Lab 03/14/20 0330 03/15/20 0442 03/16/20 0441 03/17/20 0546 03/18/20 0531  NA 144 141 141 142 138  K 4.4 4.2 4.4 4.3 4.4  CL 106 107 105 103 101  CO2 GLUCOSE 114* 120* 124* 107* 121*  BUN 6 12 21* 20 22*  CREATININE 0.63 0.64 0.72 0.58 0.61  CALCIUM 9.2 9.4 8.8* 9.4 9.0  MG 2.2 2.3 2.3 2.5* 2.5*   Liver  Function Tests: Recent Labs  Lab 03/14/20 0330 03/15/20 0442 03/16/20 0441 03/17/20 0546 03/18/20 0531  AST 15 13* 14* 17 15  ALT 14 14 15 17 17   ALKPHOS 62 59 56 61 53  BILITOT 0.2* 0.6 0.6 0.7 0.7  PROT 8.1 7.7 7.5 7.4 7.2  ALBUMIN 3.0* 3.1* 3.2* 3.0* 3.0*   No results for input(s): LIPASE, AMYLASE in the last 168 hours. No results for input(s): AMMONIA in the last 168 hours. CBC: Recent Labs  Lab 03/14/20 0330 03/15/20 0442 03/16/20 0441 03/17/20 0546 03/18/20 0531  WBC 6.5 10.2 10.6* 10.4 7.9  NEUTROABS 5.2 8.0* 8.7* 8.3* 6.3  HGB 13.4 13.9 13.9 13.7 13.7  HCT 42.4 44.5 44.6 44.1 43.9  MCV 95.9 97.4 97.0 97.8 97.6  PLT 304 312 321 310 300   Cardiac Enzymes: No results for input(s): CKTOTAL, CKMB, CKMBINDEX, TROPONINI in the last 168 hours. BNP: Invalid input(s): POCBNP CBG: No results for input(s): GLUCAP in the last 168 hours. D-Dimer Recent Labs    03/17/20 0546 03/18/20 0531  DDIMER 1.03* 0.60*   Hgb A1c No results for input(s): HGBA1C in the last 72 hours. Lipid Profile No results for input(s): CHOL, HDL, LDLCALC, TRIG, CHOLHDL, LDLDIRECT in the last 72 hours. Thyroid function studies No results for input(s): TSH, T4TOTAL, T3FREE, THYROIDAB in the last 72 hours.  Invalid input(s): FREET3 Anemia work up No results for input(s): VITAMINB12, FOLATE, FERRITIN, TIBC, IRON, RETICCTPCT in the last 72 hours. Urinalysis    Component Value Date/Time   COLORURINE YELLOW 02/07/2013 2300    APPEARANCEUR CLEAR 02/07/2013 2300   LABSPEC 1.013 02/07/2013 2300   PHURINE 8.0 02/07/2013 2300   GLUCOSEU NEGATIVE 02/07/2013 2300   HGBUR NEGATIVE 02/07/2013 2300   BILIRUBINUR NEGATIVE 02/07/2013 2300   KETONESUR NEGATIVE 02/07/2013 2300   PROTEINUR NEGATIVE 02/07/2013 2300   UROBILINOGEN 1.0 02/07/2013 2300   NITRITE NEGATIVE 02/07/2013 2300   LEUKOCYTESUR NEGATIVE 02/07/2013 2300   Sepsis Labs Invalid input(s): PROCALCITONIN,  WBC,  LACTICIDVEN Microbiology Recent Results (from the past 240 hour(s))  Respiratory Panel by RT PCR (Flu A&B, Covid) - Nasopharyngeal Swab     Status: Abnormal   Collection Time: 03/13/20  4:32 PM   Specimen: Nasopharyngeal Swab  Result Value Ref Range Status   SARS Coronavirus 2 by RT PCR POSITIVE (A) NEGATIVE Final    Comment: RESULT CALLED TO, READ BACK BY AND VERIFIED WITH: S.WEST AT 1830 ON 03/13/20 BY N.THOMPSON (NOTE) SARS-CoV-2 target nucleic acids are DETECTED.  SARS-CoV-2 RNA is generally detectable in upper respiratory specimens  during the acute phase of infection. Positive results are indicative of the presence of the identified virus, but do not rule out bacterial infection or co-infection with other pathogens not detected by the test. Clinical correlation with patient history and other diagnostic information is necessary to determine patient infection status. The expected result is Negative.  Fact Sheet for Patients:  https://www.moore.com/  Fact Sheet for Healthcare Providers: https://www.young.biz/  This test is not yet approved or cleared by the [place] FDA and  has been authorized for detection and/or diagnosis of SARS-CoV-2 by FDA under an Emergency Use Authorization (EUA).  This EUA will remain in effect (meaning this test ca n be used) for the duration of  the COVID-19 declaration under Section 564(b)(1) of the Act, 21 U.S.C. section 360bbb-3(b)(1), unless the authorization  is terminated or revoked sooner.      Influenza A by PCR NEGATIVE NEGATIVE Final   Influenza B by PCR  NEGATIVE NEGATIVE Final    Comment: (NOTE) The Xpert Xpress SARS-CoV-2/FLU/RSV assay is intended as an aid in  the diagnosis of influenza from Nasopharyngeal swab specimens and  should not be used as a sole basis for treatment. Nasal washings and  aspirates are unacceptable for Xpert Xpress SARS-CoV-2/FLU/RSV  testing.  Fact Sheet for Patients: https://www.moore.com/  Fact Sheet for Healthcare Providers: https://www.young.biz/  This test is not yet approved or cleared by the [place] FDA and  has been authorized for detection and/or diagnosis of SARS-CoV-2 by  FDA under an Emergency Use Authorization (EUA). This EUA will remain  in effect (meaning this test can be used) for the duration of the  Covid-19 declaration under Section 564(b)(1) of the Act, 21  U.S.C. section 360bbb-3(b)(1), unless the authorization is  terminated or revoked. Performed at [facility], [address] [place], [place] [address]      Time coordinating discharge:  40 minutes  SIGNED:   Dorcas Carrow, MD  [facility] 03/19/2020, 2:13 PM

## 2020-03-20 NOTE — Progress Notes (Signed)
Went over discharge instructions w/ pt. Pt verbalized understanding.  

## 2020-03-20 NOTE — Progress Notes (Signed)
PROGRESS NOTE    Nina Pitts  YWV:[id number] DOB: [birth year] DOA: 03/13/2020 PCP: Merri Brunette, MD    Brief Narrative:  Patient with multiple medical issues admitted with COVID-19 pneumonia and hypoxemia.  She was evaluated for discharge on 03/19/2020, however could not leave because of oxygen and availability.   Assessment & Plan:   Principal Problem:   COVID-19 virus infection Active Problems:   Hypothyroidism   GERD without esophagitis   Essential hypertension   Mixed hyperlipidemia   Acute respiratory failure with hypoxia (HCC)   Emphysema of lung (HCC)  Patient seen and examined.  No overnight events.  Unfortunately she could not leave last night because oxygen could not be delivered at home. I have reviewed her discharge instructions, examined her and deemed stable for discharge.   DVT prophylaxis: Lovenox subcu   Code Status: Full code Family Communication: Patient going home and talking to the family. Disposition Plan: Status is: Inpatient    Dispo:  Patient From: Home  Planned Disposition: Home  Expected discharge date: 03/20/20  Medically stable for discharge: Yes.         Consultants:   None  Procedures:   None  Antimicrobials:   Remdesivir, finished 5 days therapy.   Subjective: Patient seen and examined.  Comfortable.  No overnight events.  Objective: Vitals:   03/19/20 1945 03/19/20 2125 03/20/20 0543 03/20/20 0929  BP:  130/74 109/61 (!) 144/94  Pulse:  (!) 57 (!) 52 69  Resp:  18 18   Temp:   98.7 F (37.1 C)   TempSrc:   Oral   SpO2: 96% 96% 96%   Weight:      Height:        Intake/Output Summary (Last 24 hours) at 03/20/2020 1020 Last data filed at 03/20/2020 0513 Gross per 24 hour  Intake 480 ml  Output 903 ml  Net -423 ml   Filed Weights   03/14/20 1700  Weight: 99.4 kg    Examination:  General exam: Appears calm and comfortable  SpO2: 96 % O2 Flow Rate (L/min): 4 L/min  Respiratory system: Clear to  auscultation. Respiratory effort normal.  Poor inspiratory effort. Cardiovascular system: S1 & S2 heard, RRR. No JVD, murmurs, rubs, gallops or clicks. No pedal edema. Gastrointestinal system: Abdomen is nondistended, soft and nontender. No organomegaly or masses felt. Normal bowel sounds heard.  Obese pendulous. Central nervous system: Alert and oriented. No focal neurological deficits.   Data Reviewed: I have personally reviewed following labs and imaging studies  CBC: Recent Labs  Lab 03/14/20 0330 03/15/20 0442 03/16/20 0441 03/17/20 0546 03/18/20 0531  WBC 6.5 10.2 10.6* 10.4 7.9  NEUTROABS 5.2 8.0* 8.7* 8.3* 6.3  HGB 13.4 13.9 13.9 13.7 13.7  HCT 42.4 44.5 44.6 44.1 43.9  MCV 95.9 97.4 97.0 97.8 97.6  PLT 304 312 321 310 300   Basic Metabolic Panel: Recent Labs  Lab 03/14/20 0330 03/15/20 0442 03/16/20 0441 03/17/20 0546 03/18/20 0531  NA 144 141 141 142 138  K 4.4 4.2 4.4 4.3 4.4  CL 106 107 105 103 101  CO2 27 24 25 28 28   GLUCOSE 114* 120* 124* 107* 121*  BUN 6 12 21* 20 22*  CREATININE 0.63 0.64 0.72 0.58 0.61  CALCIUM 9.2 9.4 8.8* 9.4 9.0  MG 2.2 2.3 2.3 2.5* 2.5*   GFR: Estimated Creatinine Clearance: 87.9 mL/min (by C-G formula based on SCr of 0.61 mg/dL). Liver Function Tests: Recent Labs  Lab 03/14/20 0330 03/15/20 03/17/20  03/16/20 0441 03/17/20 0546 03/18/20 0531  AST 15 13* 14* 17 15  ALT 14 14 15 17 17   ALKPHOS 62 59 56 61 53  BILITOT 0.2* 0.6 0.6 0.7 0.7  PROT 8.1 7.7 7.5 7.4 7.2  ALBUMIN 3.0* 3.1* 3.2* 3.0* 3.0*   No results for input(s): LIPASE, AMYLASE in the last 168 hours. No results for input(s): AMMONIA in the last 168 hours. Coagulation Profile: No results for input(s): INR, PROTIME in the last 168 hours. Cardiac Enzymes: No results for input(s): CKTOTAL, CKMB, CKMBINDEX, TROPONINI in the last 168 hours. BNP (last 3 results) No results for input(s): PROBNP in the last 8760 hours. HbA1C: No results for input(s): HGBA1C in the  last 72 hours. CBG: No results for input(s): GLUCAP in the last 168 hours. Lipid Profile: No results for input(s): CHOL, HDL, LDLCALC, TRIG, CHOLHDL, LDLDIRECT in the last 72 hours. Thyroid Function Tests: No results for input(s): TSH, T4TOTAL, FREET4, T3FREE, THYROIDAB in the last 72 hours. Anemia Panel: No results for input(s): VITAMINB12, FOLATE, FERRITIN, TIBC, IRON, RETICCTPCT in the last 72 hours. Sepsis Labs: Recent Labs  Lab 03/13/20 1632 03/13/20 2015 03/14/20 0330 03/15/20 0442  PROCALCITON  --  <0.10 <0.10 <0.10  LATICACIDVEN 1.5  --   --   --     Recent Results (from the past 240 hour(s))  Respiratory Panel by RT PCR (Flu A&B, Covid) - Nasopharyngeal Swab     Status: Abnormal   Collection Time: 03/13/20  4:32 PM   Specimen: Nasopharyngeal Swab  Result Value Ref Range Status   SARS Coronavirus 2 by RT PCR POSITIVE (A) NEGATIVE Final    Comment: RESULT CALLED TO, READ BACK BY AND VERIFIED WITH: S.WEST AT 1830 ON 03/13/20 BY N.THOMPSON (NOTE) SARS-CoV-2 target nucleic acids are DETECTED.  SARS-CoV-2 RNA is generally detectable in upper respiratory specimens  during the acute phase of infection. Positive results are indicative of the presence of the identified virus, but do not rule out bacterial infection or co-infection with other pathogens not detected by the test. Clinical correlation with patient history and other diagnostic information is necessary to determine patient infection status. The expected result is Negative.  Fact Sheet for Patients:  03/15/20  Fact Sheet for Healthcare Providers: https://www.moore.com/  This test is not yet approved or cleared by the https://www.young.biz/ FDA and  has been authorized for detection and/or diagnosis of SARS-CoV-2 by FDA under an Emergency Use Authorization (EUA).  This EUA will remain in effect (meaning this test ca n be used) for the duration of  the COVID-19  declaration under Section 564(b)(1) of the Act, 21 U.S.C. section 360bbb-3(b)(1), unless the authorization is terminated or revoked sooner.      Influenza A by PCR NEGATIVE NEGATIVE Final   Influenza B by PCR NEGATIVE NEGATIVE Final    Comment: (NOTE) The Xpert Xpress SARS-CoV-2/FLU/RSV assay is intended as an aid in  the diagnosis of influenza from Nasopharyngeal swab specimens and  should not be used as a sole basis for treatment. Nasal washings and  aspirates are unacceptable for Xpert Xpress SARS-CoV-2/FLU/RSV  testing.  Fact Sheet for Patients: [place]  Fact Sheet for Healthcare Providers: https://www.moore.com/  This test is not yet approved or cleared by the https://www.young.biz/ FDA and  has been authorized for detection and/or diagnosis of SARS-CoV-2 by  FDA under an Emergency Use Authorization (EUA). This EUA will remain  in effect (meaning this test can be used) for the duration of the  Covid-19 declaration  under Section 564(b)(1) of the Act, 21  U.S.C. section 360bbb-3(b)(1), unless the authorization is  terminated or revoked. Performed at [facility], [address], [place], [place] [address]          Radiology Studies: No results found.      Scheduled Meds: . vitamin C  500 mg Oral Daily  . aspirin  81 mg Oral Daily  . atorvastatin  20 mg Oral Daily  . cholecalciferol  1,000 Units Oral Daily  . enoxaparin (LOVENOX) injection  50 mg Subcutaneous Q24H  . feeding supplement (ENSURE ENLIVE)  237 mL Oral BID BM  . levothyroxine  150 mcg Oral Once per day on Sun Sat  . levothyroxine  175 mcg Oral Once per day on Mon Tue Wed Thu Fri  . losartan  50 mg Oral Daily  . methylPREDNISolone (SOLU-MEDROL) injection  60 mg Intravenous Q12H  . pantoprazole  40 mg Oral Daily  . zinc sulfate  220 mg Oral Daily   Continuous Infusions:   LOS: 7 days    Time spent: 20 minutes    Dorcas Carrow,  MD [facility] Pager [phone]

## 2020-04-11 MED FILL — SYNTHROID 175 MCG TABLET: 175 | 28 days supply | Qty: 20 | Fill #8

## 2020-04-11 MED FILL — ATORVASTATIN 20 MG TABLET: 20 | 90 days supply | Qty: 45 | Fill #2

## 2020-04-18 DIAGNOSIS — H5203 Hypermetropia, bilateral: Secondary | ICD-10-CM | POA: Diagnosis not present

## 2020-04-20 DIAGNOSIS — U071 COVID-19: Secondary | ICD-10-CM | POA: Diagnosis not present

## 2020-04-20 DIAGNOSIS — J439 Emphysema, unspecified: Secondary | ICD-10-CM | POA: Diagnosis not present

## 2020-04-20 DIAGNOSIS — J9601 Acute respiratory failure with hypoxia: Secondary | ICD-10-CM | POA: Diagnosis not present

## 2020-04-23 DIAGNOSIS — E039 Hypothyroidism, unspecified: Secondary | ICD-10-CM | POA: Diagnosis not present

## 2020-04-23 DIAGNOSIS — E7800 Pure hypercholesterolemia, unspecified: Secondary | ICD-10-CM | POA: Diagnosis not present

## 2020-04-23 DIAGNOSIS — Z0000 Encounter for general adult medical examination without abnormal findings: Secondary | ICD-10-CM | POA: Diagnosis not present

## 2020-04-23 DIAGNOSIS — E559 Vitamin D deficiency, unspecified: Secondary | ICD-10-CM | POA: Diagnosis not present

## 2020-04-23 DIAGNOSIS — I10 Essential (primary) hypertension: Secondary | ICD-10-CM | POA: Diagnosis not present

## 2020-04-26 ENCOUNTER — Other Ambulatory Visit (HOSPITAL_COMMUNITY): Payer: Self-pay | Admitting: Registered Nurse

## 2020-04-26 DIAGNOSIS — U071 COVID-19: Secondary | ICD-10-CM | POA: Diagnosis not present

## 2020-04-26 DIAGNOSIS — R0902 Hypoxemia: Secondary | ICD-10-CM | POA: Diagnosis not present

## 2020-04-26 DIAGNOSIS — E039 Hypothyroidism, unspecified: Secondary | ICD-10-CM | POA: Diagnosis not present

## 2020-04-26 DIAGNOSIS — J1282 Pneumonia due to coronavirus disease 2019: Secondary | ICD-10-CM | POA: Diagnosis not present

## 2020-04-26 MED FILL — AEROCHAMBER: 30 days supply | Qty: 1 | Fill #0

## 2020-05-02 MED FILL — LOSARTAN POTASSIUM 50 MG TA: 50 | 90 days supply | Qty: 90 | Fill #1

## 2020-05-17 ENCOUNTER — Other Ambulatory Visit (HOSPITAL_COMMUNITY): Payer: Self-pay | Admitting: Orthopedic Surgery

## 2020-05-17 DIAGNOSIS — M4316 Spondylolisthesis, lumbar region: Secondary | ICD-10-CM | POA: Diagnosis not present

## 2020-05-17 MED FILL — tiZANidine HCL 4 MG TABS: 4 | 10 days supply | Qty: 40 | Fill #0

## 2020-05-20 DIAGNOSIS — J9601 Acute respiratory failure with hypoxia: Secondary | ICD-10-CM | POA: Diagnosis not present

## 2020-05-20 DIAGNOSIS — J439 Emphysema, unspecified: Secondary | ICD-10-CM | POA: Diagnosis not present

## 2020-05-20 DIAGNOSIS — U071 COVID-19: Secondary | ICD-10-CM | POA: Diagnosis not present

## 2020-05-22 ENCOUNTER — Other Ambulatory Visit (HOSPITAL_COMMUNITY): Payer: Self-pay | Admitting: Internal Medicine

## 2020-05-22 DIAGNOSIS — M545 Low back pain, unspecified: Secondary | ICD-10-CM | POA: Diagnosis not present

## 2020-05-22 DIAGNOSIS — M4316 Spondylolisthesis, lumbar region: Secondary | ICD-10-CM | POA: Insufficient documentation

## 2020-05-22 MED FILL — SYNTHROID 175 MCG TABLET: 175 | 28 days supply | Qty: 20 | Fill #0

## 2020-05-24 ENCOUNTER — Ambulatory Visit: Payer: Medicare Other | Admitting: Internal Medicine

## 2020-05-24 ENCOUNTER — Encounter: Payer: Self-pay | Admitting: Internal Medicine

## 2020-05-24 ENCOUNTER — Other Ambulatory Visit: Payer: Self-pay

## 2020-05-24 VITALS — BP 122/70 | HR 58 | Temp 97.2°F | Ht 64.0 in | Wt 233.2 lb

## 2020-05-24 DIAGNOSIS — U071 COVID-19: Secondary | ICD-10-CM | POA: Diagnosis not present

## 2020-05-24 DIAGNOSIS — J432 Centrilobular emphysema: Secondary | ICD-10-CM | POA: Diagnosis not present

## 2020-05-24 DIAGNOSIS — Z23 Encounter for immunization: Secondary | ICD-10-CM

## 2020-05-24 NOTE — Progress Notes (Signed)
Nina Pitts    [id number]    [birth year]  Primary Care Physician:Pharr, Zollie Beckers, MD  Referring Physician: Merri Brunette, MD [address] [place],  [place] [address] Reason for Consultation: shortness of breath Date of Consultation: 05/24/2020  Chief complaint:   Chief Complaint  Patient presents with  . Consult    Short of breath , in hospital for low oxygen sats, test positive for covid on 03/13/20     HPI: Nina Pitts is a 59 y.o. woman who was hospitalized for covid 19 pneumonia October. Was sick at home for 5 weeks before coming to the hospital. Treated with steroids and remdesevir, discharged on 4LNC. Feeling better now.  Denies cough chest pain, shortness of breath, wheezing. Doesn't walk around much at home.  Had pneumonia in 2017 treated as an outpatient, never been hospitalized for breathing prior to this.   Social history:  Occupation: previously worked in [employer], Personnel officer, and office work.  Exposures: lives at home with husband and mother, doing ok with ADLs without dyspnea.  Smoking history: former smoker, quit 2000  Social History   Occupational History  . Occupation: 3  Tobacco Use  . Smoking status: Former Smoker    Packs/day: 0.50    Years: 15.00    Pack years: 7.50    Types: Cigarettes    Quit date: 11/18/1998    Years since quitting: 21.5  . Smokeless tobacco: Never Used  Substance and Sexual Activity  . Alcohol use: No  . Drug use: No  . Sexual activity: Not Currently    Relevant family history:  Family History  Problem Relation Age of Onset  . Pancreatic cancer Father   . Esophageal cancer Father   . Cancer Father        esophagus, lungs  . Diabetes Mother   . Heart disease Mother   . COPD Mother   . Lung cancer Mother   . Cancer Paternal Grandmother        breast  . Asthma Daughter     Past Medical History:  Diagnosis Date  . Anxiety   . Arthritis    "joints" (02/03/2013)  . Carpal tunnel  syndrome   . Fatty liver disease, nonalcoholic dx'd "~ 0/6301"  . Fibromyalgia   . Gallstones   . GERD (gastroesophageal reflux disease)   . Gout attack ~ 2011  . Hypertension   . Hypothyroidism   . IBS (irritable bowel syndrome)   . Obesity   . Osteoarthritis of both knees   . Pneumonia   . PTSD (post-traumatic stress disorder)     Past Surgical History:  Procedure Laterality Date  . ABDOMINAL EXPLORATION SURGERY  04/27/2001   w/LOA/notes 04/27/2001 (02/03/2013)  . ABDOMINAL HYSTERECTOMY  ~ 2000   partial  . APPENDECTOMY  ~ 1974  . AXILLARY HIDRADENITIS EXCISION Bilateral 1990's   with stsg  . BILATERAL SALPINGOOPHORECTOMY Bilateral 04/27/2001   Abbagale Perch 04/27/2001 (02/03/2013)  . CARDIAC CATHETERIZATION  ~ 2005  . CESAREAN SECTION  1979; 1980; 1986  . CHOLECYSTECTOMY  02/03/2013  . CHOLECYSTECTOMY N/A 02/03/2013   Procedure: LAPAROSCOPIC CHOLECYSTECTOMY WITH INTRAOPERATIVE CHOLANGIOGRAM;  Surgeon: [name], MD;  Location: [facility];  Service: General;  Laterality: N/A;  . ERCP N/A 02/09/2013   Procedure: ENDOSCOPIC RETROGRADE CHOLANGIOPANCREATOGRAPHY (ERCP) with Stent Placement;  Surgeon: [name], MD;  Location: [facility] OR;  Service: Endoscopy;  Laterality: N/A;  . ESOPHAGOGASTRODUODENOSCOPY N/A 03/28/2013   Procedure: ESOPHAGOGASTRODUODENOSCOPY (EGD);  Surgeon: [name], MD;  Location: [facility];  Service: Endoscopy;  Laterality: N/A;  . HERNIA REPAIR  ~ 1974   [facility]   . INCISIONAL HERNIA REPAIR  10/17/2015  . INCISIONAL HERNIA REPAIR N/A 10/17/2015   Procedure: LAPAROSCOPIC INCISIONAL HERNIA WITH MESH ;  Surgeon: [name], MD;  Location: [facility] OR;  Service: General;  Laterality: N/A;  . INSERTION OF MESH N/A 10/17/2015   Procedure: INSERTION OF MESH;  Surgeon: [name], MD;  Location: [facility] OR;  Service: General;  Laterality: N/A;  . KNEE ARTHROSCOPY Right 1990'S  . PILONIDAL CYST EXCISION  2000?     Physical Exam: Blood pressure 122/70, pulse (!) 58,  temperature (!) 97.2 F (36.2 C), temperature source Temporal, height 5\' 4"  (1.626 m), weight 233 lb 3.2 oz (105.8 kg), SpO2 100 %. Gen:      No acute distress ENT:  no nasal polyps, mucus membranes moist Lungs:    No increased respiratory effort, symmetric chest wall excursion, clear to auscultation bilaterally, no wheezes or crackles CV:         Regular rate and rhythm; no murmurs, rubs, or gallops.  No pedal edema Abd:      Obese,+ bowel sounds; soft, non-tender; no distension MSK: no acute synovitis of DIP or PIP joints, no mechanics hands.  Skin:      Warm and dry; no rashes Neuro: normal speech, no focal facial asymmetry Psych: alert and oriented x3, normal mood and affect   Data Reviewed/Medical Decision Making:  Independent interpretation of tests: Imaging: . Review of patient's CT angio sept 2021 images revealed severe emphysema and bilateral peripheral predominant multifocal opacities consistent with covid 19 infection. The patient's images have been independently reviewed by me.    PFTs: None on file  Labs:  Lab Results  Component Value Date   WBC 7.9 03/18/2020   HGB 13.7 03/18/2020   HCT 43.9 03/18/2020   MCV 97.6 03/18/2020   PLT 300 03/18/2020   Lab Results  Component Value Date   NA 138 03/18/2020   K 4.4 03/18/2020   CL 101 03/18/2020   CO2 28 03/18/2020    Immunization status:  Immunization History  Administered Date(s) Administered  . Influenza-Unspecified 03/16/2018  . Pneumococcal Polysaccharide-23 02/23/2013    . I reviewed prior external note(s) from hospital stay. . I reviewed the result(s) of the labs and imaging as noted above.    Assessment:  Covid 19 infection Emphysema Hypoxemic respiratory failure History of tobacco use Need for influenza vaccination  Plan/Recommendations: Continue to abstain from cigarettes. Titrate down oxygen to [facility]. She was able to maintain over 90% with walking today, but says she drops down to 84% at home  still. She can monitor this at home and come in either here or to PCP off for desat study and discontinue oxygen in a couple of months. Declined covid vaccine. Will get Flu shot today.   Return to Care: Return if symptoms worsen or fail to improve.  [facility], MD Pulmonary and Critical Care Medicine [facility] Office:[phone]  CC: Durel Salts, MD

## 2020-05-24 NOTE — Patient Instructions (Addendum)
Follow up with Dr. Renne Crigler and can try again to discontinue oxygen in a couple of months.

## 2020-06-20 DIAGNOSIS — J9601 Acute respiratory failure with hypoxia: Secondary | ICD-10-CM | POA: Diagnosis not present

## 2020-06-20 DIAGNOSIS — U071 COVID-19: Secondary | ICD-10-CM | POA: Diagnosis not present

## 2020-06-20 DIAGNOSIS — J439 Emphysema, unspecified: Secondary | ICD-10-CM | POA: Diagnosis not present

## 2020-06-21 MED FILL — SYNTHROID 175 MCG TABLET: 175 | 28 days supply | Qty: 20 | Fill #1

## 2020-06-21 MED FILL — SYNTHROID 150 MCG TABLET: 150 | 28 days supply | Qty: 8 | Fill #4

## 2020-06-25 DIAGNOSIS — E039 Hypothyroidism, unspecified: Secondary | ICD-10-CM | POA: Diagnosis not present

## 2020-06-27 DIAGNOSIS — E7800 Pure hypercholesterolemia, unspecified: Secondary | ICD-10-CM | POA: Diagnosis not present

## 2020-06-27 DIAGNOSIS — E039 Hypothyroidism, unspecified: Secondary | ICD-10-CM | POA: Diagnosis not present

## 2020-06-27 DIAGNOSIS — U071 COVID-19: Secondary | ICD-10-CM | POA: Diagnosis not present

## 2020-06-27 DIAGNOSIS — N644 Mastodynia: Secondary | ICD-10-CM | POA: Diagnosis not present

## 2020-06-27 DIAGNOSIS — J1282 Pneumonia due to coronavirus disease 2019: Secondary | ICD-10-CM | POA: Diagnosis not present

## 2020-06-27 DIAGNOSIS — K219 Gastro-esophageal reflux disease without esophagitis: Secondary | ICD-10-CM | POA: Diagnosis not present

## 2020-06-27 DIAGNOSIS — I10 Essential (primary) hypertension: Secondary | ICD-10-CM | POA: Diagnosis not present

## 2020-06-27 DIAGNOSIS — Z0000 Encounter for general adult medical examination without abnormal findings: Secondary | ICD-10-CM | POA: Diagnosis not present

## 2020-06-27 DIAGNOSIS — I700 Atherosclerosis of aorta: Secondary | ICD-10-CM | POA: Diagnosis not present

## 2020-07-07 ENCOUNTER — Other Ambulatory Visit: Payer: Self-pay | Admitting: Internal Medicine

## 2020-07-07 DIAGNOSIS — N644 Mastodynia: Secondary | ICD-10-CM

## 2020-07-23 ENCOUNTER — Other Ambulatory Visit (HOSPITAL_COMMUNITY): Payer: Self-pay | Admitting: Internal Medicine

## 2020-07-23 MED FILL — ATORVASTATIN CALCIUM 20 MG: 20 | 90 days supply | Qty: 45 | Fill #0

## 2020-07-23 MED FILL — LOSARTAN POTASSIUM 50 MG TA: 50 | 30 days supply | Qty: 30 | Fill #2

## 2020-07-23 MED FILL — SYNTHROID 150 MCG TABLET: 150 | 28 days supply | Qty: 8 | Fill #5

## 2020-07-23 MED FILL — SYNTHROID 175 MCG TABLET: 175 | 28 days supply | Qty: 20 | Fill #2

## 2020-08-27 DIAGNOSIS — E039 Hypothyroidism, unspecified: Secondary | ICD-10-CM | POA: Diagnosis not present

## 2020-08-27 DIAGNOSIS — E7800 Pure hypercholesterolemia, unspecified: Secondary | ICD-10-CM | POA: Diagnosis not present

## 2020-08-27 DIAGNOSIS — R350 Frequency of micturition: Secondary | ICD-10-CM | POA: Diagnosis not present

## 2020-08-29 DIAGNOSIS — E039 Hypothyroidism, unspecified: Secondary | ICD-10-CM | POA: Diagnosis not present

## 2020-08-29 DIAGNOSIS — Z8616 Personal history of COVID-19: Secondary | ICD-10-CM | POA: Diagnosis not present

## 2020-08-29 DIAGNOSIS — E7800 Pure hypercholesterolemia, unspecified: Secondary | ICD-10-CM | POA: Diagnosis not present

## 2020-08-29 DIAGNOSIS — E559 Vitamin D deficiency, unspecified: Secondary | ICD-10-CM | POA: Diagnosis not present

## 2020-08-29 DIAGNOSIS — I10 Essential (primary) hypertension: Secondary | ICD-10-CM | POA: Diagnosis not present

## 2020-09-05 ENCOUNTER — Ambulatory Visit
Admission: RE | Admit: 2020-09-05 | Discharge: 2020-09-05 | Disposition: A | Discharge status: Home / Self Care | Payer: Medicare Other | Source: Ambulatory Visit | Attending: Internal Medicine | Admitting: Internal Medicine

## 2020-09-05 ENCOUNTER — Ambulatory Visit: Payer: Medicare Other

## 2020-09-05 ENCOUNTER — Other Ambulatory Visit: Payer: Self-pay

## 2020-09-05 DIAGNOSIS — N644 Mastodynia: Secondary | ICD-10-CM

## 2020-09-05 DIAGNOSIS — R922 Inconclusive mammogram: Secondary | ICD-10-CM | POA: Diagnosis not present

## 2020-09-06 ENCOUNTER — Other Ambulatory Visit: Payer: Medicare Other

## 2020-09-17 ENCOUNTER — Other Ambulatory Visit (HOSPITAL_COMMUNITY): Payer: Self-pay

## 2020-09-17 ENCOUNTER — Encounter: Payer: Self-pay | Admitting: Emergency Medicine

## 2020-09-17 ENCOUNTER — Ambulatory Visit
Admission: EM | Admit: 2020-09-17 | Discharge: 2020-09-17 | Disposition: A | Discharge status: Home / Self Care | Payer: Medicare Other | Attending: Emergency Medicine | Admitting: Emergency Medicine

## 2020-09-17 ENCOUNTER — Other Ambulatory Visit: Payer: Self-pay

## 2020-09-17 DIAGNOSIS — J301 Allergic rhinitis due to pollen: Secondary | ICD-10-CM

## 2020-09-17 DIAGNOSIS — J302 Other seasonal allergic rhinitis: Secondary | ICD-10-CM

## 2020-09-17 MED ORDER — ALBUTEROL SULFATE HFA 108 (90 BASE) MCG/ACT IN AERS
1.0000 | INHALATION_SPRAY | RESPIRATORY_TRACT | 0 refills | Status: AC | PRN
  Filled 2020-09-17: qty 8.5, 17d supply, fill #0

## 2020-09-17 MED ORDER — FLUTICASONE PROPIONATE 50 MCG/ACT NA SUSP
2.0000 | Freq: Every day | NASAL | 0 refills | Status: DC
  Filled 2020-09-17: qty 16, 30d supply, fill #0

## 2020-09-17 MED ORDER — AEROCHAMBER PLUS MISC
2 refills | Status: AC
  Filled 2020-09-17: qty 1, 30d supply, fill #0

## 2020-09-17 NOTE — ED Triage Notes (Signed)
Pt is present with nasal congestion and watery/itchey eyes. Pt states that her sx started Friday. PT

## 2020-09-17 NOTE — ED Provider Notes (Signed)
HPI  SUBJECTIVE:  Nina Pitts is a 60 y.o. female who presents with 2 days of nasal congestion, clear rhinorrhea, postnasal drip, sneezing, itchy, watery eyes when she goes outside.  She reports cough secondary to the postnasal drip.  She reports sinus pressure in the morning, which resolves during the day until she goes outside.  No fevers, body aches, headaches, sore throat, loss of sense of smell or taste, wheeze, shortness of breath, nausea, vomiting, diarrhea, abdominal pain.  No facial swelling, upper dental pain.  She has not tried anything for this.  No alleviating factors.  Symptoms are worse with going outside.  No exposure to Covid or flu.  She did not get the COVID or flu vaccine-states that she was told not to take the COVID vaccine by her PMD.  No antibiotics in the past month.  No antipyretic in the past 6 hours.  She has a past medical history of COVID x2 with 1 hospital admission, emphysema, allergies, hypertension.  She has not needed her albuterol inhaler.  No history of diabetes, chronic kidney disease.  She states that she was advised to seek medical care if she started having any upper respiratory infections to rule out bacterial sinusitis after her COVID admission.  TOI:ZTIWP, Zollie Beckers, MD   Past Surgical History:  Procedure Laterality Date  . ABDOMINAL EXPLORATION SURGERY  04/27/2001   w/LOA/notes 04/27/2001 (02/03/2013)  . ABDOMINAL HYSTERECTOMY  ~ 2000   partial  . APPENDECTOMY  ~ 1974  . AXILLARY HIDRADENITIS EXCISION Bilateral 1990's   with stsg  . BILATERAL SALPINGOOPHORECTOMY Bilateral 04/27/2001   [place]olina Perch 04/27/2001 (02/03/2013)  . CARDIAC CATHETERIZATION  ~ 2005  . CESAREAN SECTION  1979; 1980; 1986  . CHOLECYSTECTOMY  02/03/2013  . CHOLECYSTECTOMY N/A 02/03/2013   Procedure: LAPAROSCOPIC CHOLECYSTECTOMY WITH INTRAOPERATIVE CHOLANGIOGRAM;  Surgeon: [name], MD;  Location: [facility] OR;  Service: General;  Laterality: N/A;  . ERCP N/A 02/09/2013   Procedure:  ENDOSCOPIC RETROGRADE CHOLANGIOPANCREATOGRAPHY (ERCP) with Stent Placement;  Surgeon: [name], MD;  Location: [facility] OR;  Service: Endoscopy;  Laterality: N/A;  . ESOPHAGOGASTRODUODENOSCOPY N/A 03/28/2013   Procedure: ESOPHAGOGASTRODUODENOSCOPY (EGD);  Surgeon: [name], MD;  Location: [facility];  Service: Endoscopy;  Laterality: N/A;  . HERNIA REPAIR  ~ 1974   [facility]   . INCISIONAL HERNIA REPAIR  10/17/2015  . INCISIONAL HERNIA REPAIR N/A 10/17/2015   Procedure: LAPAROSCOPIC INCISIONAL HERNIA WITH MESH ;  Surgeon: [name], MD;  Location: [facility] OR;  Service: General;  Laterality: N/A;  . INSERTION OF MESH N/A 10/17/2015   Procedure: INSERTION OF MESH;  Surgeon: [name], MD;  Location: [facility] OR;  Service: General;  Laterality: N/A;  . KNEE ARTHROSCOPY Right 1990'S  . PILONIDAL CYST EXCISION  2000?    Family History  Problem Relation Age of Onset  . Pancreatic cancer Father   . Esophageal cancer Father   . Cancer Father        esophagus, lungs  . Diabetes Mother   . Heart disease Mother   . COPD Mother   . Lung cancer Mother   . Cancer Paternal Grandmother        breast  . Asthma Daughter     Social History   Tobacco Use  . Smoking status: Former Smoker    Packs/day: 0.50    Years: 15.00    Pack years: 7.50    Types: Cigarettes    Quit date: 11/18/1998    Years since quitting: 21.8  .  Smokeless tobacco: Never Used  Substance Use Topics  . Alcohol use: No  . Drug use: No    No current facility-administered medications for this encounter.  Current Outpatient Medications:  .  albuterol (VENTOLIN HFA) 108 (90 Base) MCG/ACT inhaler, Inhale 1-2 puffs into the lungs every 4 (four) hours as needed for wheezing or shortness of breath., Disp: 8.5 g, Rfl: 0 .  fluticasone (FLONASE) 50 MCG/ACT nasal spray, Place 2 sprays into both nostrils daily., Disp: 16 g, Rfl: 0 .  Spacer/Aero-Holding Chambers (AEROCHAMBER PLUS) inhaler, Use with inhaler, Disp: 1 each, Rfl: 2 .   acetaminophen (TYLENOL) 500 MG tablet, Take 1,000 mg by mouth every 6 (six) hours as needed for moderate pain or headache., Disp: , Rfl:  .  albuterol (VENTOLIN HFA) 108 (90 Base) MCG/ACT inhaler, INHALE 2 PUFFS INTO THE LUNGS EVERY 4 (FOUR) HOURS AS NEEDED FOR WHEEZING OR SHORTNESS OF BREATH., Disp: 8.5 g, Rfl: 1 .  aspirin 81 MG chewable tablet, Chew 81 mg by mouth daily., Disp: , Rfl:  .  atorvastatin (LIPITOR) 20 MG tablet, Take 20 mg by mouth daily. , Disp: , Rfl:  .  atorvastatin (LIPITOR) 20 MG tablet, TAKE 1/2 TABLET BY MOUTH ONCE A DAY, Disp: 45 tablet, Rfl: 2 .  atorvastatin (LIPITOR) 20 MG tablet, TAKE 1/2 TABLET BY MOUTH ONCE A DAY, Disp: 45 tablet, Rfl: 2 .  cholecalciferol (VITAMIN D3) 25 MCG (1000 UNIT) tablet, Take 1,000 Units by mouth daily., Disp: , Rfl:  .  clonazePAM (KLONOPIN) 0.5 MG tablet, Take 0.25-0.5 mg by mouth 2 (two) times daily as needed for anxiety. , Disp: , Rfl:  .  ibuprofen (ADVIL,MOTRIN) 800 MG tablet, Take 1 tablet (800 mg total) by mouth 3 (three) times daily. (Patient taking differently: Take 800 mg by mouth 3 (three) times daily as needed.), Disp: 30 tablet, Rfl: 0 .  levothyroxine (SYNTHROID) 150 MCG tablet, TAKE 1 TABLET BY MOUTH SATURDAY AND SUNDAY ONCE A DAY, Disp: 30 tablet, Rfl: 2 .  levothyroxine (SYNTHROID) 175 MCG tablet, TAKE 1 TABLET BY MOUTH ONCE A DAY MONDAY THROUGH FRIDAY., Disp: 20 tablet, Rfl: 7 .  levothyroxine (SYNTHROID, LEVOTHROID) 150 MCG tablet, Take 150 mcg by mouth daily before breakfast. Saturday &Sunday, Disp: , Rfl:  .  levothyroxine (SYNTHROID, LEVOTHROID) 175 MCG tablet, Take 175 mcg by mouth See admin instructions. Monday through Friday, Disp: , Rfl:  .  losartan (COZAAR) 50 MG tablet, Take 50 mg by mouth daily., Disp: , Rfl:  .  losartan (COZAAR) 50 MG tablet, TAKE 1 TABLET BY MOUTH ONCE DAILY, Disp: 90 tablet, Rfl: 2 .  Multiple Vitamin (MULTIVITAMIN) tablet, Take 1 tablet by mouth daily., Disp: , Rfl:  .  pantoprazole (PROTONIX)  40 MG tablet, Take 40 mg by mouth daily., Disp: , Rfl:  .  Spacer/Aero-Holding Chambers (AEROCHAMBER PLUS WITH MASK) inhaler, USE AS DIRECTED, Disp: 1 each, Rfl: 0 .  tiZANidine (ZANAFLEX) 4 MG tablet, Take 4 mg by mouth every 6 (six) hours as needed for muscle spasms., Disp: , Rfl:  .  tiZANidine (ZANAFLEX) 4 MG tablet, TAKE 1 TABLET BY MOUTH EVERY 6 HOURS, Disp: 40 tablet, Rfl: 1  Allergies  Allergen Reactions  . Percocet [Oxycodone-Acetaminophen] Nausea And Vomiting  . Aspirin Nausea And Vomiting    Adult strength only. Patient stated that she can take lower doses of aspirin  . Chocolate Hives  . Robaxin [Methocarbamol] Itching  . Vicodin [Hydrocodone-Acetaminophen] Itching  . Tramadol Other (See Comments)  Has bad headaches after taking     ROS  As noted in HPI.   Physical Exam  BP (!) 154/83 (BP Location: Left Arm)   Pulse (!) 58   Temp 98 F (36.7 C) (Oral)   Resp 18   SpO2 95%   Constitutional: Well developed, well nourished, no acute distress Eyes:  EOMI, conjunctiva normal bilaterally HENT: Normocephalic, atraumatic,mucus membranes moist.  Erythematous, swollen turbinates.  Clear nasal congestion.  No maxillary, frontal sinus tenderness.  No obvious postnasal drip. Respiratory: Normal inspiratory effort, lungs clear bilaterally, good air movement Cardiovascular: Normal rate GI: nondistended skin: No rash, skin intact Musculoskeletal: no deformities Neurologic: Alert & oriented x 3, no focal neuro deficits Psychiatric: Speech and behavior appropriate   ED Course   Medications - No data to display  No orders of the defined types were placed in this encounter.   No results found for this or any previous visit (from the past 24 hour(s)). No results found.  ED Clinical Impression  1. Seasonal allergies   2. Seasonal allergic rhinitis due to pollen      ED Assessment/Plan  Presentation consistent with allergic rhinitis/seasonal allergies.  Will start  Zyrtec, saline nasal irrigation, Flonase.  Will refill her albuterol inhaler and give her another spacer in case she needs it.  I do not think that she has a bacterial sinus infection or pneumonia at this time.  Do not think that Covid testing is necessary today.  Follow-up with PMD as needed  Discussed MDM, treatment plan, and plan for follow-up with patient.  patient agrees with plan.   Meds ordered this encounter  Medications  . fluticasone (FLONASE) 50 MCG/ACT nasal spray    Sig: Place 2 sprays into both nostrils daily.    Dispense:  16 g    Refill:  0  . albuterol (VENTOLIN HFA) 108 (90 Base) MCG/ACT inhaler    Sig: Inhale 1-2 puffs into the lungs every 4 (four) hours as needed for wheezing or shortness of breath.    Dispense:  8.5 g    Refill:  0  . Spacer/Aero-Holding Chambers (AEROCHAMBER PLUS) inhaler    Sig: Use with inhaler    Dispense:  1 each    Refill:  2    Please educate patient on use      *This clinic note was created using Dragon dictation software. Therefore, there may be occasional mistakes despite careful proofreading.  ?    Domenick Gong, MD 09/18/20 [phone]

## 2020-09-17 NOTE — Discharge Instructions (Addendum)
Saline nasal irrigation with a [facility] Med rinse and distilled water as often as you want.  Start Zyrtec.  Flonase will help with the nasal congestion, allergies and postnasal drip.  2 puffs from your albuterol inhaler using your spacer every 4-6 hours as needed.

## 2020-10-01 ENCOUNTER — Other Ambulatory Visit (HOSPITAL_COMMUNITY): Payer: Self-pay

## 2020-10-01 MED FILL — Losartan Potassium Tab 50 MG: ORAL | 30 days supply | Qty: 30 | Fill #0 | Status: AC

## 2020-10-01 MED FILL — Levothyroxine Sodium Tab 175 MCG: ORAL | 28 days supply | Qty: 20 | Fill #0 | Status: AC

## 2020-10-10 ENCOUNTER — Other Ambulatory Visit: Payer: Self-pay

## 2020-10-10 ENCOUNTER — Ambulatory Visit
Admission: EM | Admit: 2020-10-10 | Discharge: 2020-10-10 | Disposition: A | Discharge status: Home / Self Care | Payer: Medicare Other | Attending: Family Medicine | Admitting: Family Medicine

## 2020-10-10 ENCOUNTER — Other Ambulatory Visit (HOSPITAL_COMMUNITY): Payer: Self-pay

## 2020-10-10 ENCOUNTER — Encounter: Payer: Self-pay | Admitting: Emergency Medicine

## 2020-10-10 DIAGNOSIS — H6983 Other specified disorders of Eustachian tube, bilateral: Secondary | ICD-10-CM | POA: Diagnosis not present

## 2020-10-10 DIAGNOSIS — J029 Acute pharyngitis, unspecified: Secondary | ICD-10-CM | POA: Diagnosis not present

## 2020-10-10 DIAGNOSIS — J3089 Other allergic rhinitis: Secondary | ICD-10-CM | POA: Diagnosis not present

## 2020-10-10 DIAGNOSIS — Z20822 Contact with and (suspected) exposure to covid-19: Secondary | ICD-10-CM

## 2020-10-10 MED ORDER — FLUTICASONE PROPIONATE 50 MCG/ACT NA SUSP
2.0000 | Freq: Two times a day (BID) | NASAL | 2 refills | Status: AC
  Filled 2020-10-10: qty 16, 30d supply, fill #0

## 2020-10-10 NOTE — ED Provider Notes (Signed)
[name]    CSN: [id number] Arrival date & time: 10/10/20  1950      History   Chief Complaint Chief Complaint  Patient presents with  . Otalgia    HPI Nina Pitts is a 60 y.o. female.   Patient presenting today with 2-day history of scratchy throat, bilateral ear pain, pressure, popping.  Denies significant cough, fever, chills, body aches, chest pain, shortness of breath, abdominal pain, nausea vomiting diarrhea.  Does have a known history of seasonal allergies, states her symptoms get worse when she walks outside.  Was given some Flonase about 2 weeks ago which cleared up her symptoms at that time but stopped using it since.  Has not been taking anything over-the-counter for symptoms at this time.  No new sick contacts known.     Past Medical History:  Diagnosis Date  . Anxiety   . Arthritis    "joints" (02/03/2013)  . Carpal tunnel syndrome   . Fatty liver disease, nonalcoholic dx'd "~ 02/3266"  . Fibromyalgia   . Gallstones   . GERD (gastroesophageal reflux disease)   . Gout attack ~ 2011  . Hypertension   . Hypothyroidism   . IBS (irritable bowel syndrome)   . Obesity   . Osteoarthritis of both knees   . Pneumonia   . PTSD (post-traumatic stress disorder)     Patient Active Problem List   Diagnosis Date Noted  . COVID-19 virus infection 03/13/2020  . GERD without esophagitis 03/13/2020  . Essential hypertension 03/13/2020  . Mixed hyperlipidemia 03/13/2020  . Acute respiratory failure with hypoxia (HCC) 03/13/2020  . Emphysema of lung (HCC) 03/13/2020  . Incarcerated ventral hernia 10/17/2015  . Aortic atherosclerosis (HCC) 03/21/2015  . Fever 02/08/2013  . Abdominal pain 02/08/2013  . IBS (irritable bowel syndrome) 11/17/2012  . Hypothyroidism 11/17/2012  . Obesity, unspecified 11/17/2012    Past Surgical History:  Procedure Laterality Date  . ABDOMINAL EXPLORATION SURGERY  04/27/2001   w/LOA/notes 04/27/2001 (02/03/2013)  .  ABDOMINAL HYSTERECTOMY  ~ 2000   partial  . APPENDECTOMY  ~ 1974  . AXILLARY HIDRADENITIS EXCISION Bilateral 1990's   with stsg  . BILATERAL SALPINGOOPHORECTOMY Bilateral 04/27/2001   Ella Perch 04/27/2001 (02/03/2013)  . CARDIAC CATHETERIZATION  ~ 2005  . CESAREAN SECTION  1979; 1980; 1986  . CHOLECYSTECTOMY  02/03/2013  . CHOLECYSTECTOMY N/A 02/03/2013   Procedure: LAPAROSCOPIC CHOLECYSTECTOMY WITH INTRAOPERATIVE CHOLANGIOGRAM;  Surgeon: [name], MD;  Location: [facility] OR;  Service: General;  Laterality: N/A;  . ERCP N/A 02/09/2013   Procedure: ENDOSCOPIC RETROGRADE CHOLANGIOPANCREATOGRAPHY (ERCP) with Stent Placement;  Surgeon: [name], MD;  Location: [facility] OR;  Service: Endoscopy;  Laterality: N/A;  . ESOPHAGOGASTRODUODENOSCOPY N/A 03/28/2013   Procedure: ESOPHAGOGASTRODUODENOSCOPY (EGD);  Surgeon: [name], MD;  Location: [facility];  Service: Endoscopy;  Laterality: N/A;  . HERNIA REPAIR  ~ 1974   [facility]   . INCISIONAL HERNIA REPAIR  10/17/2015  . INCISIONAL HERNIA REPAIR N/A 10/17/2015   Procedure: LAPAROSCOPIC INCISIONAL HERNIA WITH MESH ;  Surgeon: [name], MD;  Location: [facility] OR;  Service: General;  Laterality: N/A;  . INSERTION OF MESH N/A 10/17/2015   Procedure: INSERTION OF MESH;  Surgeon: [name], MD;  Location: [facility] OR;  Service: General;  Laterality: N/A;  . KNEE ARTHROSCOPY Right 1990'S  . PILONIDAL CYST EXCISION  2000?    OB History   No obstetric history on file.      Home Medications    Prior to  Admission medications   Medication Sig Start Date End Date Taking? Authorizing Provider  acetaminophen (TYLENOL) 500 MG tablet Take 1,000 mg by mouth every 6 (six) hours as needed for moderate pain or headache.    [provider]  albuterol (VENTOLIN HFA) 108 (90 Base) MCG/ACT inhaler INHALE 2 PUFFS INTO THE LUNGS EVERY 4 (FOUR) HOURS AS NEEDED FOR WHEEZING OR SHORTNESS OF BREATH. 03/19/20 03/19/21  Dorcas Carrow, MD  albuterol (VENTOLIN HFA) 108  (90 Base) MCG/ACT inhaler Inhale 1-2 puffs into the lungs every 4 (four) hours as needed for wheezing or shortness of breath. 09/17/20   Domenick Gong, MD  aspirin 81 MG chewable tablet Chew 81 mg by mouth daily.    [provider]  atorvastatin (LIPITOR) 20 MG tablet Take 20 mg by mouth daily.  02/16/13   [provider]  atorvastatin (LIPITOR) 20 MG tablet TAKE 1/2 TABLET BY MOUTH ONCE A DAY 07/23/20 07/23/21  Merri Brunette, MD  atorvastatin (LIPITOR) 20 MG tablet TAKE 1/2 TABLET BY MOUTH ONCE A DAY 09/22/19 09/21/20  Merri Brunette, MD  cholecalciferol (VITAMIN D3) 25 MCG (1000 UNIT) tablet Take 1,000 Units by mouth daily.    [provider]  clonazePAM (KLONOPIN) 0.5 MG tablet Take 0.25-0.5 mg by mouth 2 (two) times daily as needed for anxiety.     [provider]  fluticasone (FLONASE) 50 MCG/ACT nasal spray Place 2 sprays into both nostrils in the morning and at bedtime. 10/10/20   Particia Nearing, PA-C  ibuprofen (ADVIL,MOTRIN) 800 MG tablet Take 1 tablet (800 mg total) by mouth 3 (three) times daily. Patient taking differently: Take 800 mg by mouth 3 (three) times daily as needed. 05/12/18   Mardella Layman, MD  levothyroxine (SYNTHROID) 150 MCG tablet TAKE 1 TABLET BY MOUTH SATURDAY AND SUNDAY ONCE A DAY 11/02/19 11/01/20  Merri Brunette, MD  levothyroxine (SYNTHROID) 175 MCG tablet TAKE 1 TABLET BY MOUTH ONCE A DAY MONDAY THROUGH FRIDAY. 05/22/20 05/22/21  Merri Brunette, MD  levothyroxine (SYNTHROID, LEVOTHROID) 150 MCG tablet Take 150 mcg by mouth daily before breakfast. Saturday &Sunday    [provider]  levothyroxine (SYNTHROID, LEVOTHROID) 175 MCG tablet Take 175 mcg by mouth See admin instructions. Monday through Friday    [provider]  losartan (COZAAR) 50 MG tablet Take 50 mg by mouth daily.    [provider]  losartan (COZAAR) 50 MG tablet TAKE 1 TABLET BY MOUTH ONCE DAILY 01/12/20 01/11/21  Merri Brunette, MD  Multiple Vitamin  (MULTIVITAMIN) tablet Take 1 tablet by mouth daily.    [provider]  pantoprazole (PROTONIX) 40 MG tablet Take 40 mg by mouth daily.    [provider]  Spacer/Aero-Holding Chambers (AEROCHAMBER PLUS WITH MASK) inhaler USE AS DIRECTED 04/26/20 04/26/21  Fatima Sanger, FNP  Spacer/Aero-Holding Chambers (AEROCHAMBER PLUS) inhaler Use with inhaler 09/17/20   Domenick Gong, MD  tiZANidine (ZANAFLEX) 4 MG tablet Take 4 mg by mouth every 6 (six) hours as needed for muscle spasms.    [provider]  tiZANidine (ZANAFLEX) 4 MG tablet TAKE 1 TABLET BY MOUTH EVERY 6 HOURS 05/17/20 05/17/21  Beverely Low, MD    Family History Family History  Problem Relation Age of Onset  . Pancreatic cancer Father   . Esophageal cancer Father   . Cancer Father        esophagus, lungs  . Diabetes Mother   . Heart disease Mother   . COPD Mother   . Lung cancer Mother   .  Cancer Paternal Grandmother        breast  . Asthma Daughter     Social History Social History   Tobacco Use  . Smoking status: Former Smoker    Packs/day: 0.50    Years: 15.00    Pack years: 7.50    Types: Cigarettes    Quit date: 11/18/1998    Years since quitting: 21.9  . Smokeless tobacco: Never Used  Substance Use Topics  . Alcohol use: No  . Drug use: No     Allergies   Percocet [oxycodone-acetaminophen], Aspirin, Chocolate, Robaxin [methocarbamol], Vicodin [hydrocodone-acetaminophen], and Tramadol   Review of Systems Review of Systems Per HPI Physical Exam Triage Vital Signs ED Triage Vitals  Enc Vitals Group     BP 10/10/20 0901 132/79     Pulse Rate 10/10/20 0901 74     Resp 10/10/20 0901 20     Temp 10/10/20 0901 98.2 F (36.8 C)     Temp Source 10/10/20 0901 Oral     SpO2 10/10/20 0901 94 %     Weight --      Height --      Head Circumference --      Peak Flow --      Pain Score 10/10/20 0904 4     Pain Loc --      Pain Edu? --      Excl. in GC? --    No data  found.  Updated Vital Signs BP 132/79 (BP Location: Left Arm)   Pulse 74   Temp 98.2 F (36.8 C) (Oral)   Resp 20   SpO2 94%   Visual Acuity Right Eye Distance:   Left Eye Distance:   Bilateral Distance:    Right Eye Near:   Left Eye Near:    Bilateral Near:     Physical Exam Vitals and nursing note reviewed.  Constitutional:      Appearance: Normal appearance. She is not ill-appearing.  HENT:     Head: Atraumatic.     Right Ear: Tympanic membrane normal.     Left Ear: Tympanic membrane normal.     Nose: Rhinorrhea present.     Mouth/Throat:     Mouth: Mucous membranes are moist.     Pharynx: Oropharynx is clear. Posterior oropharyngeal erythema present.  Eyes:     Extraocular Movements: Extraocular movements intact.     Conjunctiva/sclera: Conjunctivae normal.  Cardiovascular:     Rate and Rhythm: Normal rate and regular rhythm.     Heart sounds: Normal heart sounds.  Pulmonary:     Effort: Pulmonary effort is normal. No respiratory distress.     Breath sounds: Normal breath sounds. No wheezing or rales.  Abdominal:     General: Bowel sounds are normal. There is no distension.     Palpations: Abdomen is soft.     Tenderness: There is no abdominal tenderness. There is no guarding.  Musculoskeletal:        General: Normal range of motion.     Cervical back: Normal range of motion and neck supple.  Skin:    General: Skin is warm and dry.  Neurological:     Mental Status: She is alert and oriented to person, place, and time.  Psychiatric:        Mood and Affect: Mood normal.        Thought Content: Thought content normal.        Judgment: Judgment normal.      UC Treatments /  Results  Labs (all labs ordered are listed, but only abnormal results are displayed) Labs Reviewed  NOVEL CORONAVIRUS, NAA    EKG   Radiology No results found.  Procedures Procedures (including critical care time)  Medications Ordered in UC Medications - No data to  display  Initial Impression / Assessment and Plan / UC Course  I have reviewed the triage vital signs and the nursing notes.  Pertinent labs & imaging results that were available during my care of the patient were reviewed by me and considered in my medical decision making (see chart for details).     Symptoms most consistent with an allergic rhinitis exacerbation, but will rule out COVID with COVID PCR testing today.  Discussed restarting daily Zyrtec, Flonase regimen, sinus rinses, Coricidin and Mucinex products.  Isolation reviewed while waiting for COVID testing results.  Return for acutely worsening symptoms.  Final Clinical Impressions(s) / UC Diagnoses   Final diagnoses:  Sore throat  Eustachian tube dysfunction, bilateral  Seasonal allergic rhinitis due to other allergic trigger  Encounter for screening laboratory testing for COVID-19 virus   Discharge Instructions   None    ED Prescriptions    Medication Sig Dispense Auth. Provider   fluticasone (FLONASE) 50 MCG/ACT nasal spray Place 2 sprays into both nostrils in the morning and at bedtime. 16 g Particia Nearing, [place]     PDMP not reviewed this encounter.   Particia Nearing, [place] 10/10/20 1027

## 2020-10-10 NOTE — ED Triage Notes (Signed)
Pt here for bilateral ear pain and sore throat x 2 days

## 2020-10-11 LAB — SARS-COV-2, NAA 2 DAY TAT

## 2020-10-11 LAB — NOVEL CORONAVIRUS, NAA: SARS-CoV-2, NAA: DETECTED — AB

## 2020-10-12 ENCOUNTER — Telehealth: Payer: Self-pay | Admitting: Physician Assistant

## 2020-10-12 ENCOUNTER — Ambulatory Visit (INDEPENDENT_AMBULATORY_CARE_PROVIDER_SITE_OTHER): Payer: Medicare Other

## 2020-10-12 ENCOUNTER — Other Ambulatory Visit (HOSPITAL_COMMUNITY): Payer: Self-pay

## 2020-10-12 ENCOUNTER — Other Ambulatory Visit: Payer: Self-pay | Admitting: Physician Assistant

## 2020-10-12 DIAGNOSIS — E669 Obesity, unspecified: Secondary | ICD-10-CM

## 2020-10-12 DIAGNOSIS — U071 COVID-19: Secondary | ICD-10-CM

## 2020-10-12 DIAGNOSIS — J439 Emphysema, unspecified: Secondary | ICD-10-CM

## 2020-10-12 DIAGNOSIS — I10 Essential (primary) hypertension: Secondary | ICD-10-CM

## 2020-10-12 MED ORDER — FAMOTIDINE IN NACL 20-0.9 MG/50ML-% IV SOLN: 20.0000 mg | Freq: Once | INTRAVENOUS | Status: AC | PRN

## 2020-10-12 MED ORDER — AMOXICILLIN-POT CLAVULANATE 500-125 MG PO TABS
1.0000 | ORAL_TABLET | Freq: Two times a day (BID) | ORAL | 0 refills | Status: AC
  Filled 2020-10-12: qty 14, 7d supply, fill #0

## 2020-10-12 MED ORDER — METHYLPREDNISOLONE SODIUM SUCC 125 MG IJ SOLR
125.0000 mg | Freq: Once | INTRAMUSCULAR | Status: AC | PRN
Start: 2020-10-12 — End: 2020-10-12

## 2020-10-12 MED ORDER — EPINEPHRINE 0.3 MG/0.3ML IJ SOAJ: 0.3000 mg | Freq: Once | INTRAMUSCULAR | Status: AC | PRN

## 2020-10-12 MED ORDER — SODIUM CHLORIDE 0.9 % IV SOLN: INTRAVENOUS | Status: AC | PRN

## 2020-10-12 MED ORDER — DIPHENHYDRAMINE HCL 50 MG/ML IJ SOLN
50.0000 mg | Freq: Once | INTRAMUSCULAR | Status: AC | PRN
Start: 2020-10-12 — End: 2020-10-12

## 2020-10-12 MED ORDER — ALBUTEROL SULFATE HFA 108 (90 BASE) MCG/ACT IN AERS: 2.0000 | INHALATION_SPRAY | Freq: Once | RESPIRATORY_TRACT | Status: AC | PRN

## 2020-10-12 MED ORDER — BEBTELOVIMAB 175 MG/2 ML IV (EUA)
175.0000 mg | Freq: Once | INTRAMUSCULAR | Status: AC
  Administered 2020-10-12: 175 mg via INTRAVENOUS

## 2020-10-12 NOTE — Progress Notes (Signed)
Diagnosis: Covid  Provider:  Chilton Greathouse, MD  Procedure: Infusion  IV Type: Peripheral, IV Location: L Forearm  Bebtelovimab, Dose: 175mg   Infusion Start Time: 1431  Infusion Stop Time: 1432  Post Infusion IV Care: Observation period completed and Peripheral IV Discontinued  Discharge: Condition: Good, Destination: Home . AVS provided to patient.   Performed by:  , RN

## 2020-10-12 NOTE — Telephone Encounter (Signed)
I connected by phone with Nina Pitts on 10/12/2020 at 12:38 PM to discuss the potential use of a new treatment for mild to moderate COVID-19 viral infection in non-hospitalized patients.  This patient is a 60 y.o. female that meets the FDA criteria for Emergency Use Authorization of COVID monoclonal antibody bebtelovimab.  Has a (+) direct SARS-CoV-2 viral test result  Has mild or moderate COVID-19   Is NOT hospitalized due to COVID-19  Is within 10 days of symptom onset  Has at least one of the high risk factor(s) for progression to severe COVID-19 and/or hospitalization as defined in EUA.  Specific high risk criteria : Older age (>/= 59 yo), Diabetes, Cardiovascular disease or hypertension and Other high risk medical condition per CDC:  NIH tier 1 and unvaccinated   I have spoken and communicated the following to the patient or parent/caregiver regarding COVID monoclonal antibody treatment:  1. FDA has authorized the emergency use for the treatment of mild to moderate COVID-19 in adults and pediatric patients with positive results of direct SARS-CoV-2 viral testing who are 52 years of age and older weighing at least 40 kg, and who are at high risk for progressing to severe COVID-19 and/or hospitalization.  2. The significant known and potential risks and benefits of COVID monoclonal antibody, and the extent to which such potential risks and benefits are unknown.  3. Information on available alternative treatments and the risks and benefits of those alternatives, including clinical trials.  4. Patients treated with COVID monoclonal antibody should continue to self-isolate and use infection control measures (e.g., wear mask, isolate, social distance, avoid sharing personal items, clean and disinfect "high touch" surfaces, and frequent handwashing) according to CDC guidelines.   5. The patient or parent/caregiver has the option to accept or refuse COVID monoclonal antibody  treatment.  6. Discussion about the monoclonal antibody infusion does not ensure treatment. The patient will be placed on a list and scheduled according to risk, symptom onset and availability. A scheduler will reach to the patient to let them know if we can accommodate their infusion or not.  After reviewing this information with the patient, the patient has agreed to receive one of the available covid 19 monoclonal antibodies and will be provided an appropriate fact sheet prior to infusion. Cline Crock, PA-C 10/12/2020 12:38 PM

## 2020-11-02 ENCOUNTER — Other Ambulatory Visit (HOSPITAL_COMMUNITY): Payer: Self-pay

## 2020-11-02 ENCOUNTER — Other Ambulatory Visit (HOSPITAL_COMMUNITY): Payer: Self-pay | Admitting: Internal Medicine

## 2020-11-02 MED ORDER — LEVOTHYROXINE SODIUM 150 MCG PO TABS
150.0000 ug | ORAL_TABLET | ORAL | 1 refills | Status: DC
  Filled 2020-11-02: qty 6, 28d supply, fill #0
  Filled 2020-11-26: qty 6, 28d supply, fill #1
  Filled 2021-01-01: qty 6, 28d supply, fill #2
  Filled 2021-01-29: qty 6, 28d supply, fill #3
  Filled 2021-03-18: qty 6, 28d supply, fill #4
  Filled 2021-03-18: qty 8, 28d supply, fill #4
  Filled 2021-05-03: qty 6, 28d supply, fill #5
  Filled 2021-06-11: qty 6, 28d supply, fill #6
  Filled 2021-07-17: qty 6, 28d supply, fill #7
  Filled 2021-08-16: qty 6, 28d supply, fill #8
  Filled 2021-09-13: qty 6, 28d supply, fill #9
  Filled 2021-10-11: qty 6, 28d supply, fill #10

## 2020-11-02 MED ORDER — TIZANIDINE HCL 4 MG PO TABS
4.0000 mg | ORAL_TABLET | Freq: Three times a day (TID) | ORAL | 1 refills | Status: AC
  Filled 2020-11-02: qty 90, 30d supply, fill #0

## 2020-11-02 MED ORDER — LEVOTHYROXINE SODIUM 175 MCG PO TABS
175.0000 ug | ORAL_TABLET | Freq: Every day | ORAL | 3 refills | Status: DC
  Filled 2020-11-02: qty 20, 28d supply, fill #0
  Filled 2021-01-01: qty 60, 84d supply, fill #0
  Filled 2021-05-03: qty 20, 28d supply, fill #1
  Filled 2021-06-11: qty 20, 28d supply, fill #2
  Filled 2021-07-17: qty 20, 28d supply, fill #3
  Filled 2021-08-16: qty 20, 28d supply, fill #4
  Filled 2021-09-13: qty 20, 28d supply, fill #5
  Filled 2021-10-11: qty 20, 28d supply, fill #6

## 2020-11-02 MED ORDER — LOSARTAN POTASSIUM 50 MG PO TABS
50.0000 mg | ORAL_TABLET | Freq: Every day | ORAL | 3 refills | Status: DC
  Filled 2020-11-02: qty 90, 90d supply, fill #0
  Filled 2021-01-29: qty 90, 90d supply, fill #1

## 2020-11-02 MED FILL — Levothyroxine Sodium Tab 175 MCG: ORAL | 28 days supply | Qty: 20 | Fill #1 | Status: AC

## 2020-11-02 MED FILL — Atorvastatin Calcium Tab 20 MG (Base Equivalent): ORAL | 90 days supply | Qty: 45 | Fill #0 | Status: AC

## 2020-11-13 DIAGNOSIS — E7800 Pure hypercholesterolemia, unspecified: Secondary | ICD-10-CM | POA: Diagnosis not present

## 2020-11-13 DIAGNOSIS — I10 Essential (primary) hypertension: Secondary | ICD-10-CM | POA: Diagnosis not present

## 2020-11-13 DIAGNOSIS — K219 Gastro-esophageal reflux disease without esophagitis: Secondary | ICD-10-CM | POA: Diagnosis not present

## 2020-11-13 DIAGNOSIS — E039 Hypothyroidism, unspecified: Secondary | ICD-10-CM | POA: Diagnosis not present

## 2020-11-16 ENCOUNTER — Other Ambulatory Visit (HOSPITAL_COMMUNITY): Payer: Self-pay

## 2020-11-26 ENCOUNTER — Other Ambulatory Visit (HOSPITAL_COMMUNITY): Payer: Self-pay

## 2020-11-26 MED FILL — Levothyroxine Sodium Tab 175 MCG: ORAL | 28 days supply | Qty: 20 | Fill #2 | Status: AC

## 2020-11-27 NOTE — Progress Notes (Signed)
Diagnosis: Covid  Provider:  Chilton Greathouse, MD  Procedure: Infusion  IV Type: Peripheral, IV Location: L Forearm  Bebtelovimab, Dose: 175mg   Infusion Start Time: 1431  Infusion Stop Time: 1432  Post Infusion IV Care: Observation period completed and Peripheral IV Discontinued  Discharge: Condition: Good, Destination: Home . AVS provided to patient.   Performed by:  RN

## 2020-12-13 DIAGNOSIS — I10 Essential (primary) hypertension: Secondary | ICD-10-CM | POA: Diagnosis not present

## 2020-12-13 DIAGNOSIS — E7800 Pure hypercholesterolemia, unspecified: Secondary | ICD-10-CM | POA: Diagnosis not present

## 2020-12-13 DIAGNOSIS — E039 Hypothyroidism, unspecified: Secondary | ICD-10-CM | POA: Diagnosis not present

## 2020-12-13 DIAGNOSIS — K219 Gastro-esophageal reflux disease without esophagitis: Secondary | ICD-10-CM | POA: Diagnosis not present

## 2020-12-18 DIAGNOSIS — M545 Low back pain, unspecified: Secondary | ICD-10-CM | POA: Diagnosis not present

## 2020-12-18 DIAGNOSIS — M25562 Pain in left knee: Secondary | ICD-10-CM | POA: Diagnosis not present

## 2021-01-01 ENCOUNTER — Other Ambulatory Visit (HOSPITAL_COMMUNITY): Payer: Self-pay

## 2021-01-13 DIAGNOSIS — E7800 Pure hypercholesterolemia, unspecified: Secondary | ICD-10-CM | POA: Diagnosis not present

## 2021-01-13 DIAGNOSIS — I10 Essential (primary) hypertension: Secondary | ICD-10-CM | POA: Diagnosis not present

## 2021-01-13 DIAGNOSIS — K219 Gastro-esophageal reflux disease without esophagitis: Secondary | ICD-10-CM | POA: Diagnosis not present

## 2021-01-13 DIAGNOSIS — E039 Hypothyroidism, unspecified: Secondary | ICD-10-CM | POA: Diagnosis not present

## 2021-01-26 DIAGNOSIS — M79671 Pain in right foot: Secondary | ICD-10-CM | POA: Diagnosis not present

## 2021-01-26 DIAGNOSIS — S63501A Unspecified sprain of right wrist, initial encounter: Secondary | ICD-10-CM | POA: Diagnosis not present

## 2021-01-29 ENCOUNTER — Other Ambulatory Visit (HOSPITAL_COMMUNITY): Payer: Self-pay

## 2021-01-29 MED FILL — Atorvastatin Calcium Tab 20 MG (Base Equivalent): ORAL | 90 days supply | Qty: 45 | Fill #1 | Status: AC

## 2021-02-01 ENCOUNTER — Other Ambulatory Visit (HOSPITAL_COMMUNITY): Payer: Self-pay

## 2021-02-04 DIAGNOSIS — S92534A Nondisplaced fracture of distal phalanx of right lesser toe(s), initial encounter for closed fracture: Secondary | ICD-10-CM | POA: Diagnosis not present

## 2021-02-04 DIAGNOSIS — S92533A Displaced fracture of distal phalanx of unspecified lesser toe(s), initial encounter for closed fracture: Secondary | ICD-10-CM | POA: Insufficient documentation

## 2021-02-07 DIAGNOSIS — S63501A Unspecified sprain of right wrist, initial encounter: Secondary | ICD-10-CM | POA: Diagnosis not present

## 2021-03-01 ENCOUNTER — Other Ambulatory Visit (HOSPITAL_COMMUNITY): Payer: Self-pay

## 2021-03-01 DIAGNOSIS — E039 Hypothyroidism, unspecified: Secondary | ICD-10-CM | POA: Diagnosis not present

## 2021-03-01 DIAGNOSIS — E7800 Pure hypercholesterolemia, unspecified: Secondary | ICD-10-CM | POA: Diagnosis not present

## 2021-03-01 DIAGNOSIS — I10 Essential (primary) hypertension: Secondary | ICD-10-CM | POA: Diagnosis not present

## 2021-03-01 DIAGNOSIS — E559 Vitamin D deficiency, unspecified: Secondary | ICD-10-CM | POA: Diagnosis not present

## 2021-03-01 MED ORDER — LOSARTAN POTASSIUM 100 MG PO TABS
100.0000 mg | ORAL_TABLET | Freq: Every day | ORAL | 3 refills | Status: AC
  Filled 2021-03-01 – 2021-03-04 (×2): qty 90, 90d supply, fill #0

## 2021-03-04 ENCOUNTER — Other Ambulatory Visit (HOSPITAL_COMMUNITY): Payer: Self-pay

## 2021-03-14 ENCOUNTER — Other Ambulatory Visit (HOSPITAL_COMMUNITY): Payer: Self-pay

## 2021-03-14 DIAGNOSIS — I10 Essential (primary) hypertension: Secondary | ICD-10-CM | POA: Diagnosis not present

## 2021-03-14 MED ORDER — LOSARTAN POTASSIUM 100 MG PO TABS
100.0000 mg | ORAL_TABLET | Freq: Every day | ORAL | 3 refills | Status: DC
  Filled 2021-03-14 – 2021-05-31 (×2): qty 90, 90d supply, fill #0
  Filled 2021-09-04: qty 90, 90d supply, fill #1
  Filled 2021-12-02: qty 90, 90d supply, fill #2
  Filled 2022-03-12: qty 90, 90d supply, fill #3

## 2021-03-18 ENCOUNTER — Other Ambulatory Visit (HOSPITAL_COMMUNITY): Payer: Self-pay

## 2021-03-18 MED FILL — Levothyroxine Sodium Tab 175 MCG: ORAL | 28 days supply | Qty: 20 | Fill #3 | Status: AC

## 2021-03-29 DIAGNOSIS — Z23 Encounter for immunization: Secondary | ICD-10-CM | POA: Diagnosis not present

## 2021-05-01 DIAGNOSIS — N6323 Unspecified lump in the left breast, lower outer quadrant: Secondary | ICD-10-CM | POA: Diagnosis not present

## 2021-05-03 ENCOUNTER — Other Ambulatory Visit (HOSPITAL_COMMUNITY): Payer: Self-pay

## 2021-05-13 ENCOUNTER — Other Ambulatory Visit (HOSPITAL_COMMUNITY): Payer: Self-pay

## 2021-05-13 MED ORDER — ALBUTEROL SULFATE HFA 108 (90 BASE) MCG/ACT IN AERS
1.0000 | INHALATION_SPRAY | RESPIRATORY_TRACT | 2 refills | Status: AC
  Filled 2021-05-13: qty 8.5, 30d supply, fill #0

## 2021-05-13 MED ORDER — IBUPROFEN 600 MG PO TABS
600.0000 mg | ORAL_TABLET | Freq: Three times a day (TID) | ORAL | 1 refills | Status: AC
  Filled 2021-05-13: qty 30, 10d supply, fill #0

## 2021-05-14 ENCOUNTER — Other Ambulatory Visit (HOSPITAL_COMMUNITY): Payer: Self-pay

## 2021-05-21 ENCOUNTER — Other Ambulatory Visit (HOSPITAL_COMMUNITY): Payer: Self-pay

## 2021-05-21 ENCOUNTER — Other Ambulatory Visit: Payer: Self-pay | Admitting: Registered Nurse

## 2021-05-21 DIAGNOSIS — N6323 Unspecified lump in the left breast, lower outer quadrant: Secondary | ICD-10-CM

## 2021-05-21 MED ORDER — ATORVASTATIN CALCIUM 20 MG PO TABS
10.0000 mg | ORAL_TABLET | Freq: Every day | ORAL | 3 refills | Status: DC
  Filled 2021-05-21: qty 45, 90d supply, fill #0
  Filled 2021-09-02: qty 45, 90d supply, fill #1
  Filled 2021-12-02: qty 45, 90d supply, fill #2
  Filled 2022-04-30: qty 45, 90d supply, fill #3

## 2021-05-29 ENCOUNTER — Other Ambulatory Visit (HOSPITAL_COMMUNITY): Payer: Self-pay

## 2021-05-31 ENCOUNTER — Other Ambulatory Visit (HOSPITAL_COMMUNITY): Payer: Self-pay

## 2021-06-11 ENCOUNTER — Other Ambulatory Visit (HOSPITAL_COMMUNITY): Payer: Self-pay

## 2021-06-14 DIAGNOSIS — Z20822 Contact with and (suspected) exposure to covid-19: Secondary | ICD-10-CM | POA: Diagnosis not present

## 2021-06-15 DIAGNOSIS — Z20822 Contact with and (suspected) exposure to covid-19: Secondary | ICD-10-CM | POA: Diagnosis not present

## 2021-06-16 DIAGNOSIS — Z20822 Contact with and (suspected) exposure to covid-19: Secondary | ICD-10-CM | POA: Diagnosis not present

## 2021-07-04 ENCOUNTER — Ambulatory Visit
Admission: RE | Admit: 2021-07-04 | Discharge: 2021-07-04 | Disposition: A | Discharge status: Home / Self Care | Payer: Medicare Other | Source: Ambulatory Visit | Attending: Registered Nurse | Admitting: Registered Nurse

## 2021-07-04 DIAGNOSIS — N6489 Other specified disorders of breast: Secondary | ICD-10-CM | POA: Diagnosis not present

## 2021-07-04 DIAGNOSIS — N6323 Unspecified lump in the left breast, lower outer quadrant: Secondary | ICD-10-CM

## 2021-07-04 DIAGNOSIS — R922 Inconclusive mammogram: Secondary | ICD-10-CM | POA: Diagnosis not present

## 2021-07-08 ENCOUNTER — Other Ambulatory Visit (HOSPITAL_COMMUNITY): Payer: Self-pay

## 2021-07-10 ENCOUNTER — Other Ambulatory Visit: Payer: Self-pay | Admitting: Registered Nurse

## 2021-07-10 DIAGNOSIS — I700 Atherosclerosis of aorta: Secondary | ICD-10-CM | POA: Diagnosis not present

## 2021-07-10 DIAGNOSIS — E7800 Pure hypercholesterolemia, unspecified: Secondary | ICD-10-CM | POA: Diagnosis not present

## 2021-07-10 DIAGNOSIS — E039 Hypothyroidism, unspecified: Secondary | ICD-10-CM | POA: Diagnosis not present

## 2021-07-10 DIAGNOSIS — K219 Gastro-esophageal reflux disease without esophagitis: Secondary | ICD-10-CM | POA: Diagnosis not present

## 2021-07-10 DIAGNOSIS — E559 Vitamin D deficiency, unspecified: Secondary | ICD-10-CM | POA: Diagnosis not present

## 2021-07-10 DIAGNOSIS — I10 Essential (primary) hypertension: Secondary | ICD-10-CM | POA: Diagnosis not present

## 2021-07-10 DIAGNOSIS — Z0000 Encounter for general adult medical examination without abnormal findings: Secondary | ICD-10-CM | POA: Diagnosis not present

## 2021-07-10 DIAGNOSIS — Z1212 Encounter for screening for malignant neoplasm of rectum: Secondary | ICD-10-CM | POA: Diagnosis not present

## 2021-07-12 ENCOUNTER — Ambulatory Visit
Admission: EM | Admit: 2021-07-12 | Discharge: 2021-07-12 | Disposition: A | Discharge status: Home / Self Care | Payer: Medicare Other | Attending: Internal Medicine | Admitting: Internal Medicine

## 2021-07-12 ENCOUNTER — Other Ambulatory Visit: Payer: Self-pay

## 2021-07-12 DIAGNOSIS — J069 Acute upper respiratory infection, unspecified: Secondary | ICD-10-CM | POA: Diagnosis not present

## 2021-07-12 NOTE — Discharge Instructions (Signed)
It appears that you have a viral upper respiratory infection that should resolve on its own in the next few days.  Recommend symptomatic treatment.  It is most likely COVID given your exposure.  COVID test is pending.

## 2021-07-12 NOTE — ED Triage Notes (Signed)
Onset yesterday of sinus pressure around her forehead, nose and eyes. Laying down increases pressure. No meds taken. Denies cough.  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

## 2021-07-12 NOTE — ED Provider Notes (Signed)
[name]    CSN: [id number] Arrival date & time: 07/12/21  1409      History   Chief Complaint Chief Complaint  Patient presents with   sinus pressure    HPI Nina Pitts is a 61 y.o. female.   Patient presents with sinus pressure and nasal congestion that started yesterday.  Denies any known fevers.  She has been exposed to COVID-19 from her mother who tested positive yesterday.  Denies cough, chest pain, shortness of breath, sore throat, ear pain, nausea, vomiting, diarrhea, abdominal pain.  Patient has not taken any medications to help alleviate symptoms.    Past Medical History:  Diagnosis Date   Anxiety    Arthritis    "joints" (02/03/2013)   Carpal tunnel syndrome    Fatty liver disease, nonalcoholic dx'd "~ 0000000"   Fibromyalgia    Gallstones    GERD (gastroesophageal reflux disease)    Gout attack ~ 2011   Hypertension    Hypothyroidism    IBS (irritable bowel syndrome)    Obesity    Osteoarthritis of both knees    Pneumonia    PTSD (post-traumatic stress disorder)     Patient Active Problem List   Diagnosis Date Noted   COVID-19 virus infection 03/13/2020   GERD without esophagitis 03/13/2020   Essential hypertension 03/13/2020   Mixed hyperlipidemia 03/13/2020   Acute respiratory failure with hypoxia ([place]) 03/13/2020   Emphysema of lung ([place]) 03/13/2020   Incarcerated ventral hernia 10/17/2015   Aortic atherosclerosis ([place]) 03/21/2015   Fever 02/08/2013   Abdominal pain 02/08/2013   IBS (irritable bowel syndrome) 11/17/2012   Hypothyroidism 11/17/2012   Obesity, unspecified 11/17/2012    Past Surgical History:  Procedure Laterality Date   ABDOMINAL EXPLORATION SURGERY  04/27/2001   w/LOA/notes 04/27/2001 (02/03/2013)   ABDOMINAL HYSTERECTOMY  ~ 2000   partial   APPENDECTOMY  ~ 1974   AXILLARY HIDRADENITIS EXCISION Bilateral 1990's   with stsg   BILATERAL SALPINGOOPHORECTOMY Bilateral 04/27/2001   Archie Endo 04/27/2001  (02/03/2013)   CARDIAC CATHETERIZATION  ~ 2005   [place]; 1980; 1986   CHOLECYSTECTOMY  02/03/2013   CHOLECYSTECTOMY N/A 02/03/2013   Procedure: LAPAROSCOPIC CHOLECYSTECTOMY WITH INTRAOPERATIVE CHOLANGIOGRAM;  Surgeon: Rolm Bookbinder, MD;  Location: [place];  Service: General;  Laterality: N/A;   ERCP N/A 02/09/2013   Procedure: ENDOSCOPIC RETROGRADE CHOLANGIOPANCREATOGRAPHY (ERCP) with Stent Placement;  Surgeon: Jeryl Columbia, MD;  Location: [place];  Service: Endoscopy;  Laterality: N/A;   ESOPHAGOGASTRODUODENOSCOPY N/A 03/28/2013   Procedure: ESOPHAGOGASTRODUODENOSCOPY (EGD);  Surgeon: Jeryl Columbia, MD;  Location: [facility];  Service: Endoscopy;  Laterality: N/A;   HERNIA REPAIR  ~ [place] HERNIA REPAIR  10/17/2015   INCISIONAL HERNIA REPAIR N/A 10/17/2015   Procedure: LAPAROSCOPIC INCISIONAL HERNIA WITH MESH ;  Surgeon: Rolm Bookbinder, MD;  Location: [place];  Service: General;  Laterality: N/A;   INSERTION OF MESH N/A 10/17/2015   Procedure: INSERTION OF MESH;  Surgeon: Rolm Bookbinder, MD;  Location: [place] [facility];  Service: General;  Laterality: N/A;   KNEE ARTHROSCOPY Right 1990'S   PILONIDAL CYST EXCISION  2000?    OB History   No obstetric history on file.      Home Medications    Prior to Admission medications   Medication Sig Start Date End Date Taking? Authorizing Provider  acetaminophen (TYLENOL) 500 MG tablet Take 1,000 mg by mouth every 6 (six) hours as needed for moderate  pain or headache.    [provider]  albuterol (VENTOLIN HFA) 108 (90 Base) MCG/ACT inhaler INHALE 2 PUFFS INTO THE LUNGS EVERY 4 (FOUR) HOURS AS NEEDED FOR WHEEZING OR SHORTNESS OF BREATH. 03/19/20 03/19/21  Barb Merino, MD  albuterol (VENTOLIN HFA) 108 (90 Base) MCG/ACT inhaler Inhale 1-2 puffs into the lungs every 4 (four) hours as needed for wheezing or shortness of breath. 09/17/20   Melynda Ripple, MD  albuterol (VENTOLIN HFA) 108 (90 Base) MCG/ACT inhaler  Inhale 1 puff into the lungs every 4 (four) hours. 05/13/21     aspirin 81 MG chewable tablet Chew 81 mg by mouth daily.    [provider]  atorvastatin (LIPITOR) 20 MG tablet Take 20 mg by mouth daily.  02/16/13   [provider]  atorvastatin (LIPITOR) 20 MG tablet TAKE 1/2 TABLET BY MOUTH ONCE A DAY 07/23/20 07/23/21  Deland Pretty, MD  atorvastatin (LIPITOR) 20 MG tablet TAKE 1/2 TABLET BY MOUTH ONCE A DAY 09/22/19 09/21/20  Deland Pretty, MD  atorvastatin (LIPITOR) 20 MG tablet Take 0.5 tablets (10 mg total) by mouth daily. 05/21/21     cholecalciferol (VITAMIN D3) 25 MCG (1000 UNIT) tablet Take 1,000 Units by mouth daily.    [provider]  clonazePAM (KLONOPIN) 0.5 MG tablet Take 0.25-0.5 mg by mouth 2 (two) times daily as needed for anxiety.     [provider]  fluticasone (FLONASE) 50 MCG/ACT nasal spray Place 2 sprays into both nostrils in the morning and at bedtime. 10/10/20   Volney American, PA-C  ibuprofen (ADVIL) 600 MG tablet Take 1 tablet (600 mg total) by mouth 3 (three) times daily with food or milk as needed 05/13/21     ibuprofen (ADVIL,MOTRIN) 800 MG tablet Take 1 tablet (800 mg total) by mouth 3 (three) times daily. Patient taking differently: Take 800 mg by mouth 3 (three) times daily as needed. 05/12/18   Vanessa Kick, MD  levothyroxine (SYNTHROID) 150 MCG tablet Take 1 tablet (150 mcg total) by mouth on Saturday and every other Sunday 11/02/20     levothyroxine (SYNTHROID) 175 MCG tablet TAKE 1 TABLET BY MOUTH ONCE A DAY MONDAY THROUGH FRIDAY. 05/22/20 05/22/21  Deland Pretty, MD  levothyroxine (SYNTHROID) 175 MCG tablet Take 1 tablet (175 mcg total) by mouth daily monday through friday 11/02/20     losartan (COZAAR) 100 MG tablet Take 1 tablet (100 mg total) by mouth daily. 03/01/21     losartan (COZAAR) 100 MG tablet Take 1 tablet (100 mg total) by mouth daily. 03/14/21     losartan (COZAAR) 50 MG tablet Take 50 mg by mouth daily.    [provider]  losartan (COZAAR) 50 MG tablet TAKE 1 TABLET BY MOUTH ONCE DAILY 01/12/20 01/11/21  Deland Pretty, MD  losartan (COZAAR) 50 MG tablet Take 1 tablet (50 mg total) by mouth daily. 11/02/20     Multiple Vitamin (MULTIVITAMIN) tablet Take 1 tablet by mouth daily.    [provider]  pantoprazole (PROTONIX) 40 MG tablet Take 40 mg by mouth daily.    [provider]  Spacer/Aero-Holding Chambers (AEROCHAMBER PLUS) inhaler Use with inhaler 09/17/20   Melynda Ripple, MD  tiZANidine (ZANAFLEX) 4 MG tablet Take 4 mg by mouth every 6 (six) hours as needed for muscle spasms.    [provider]  tiZANidine (ZANAFLEX) 4 MG tablet Take 1 tablet (4 mg total) by mouth every 8 (eight) hours as needed 11/02/20  Family History Family History  Problem Relation Age of Onset   Pancreatic cancer Father    Esophageal cancer Father    Cancer Father        esophagus, lungs   Diabetes Mother    Heart disease Mother    COPD Mother    Lung cancer Mother    Cancer Paternal Grandmother        breast   Asthma Daughter     Social History Social History   Tobacco Use   Smoking status: Former    Packs/day: 0.50    Years: 15.00    Pack years: 7.50    Types: Cigarettes    Quit date: 11/18/1998    Years since quitting: 22.6   Smokeless tobacco: Never  Substance Use Topics   Alcohol use: No   Drug use: No     Allergies   Percocet [oxycodone-acetaminophen], Aspirin, Chocolate, Robaxin [methocarbamol], Vicodin [hydrocodone-acetaminophen], and Tramadol   Review of Systems Review of Systems Per HPI  Physical Exam Triage Vital Signs ED Triage Vitals  Enc Vitals Group     BP 07/12/21 1446 140/72     Pulse Rate 07/12/21 1446 (!) 57     Resp 07/12/21 1446 18     Temp 07/12/21 1446 97.7 F (36.5 C)     Temp Source 07/12/21 1446 Oral     SpO2 07/12/21 1446 97 %     Weight --      Height --      Head Circumference --      Peak Flow --      Pain Score  07/12/21 1453 6     Pain Loc --      Pain Edu? --      Excl. in [place]? --    No data found.  Updated Vital Signs BP 140/72 (BP Location: Left Arm)    Pulse (!) 57    Temp 97.7 F (36.5 C) (Oral)    Resp 18    SpO2 97%   Visual Acuity Right Eye Distance:   Left Eye Distance:   Bilateral Distance:    Right Eye Near:   Left Eye Near:    Bilateral Near:     Physical Exam Constitutional:      General: She is not in acute distress.    Appearance: Normal appearance. She is not toxic-appearing or diaphoretic.  HENT:     Head: Normocephalic and atraumatic.     Right Ear: Tympanic membrane and ear canal normal.     Left Ear: Tympanic membrane and ear canal normal.     Nose: Congestion present.     Mouth/Throat:     Mouth: Mucous membranes are moist.     Pharynx: No posterior oropharyngeal erythema.  Eyes:     Extraocular Movements: Extraocular movements intact.     Conjunctiva/sclera: Conjunctivae normal.     Pupils: Pupils are equal, round, and reactive to light.  Cardiovascular:     Rate and Rhythm: Normal rate and regular rhythm.     Pulses: Normal pulses.     Heart sounds: Normal heart sounds.  Pulmonary:     Effort: Pulmonary effort is normal. No respiratory distress.     Breath sounds: Normal breath sounds. No stridor. No wheezing, rhonchi or rales.  Abdominal:     General: Abdomen is flat. Bowel sounds are normal.     Palpations: Abdomen is soft.  Musculoskeletal:        General: Normal range of motion.  Cervical back: Normal range of motion.  Skin:    General: Skin is warm and dry.  Neurological:     General: No focal deficit present.     Mental Status: She is alert and oriented to person, place, and time. Mental status is at baseline.  Psychiatric:        Mood and Affect: Mood normal.        Behavior: Behavior normal.     UC Treatments / Results  Labs (all labs ordered are listed, but only abnormal results are displayed) Labs Reviewed  NOVEL CORONAVIRUS,  NAA    EKG   Radiology No results found.  Procedures Procedures (including critical care time)  Medications Ordered in UC Medications - No data to display  Initial Impression / Assessment and Plan / UC Course  I have reviewed the triage vital signs and the nursing notes.  Pertinent labs & imaging results that were available during my care of the patient were reviewed by me and considered in my medical decision making (see chart for details).     Patient presents with symptoms likely from a viral upper respiratory infection. Differential includes bacterial pneumonia, sinusitis, allergic rhinitis, COVID-19. Do not suspect underlying cardiopulmonary process. Symptoms seem unlikely related to ACS, CHF or COPD exacerbations, pneumonia, pneumothorax. Patient is nontoxic appearing and not in need of emergent medical intervention.  Highly suspicious for COVID-19 given patient's close exposure.  COVID-19 PCR is pending.  Awaiting result.  Recommended symptom control with over the counter medications.   Return if symptoms fail to improve in 1-2 weeks or you develop shortness of breath, chest pain, severe headache. Patient states understanding and is agreeable.  Discharged with PCP followup.  Final Clinical Impressions(s) / UC Diagnoses   Final diagnoses:  Viral upper respiratory infection     Discharge Instructions      It appears that you have a viral upper respiratory infection that should resolve on its own in the next few days.  Recommend symptomatic treatment.  It is most likely COVID given your exposure.  COVID test is pending.    ED Prescriptions   None    PDMP not reviewed this encounter.   Teodora Medici, [place] 07/12/21 [phone]

## 2021-07-13 LAB — NOVEL CORONAVIRUS, NAA: SARS-CoV-2, NAA: NOT DETECTED

## 2021-07-13 LAB — SARS-COV-2, NAA 2 DAY TAT

## 2021-07-17 ENCOUNTER — Other Ambulatory Visit (HOSPITAL_COMMUNITY): Payer: Self-pay

## 2021-07-18 DIAGNOSIS — Z20822 Contact with and (suspected) exposure to covid-19: Secondary | ICD-10-CM | POA: Diagnosis not present

## 2021-07-19 DIAGNOSIS — Z20822 Contact with and (suspected) exposure to covid-19: Secondary | ICD-10-CM | POA: Diagnosis not present

## 2021-07-20 DIAGNOSIS — Z20822 Contact with and (suspected) exposure to covid-19: Secondary | ICD-10-CM | POA: Diagnosis not present

## 2021-08-01 ENCOUNTER — Ambulatory Visit
Admission: RE | Admit: 2021-08-01 | Discharge: 2021-08-01 | Disposition: A | Discharge status: Home / Self Care | Payer: No Typology Code available for payment source | Source: Ambulatory Visit | Attending: Registered Nurse | Admitting: Registered Nurse

## 2021-08-01 DIAGNOSIS — I700 Atherosclerosis of aorta: Secondary | ICD-10-CM

## 2021-08-07 ENCOUNTER — Other Ambulatory Visit (HOSPITAL_COMMUNITY): Payer: Self-pay

## 2021-08-07 DIAGNOSIS — J069 Acute upper respiratory infection, unspecified: Secondary | ICD-10-CM | POA: Diagnosis not present

## 2021-08-07 MED ORDER — AMOXICILLIN 500 MG PO CAPS
500.0000 mg | ORAL_CAPSULE | Freq: Two times a day (BID) | ORAL | 0 refills | Status: DC
  Filled 2021-08-07: qty 14, 7d supply, fill #0

## 2021-08-16 ENCOUNTER — Other Ambulatory Visit (HOSPITAL_COMMUNITY): Payer: Self-pay

## 2021-08-21 DIAGNOSIS — M1712 Unilateral primary osteoarthritis, left knee: Secondary | ICD-10-CM | POA: Diagnosis not present

## 2021-08-21 DIAGNOSIS — M25562 Pain in left knee: Secondary | ICD-10-CM | POA: Diagnosis not present

## 2021-08-21 DIAGNOSIS — M25561 Pain in right knee: Secondary | ICD-10-CM | POA: Diagnosis not present

## 2021-09-02 ENCOUNTER — Other Ambulatory Visit (HOSPITAL_COMMUNITY): Payer: Self-pay

## 2021-09-04 ENCOUNTER — Other Ambulatory Visit (HOSPITAL_COMMUNITY): Payer: Self-pay

## 2021-09-13 ENCOUNTER — Other Ambulatory Visit (HOSPITAL_COMMUNITY): Payer: Self-pay

## 2021-09-19 DIAGNOSIS — R252 Cramp and spasm: Secondary | ICD-10-CM | POA: Diagnosis not present

## 2021-09-19 DIAGNOSIS — E039 Hypothyroidism, unspecified: Secondary | ICD-10-CM | POA: Diagnosis not present

## 2021-10-01 ENCOUNTER — Other Ambulatory Visit (HOSPITAL_COMMUNITY): Payer: Self-pay

## 2021-10-11 ENCOUNTER — Other Ambulatory Visit (HOSPITAL_COMMUNITY): Payer: Self-pay

## 2021-11-12 ENCOUNTER — Other Ambulatory Visit (HOSPITAL_COMMUNITY): Payer: Self-pay

## 2021-11-12 MED ORDER — LEVOTHYROXINE SODIUM 175 MCG PO TABS
175.0000 ug | ORAL_TABLET | ORAL | 3 refills | Status: DC
  Filled 2021-11-12: qty 20, 28d supply, fill #0
  Filled 2021-12-13: qty 20, 28d supply, fill #1
  Filled 2021-12-25 – 2022-01-03 (×3): qty 20, 28d supply, fill #2
  Filled 2022-02-19: qty 20, 28d supply, fill #3
  Filled 2022-03-24: qty 20, 28d supply, fill #4
  Filled 2022-04-21: qty 20, 28d supply, fill #5
  Filled 2022-05-16: qty 20, 28d supply, fill #6
  Filled 2022-06-20: qty 20, 28d supply, fill #7
  Filled 2022-07-18: qty 20, 28d supply, fill #8
  Filled 2022-08-15: qty 20, 28d supply, fill #9
  Filled 2022-09-08: qty 20, 28d supply, fill #10
  Filled 2022-10-09: qty 20, 28d supply, fill #11
  Filled 2022-10-28 – 2022-10-30 (×2): qty 20, 28d supply, fill #12

## 2021-11-12 MED ORDER — LEVOTHYROXINE SODIUM 150 MCG PO TABS
150.0000 ug | ORAL_TABLET | ORAL | 1 refills | Status: DC
  Filled 2021-11-12: qty 6, 28d supply, fill #0
  Filled 2021-12-13: qty 6, 28d supply, fill #1
  Filled 2021-12-25: qty 3, 14d supply, fill #2
  Filled 2022-01-01: qty 3, 14d supply, fill #3
  Filled 2022-01-03: qty 6, 28d supply, fill #3
  Filled 2022-02-19: qty 6, 28d supply, fill #4
  Filled 2022-03-24: qty 6, 28d supply, fill #5
  Filled 2022-04-21: qty 6, 28d supply, fill #6
  Filled 2022-05-16: qty 6, 28d supply, fill #7
  Filled 2022-06-20: qty 6, 28d supply, fill #8
  Filled 2022-07-18: qty 6, 28d supply, fill #9
  Filled 2022-08-15: qty 6, 28d supply, fill #10
  Filled 2022-09-08: qty 6, 28d supply, fill #11
  Filled 2022-10-09: qty 6, 28d supply, fill #12
  Filled 2022-10-28 – 2022-10-30 (×2): qty 6, 28d supply, fill #13

## 2021-11-13 ENCOUNTER — Other Ambulatory Visit (HOSPITAL_COMMUNITY): Payer: Self-pay

## 2021-12-02 ENCOUNTER — Other Ambulatory Visit (HOSPITAL_COMMUNITY): Payer: Self-pay

## 2021-12-13 ENCOUNTER — Other Ambulatory Visit (HOSPITAL_COMMUNITY): Payer: Self-pay

## 2021-12-25 ENCOUNTER — Other Ambulatory Visit (HOSPITAL_COMMUNITY): Payer: Self-pay

## 2022-01-01 ENCOUNTER — Other Ambulatory Visit (HOSPITAL_COMMUNITY): Payer: Self-pay

## 2022-01-01 MED ORDER — TIZANIDINE HCL 4 MG PO TABS
4.0000 mg | ORAL_TABLET | Freq: Three times a day (TID) | ORAL | 1 refills | Status: DC | PRN
  Filled 2022-01-01: qty 90, 30d supply, fill #0
  Filled 2022-12-30 – 2022-12-31 (×2): qty 90, 30d supply, fill #1

## 2022-01-01 MED ORDER — CLONAZEPAM 0.5 MG PO TABS
0.2500 mg | ORAL_TABLET | Freq: Two times a day (BID) | ORAL | 3 refills | Status: DC | PRN
Start: 2022-01-01 — End: 2023-07-02
  Filled 2022-01-01: qty 60, 30d supply, fill #0

## 2022-01-02 ENCOUNTER — Other Ambulatory Visit (HOSPITAL_COMMUNITY): Payer: Self-pay

## 2022-01-03 ENCOUNTER — Other Ambulatory Visit (HOSPITAL_COMMUNITY): Payer: Self-pay

## 2022-01-06 ENCOUNTER — Other Ambulatory Visit (HOSPITAL_COMMUNITY): Payer: Self-pay

## 2022-01-16 ENCOUNTER — Other Ambulatory Visit (HOSPITAL_COMMUNITY): Payer: Self-pay

## 2022-01-16 DIAGNOSIS — H1011 Acute atopic conjunctivitis, right eye: Secondary | ICD-10-CM | POA: Diagnosis not present

## 2022-01-16 MED ORDER — AZELASTINE HCL 0.05 % OP SOLN
1.0000 [drp] | Freq: Two times a day (BID) | OPHTHALMIC | 0 refills | Status: AC
  Filled 2022-01-16: qty 6, 7d supply, fill #0

## 2022-02-19 ENCOUNTER — Other Ambulatory Visit (HOSPITAL_COMMUNITY): Payer: Self-pay

## 2022-03-06 DIAGNOSIS — E039 Hypothyroidism, unspecified: Secondary | ICD-10-CM | POA: Diagnosis not present

## 2022-03-06 DIAGNOSIS — J302 Other seasonal allergic rhinitis: Secondary | ICD-10-CM | POA: Diagnosis not present

## 2022-03-06 DIAGNOSIS — I10 Essential (primary) hypertension: Secondary | ICD-10-CM | POA: Diagnosis not present

## 2022-03-12 ENCOUNTER — Other Ambulatory Visit (HOSPITAL_COMMUNITY): Payer: Self-pay

## 2022-03-24 ENCOUNTER — Other Ambulatory Visit (HOSPITAL_COMMUNITY): Payer: Self-pay

## 2022-03-25 ENCOUNTER — Other Ambulatory Visit (HOSPITAL_COMMUNITY): Payer: Self-pay

## 2022-04-21 ENCOUNTER — Other Ambulatory Visit (HOSPITAL_COMMUNITY): Payer: Self-pay

## 2022-04-24 ENCOUNTER — Other Ambulatory Visit (HOSPITAL_COMMUNITY): Payer: Self-pay

## 2022-04-30 ENCOUNTER — Other Ambulatory Visit (HOSPITAL_COMMUNITY): Payer: Self-pay

## 2022-05-16 ENCOUNTER — Other Ambulatory Visit (HOSPITAL_COMMUNITY): Payer: Self-pay

## 2022-05-16 MED ORDER — AMOXICILLIN-POT CLAVULANATE 500-125 MG PO TABS
1.0000 | ORAL_TABLET | Freq: Two times a day (BID) | ORAL | 0 refills | Status: DC
  Filled 2022-05-16: qty 20, 10d supply, fill #0

## 2022-06-20 ENCOUNTER — Other Ambulatory Visit (HOSPITAL_COMMUNITY): Payer: Self-pay

## 2022-06-23 ENCOUNTER — Other Ambulatory Visit (HOSPITAL_COMMUNITY): Payer: Self-pay

## 2022-06-23 MED ORDER — LOSARTAN POTASSIUM 100 MG PO TABS
100.0000 mg | ORAL_TABLET | Freq: Every day | ORAL | 3 refills | Status: DC
  Filled 2022-06-23: qty 90, 90d supply, fill #0
  Filled 2022-09-24: qty 90, 90d supply, fill #1
  Filled 2022-12-26: qty 90, 90d supply, fill #2
  Filled 2023-03-26: qty 90, 90d supply, fill #3

## 2022-06-24 ENCOUNTER — Other Ambulatory Visit (HOSPITAL_COMMUNITY): Payer: Self-pay

## 2022-07-11 ENCOUNTER — Other Ambulatory Visit: Payer: Self-pay | Admitting: Gastroenterology

## 2022-07-11 DIAGNOSIS — R194 Change in bowel habit: Secondary | ICD-10-CM | POA: Diagnosis not present

## 2022-07-11 DIAGNOSIS — R1032 Left lower quadrant pain: Secondary | ICD-10-CM | POA: Diagnosis not present

## 2022-07-11 DIAGNOSIS — R109 Unspecified abdominal pain: Secondary | ICD-10-CM

## 2022-07-18 ENCOUNTER — Other Ambulatory Visit (HOSPITAL_COMMUNITY): Payer: Self-pay

## 2022-07-24 ENCOUNTER — Ambulatory Visit
Admission: RE | Admit: 2022-07-24 | Discharge: 2022-07-24 | Disposition: A | Discharge status: Home / Self Care | Payer: Medicare Other | Source: Ambulatory Visit | Attending: Gastroenterology | Admitting: Gastroenterology

## 2022-07-24 ENCOUNTER — Other Ambulatory Visit: Payer: Self-pay | Admitting: Gastroenterology

## 2022-07-24 DIAGNOSIS — R109 Unspecified abdominal pain: Secondary | ICD-10-CM

## 2022-07-24 DIAGNOSIS — R1032 Left lower quadrant pain: Secondary | ICD-10-CM | POA: Diagnosis not present

## 2022-07-24 MED ORDER — IOPAMIDOL (ISOVUE-300) INJECTION 61%: 100.0000 mL | Freq: Once | INTRAVENOUS | Status: DC | PRN

## 2022-08-15 ENCOUNTER — Other Ambulatory Visit (HOSPITAL_COMMUNITY): Payer: Self-pay

## 2022-08-22 ENCOUNTER — Inpatient Hospital Stay: Admission: RE | Admit: 2022-08-22 | Payer: Medicare Other | Source: Ambulatory Visit

## 2022-08-22 ENCOUNTER — Other Ambulatory Visit (HOSPITAL_COMMUNITY): Payer: Self-pay

## 2022-08-22 ENCOUNTER — Ambulatory Visit
Admission: RE | Admit: 2022-08-22 | Discharge: 2022-08-22 | Disposition: A | Discharge status: Home / Self Care | Payer: Medicare Other | Source: Ambulatory Visit | Attending: Internal Medicine | Admitting: Internal Medicine

## 2022-08-22 VITALS — BP 162/78 | HR 64 | Temp 97.9°F | Resp 16

## 2022-08-22 DIAGNOSIS — H02842 Edema of right lower eyelid: Secondary | ICD-10-CM

## 2022-08-22 MED ORDER — ERYTHROMYCIN 5 MG/GM OP OINT
TOPICAL_OINTMENT | OPHTHALMIC | 0 refills | Status: AC
  Filled 2022-08-22: qty 3.5, 7d supply, fill #0

## 2022-08-22 MED ORDER — CEFDINIR 300 MG PO CAPS
300.0000 mg | ORAL_CAPSULE | Freq: Two times a day (BID) | ORAL | 0 refills | Status: AC
  Filled 2022-08-22: qty 14, 7d supply, fill #0

## 2022-08-22 NOTE — ED Provider Notes (Signed)
[name]    CSN: [id number] Arrival date & time: 08/22/22  0845      History   Chief Complaint Chief Complaint  Patient presents with   Eye Problem    My right eye has a knot on the bottom it looks like a boil also has some drainage. It itches and burns sometimes it's worst when I go outside. My eye is closed shut in the morning when I wake up I use a warm washcloth to clean it so I can open it. - Entered by patient    HPI Sammy Darras Kowitz is a 62 y.o. female.   Patient presents with right lower eyelid swelling that has been present for about 1 week.  Patient reports that it has been intermittently draining as well.  She denies trauma or foreign body to the eye.  Reports that it started off as a pimple-like area that is increased in size.  Denies any blurry vision.  Denies any associated fever.  Reports that she has used warm compresses with minimal improvement.  Blood pressure is also elevated.  She has taken her blood pressure medication as prescribed.  Patient not reporting chest pain, shortness of breath, headache, dizziness, blurred vision, nausea, vomiting.   Eye Problem   Past Medical History:  Diagnosis Date   Anxiety    Arthritis    "joints" (02/03/2013)   Carpal tunnel syndrome    Fatty liver disease, nonalcoholic dx'd "~ 0000000"   Fibromyalgia    Gallstones    GERD (gastroesophageal reflux disease)    Gout attack ~ 2011   Hypertension    Hypothyroidism    IBS (irritable bowel syndrome)    Obesity    Osteoarthritis of both knees    Pneumonia    PTSD (post-traumatic stress disorder)     Patient Active Problem List   Diagnosis Date Noted   COVID-19 virus infection 03/13/2020   GERD without esophagitis 03/13/2020   Essential hypertension 03/13/2020   Mixed hyperlipidemia 03/13/2020   Acute respiratory failure with hypoxia ([place]) 03/13/2020   Emphysema of lung ([place]) 03/13/2020   Incarcerated ventral hernia 10/17/2015   Aortic atherosclerosis  ([place]) 03/21/2015   Fever 02/08/2013   Abdominal pain 02/08/2013   IBS (irritable bowel syndrome) 11/17/2012   Hypothyroidism 11/17/2012   Obesity, unspecified 11/17/2012    Past Surgical History:  Procedure Laterality Date   ABDOMINAL EXPLORATION SURGERY  04/27/2001   w/LOA/notes 04/27/2001 (02/03/2013)   ABDOMINAL HYSTERECTOMY  ~ 2000   partial   APPENDECTOMY  ~ 1974   AXILLARY HIDRADENITIS EXCISION Bilateral 1990's   with stsg   BILATERAL SALPINGOOPHORECTOMY Bilateral 04/27/2001   Archie Endo 04/27/2001 (02/03/2013)   CARDIAC CATHETERIZATION  ~ 2005   [place]; 1980; 1986   CHOLECYSTECTOMY  02/03/2013   CHOLECYSTECTOMY N/A 02/03/2013   Procedure: LAPAROSCOPIC CHOLECYSTECTOMY WITH INTRAOPERATIVE CHOLANGIOGRAM;  Surgeon: Rolm Bookbinder, MD;  Location: [facility];  Service: General;  Laterality: N/A;   ERCP N/A 02/09/2013   Procedure: ENDOSCOPIC RETROGRADE CHOLANGIOPANCREATOGRAPHY (ERCP) with Stent Placement;  Surgeon: Jeryl Columbia, MD;  Location: [facility];  Service: Endoscopy;  Laterality: N/A;   ESOPHAGOGASTRODUODENOSCOPY N/A 03/28/2013   Procedure: ESOPHAGOGASTRODUODENOSCOPY (EGD);  Surgeon: Jeryl Columbia, MD;  Location[facility];  Service: Endoscopy;  Laterality: N/A;   HERNIA REPAIR  ~ [place] HERNIA REPAIR  10/17/2015   INCISIONAL HERNIA REPAIR N/A 10/17/2015   Procedure: LAPAROSCOPIC INCISIONAL HERNIA WITH MESH ;  Surgeon: Rodman Key  Donne Hazel, MD;  Location: [facility];  Service: General;  Laterality: N/A;   INSERTION OF MESH N/A 10/17/2015   Procedure: INSERTION OF MESH;  Surgeon: Rolm Bookbinder, MD;  Location: [facility];  Service: General;  Laterality: N/A;   KNEE ARTHROSCOPY Right 1990'S   PILONIDAL CYST EXCISION  2000?    OB History   No obstetric history on file.      Home Medications    Prior to Admission medications   Medication Sig Start Date End Date Taking? Authorizing Provider  cefdinir (OMNICEF) 300 MG capsule Take 1 capsule (300 mg total) by  mouth 2 (two) times daily for 7 days. 08/22/22 08/29/22 Yes Emelynn Rance, Michele Rockers, FNP  erythromycin ophthalmic ointment Place a 1/2 inch ribbon of ointment into the lower eyelid 4 times daily. 08/22/22  Yes Jakub Debold, Hildred Alamin E, FNP  acetaminophen (TYLENOL) 500 MG tablet Take 1,000 mg by mouth every 6 (six) hours as needed for moderate pain or headache.    [provider]  albuterol (VENTOLIN HFA) 108 (90 Base) MCG/ACT inhaler INHALE 2 PUFFS INTO THE LUNGS EVERY 4 (FOUR) HOURS AS NEEDED FOR WHEEZING OR SHORTNESS OF BREATH. 03/19/20 03/19/21  Barb Merino, MD  albuterol (VENTOLIN HFA) 108 (90 Base) MCG/ACT inhaler Inhale 1-2 puffs into the lungs every 4 (four) hours as needed for wheezing or shortness of breath. 09/17/20   Melynda Ripple, MD  albuterol (VENTOLIN HFA) 108 (90 Base) MCG/ACT inhaler Inhale 1 puff into the lungs every 4 (four) hours. 05/13/21     aspirin 81 MG chewable tablet Chew 81 mg by mouth daily.    [provider]  atorvastatin (LIPITOR) 20 MG tablet Take 20 mg by mouth daily.  02/16/13   [provider]  atorvastatin (LIPITOR) 20 MG tablet TAKE 1/2 TABLET BY MOUTH ONCE A DAY 07/23/20 07/23/21  Deland Pretty, MD  atorvastatin (LIPITOR) 20 MG tablet TAKE 1/2 TABLET BY MOUTH ONCE A DAY 09/22/19 09/21/20  Deland Pretty, MD  atorvastatin (LIPITOR) 20 MG tablet Take 0.5 tablets (10 mg total) by mouth daily. 05/21/21     azelastine (OPTIVAR) 0.05 % ophthalmic solution Instill 1 drop 2 (two) times daily in affected eye for 7 days 01/16/22     cholecalciferol (VITAMIN D3) 25 MCG (1000 UNIT) tablet Take 1,000 Units by mouth daily.    [provider]  clonazePAM (KLONOPIN) 0.5 MG tablet Take 0.25-0.5 mg by mouth 2 (two) times daily as needed for anxiety.     [provider]  clonazePAM (KLONOPIN) 0.5 MG tablet Take 0.5-1 tablets (0.25-0.5 mg total) by mouth 2 (two) times daily as needed. 01/01/22     fluticasone (FLONASE) 50 MCG/ACT nasal spray Place 2 sprays into both nostrils  in the morning and at bedtime. 10/10/20   Volney American, PA-C  ibuprofen (ADVIL) 600 MG tablet Take 1 tablet (600 mg total) by mouth 3 (three) times daily with food or milk as needed 05/13/21     ibuprofen (ADVIL,MOTRIN) 800 MG tablet Take 1 tablet (800 mg total) by mouth 3 (three) times daily. Patient taking differently: Take 800 mg by mouth 3 (three) times daily as needed. 05/12/18   Vanessa Kick, MD  levothyroxine (SYNTHROID) 150 MCG tablet Take 1 tablet (150 mcg total) by mouth on Saturday and every other Sunday 11/12/21     levothyroxine (SYNTHROID) 175 MCG tablet TAKE 1 TABLET BY MOUTH ONCE A DAY MONDAY THROUGH FRIDAY. 05/22/20 05/22/21  Deland Pretty, MD  levothyroxine (SYNTHROID) 175 MCG tablet Take 1  tablet (175 mcg total) by mouth daily Monday through Friday 11/12/21     losartan (COZAAR) 100 MG tablet Take 1 tablet (100 mg total) by mouth daily. 03/01/21     losartan (COZAAR) 100 MG tablet Take 1 tablet (100 mg total) by mouth daily. 06/22/22     losartan (COZAAR) 50 MG tablet Take 50 mg by mouth daily.    [provider]  losartan (COZAAR) 50 MG tablet TAKE 1 TABLET BY MOUTH ONCE DAILY 01/12/20 01/11/21  Deland Pretty, MD  Multiple Vitamin (MULTIVITAMIN) tablet Take 1 tablet by mouth daily.    [provider]  pantoprazole (PROTONIX) 40 MG tablet Take 40 mg by mouth daily.    [provider]  Spacer/Aero-Holding Chambers (AEROCHAMBER PLUS) inhaler Use with inhaler 09/17/20   Melynda Ripple, MD  tiZANidine (ZANAFLEX) 4 MG tablet Take 4 mg by mouth every 6 (six) hours as needed for muscle spasms.    [provider]  tiZANidine (ZANAFLEX) 4 MG tablet Take 1 tablet (4 mg total) by mouth every 8 (eight) hours as needed 11/02/20     tiZANidine (ZANAFLEX) 4 MG tablet Take 1 tablet (4 mg total) by mouth every 8 (eight) hours as needed. 01/01/22       Family History Family History  Problem Relation Age of Onset   Pancreatic cancer Father    Esophageal  cancer Father    Cancer Father        esophagus, lungs   Diabetes Mother    Heart disease Mother    COPD Mother    Lung cancer Mother    Cancer Paternal Grandmother        breast   Asthma Daughter     Social History Social History   Tobacco Use   Smoking status: Former    Packs/day: 0.50    Years: 15.00    Total pack years: 7.50    Types: Cigarettes    Quit date: 11/18/1998    Years since quitting: 23.7   Smokeless tobacco: Never  Substance Use Topics   Alcohol use: No   Drug use: No     Allergies   Percocet [oxycodone-acetaminophen], Aspirin, Chocolate, Robaxin [methocarbamol], Vicodin [hydrocodone-acetaminophen], and Tramadol   Review of Systems Review of Systems Per HPI  Physical Exam Triage Vital Signs ED Triage Vitals  Enc Vitals Group     BP 08/22/22 0904 (!) 177/91     Pulse Rate 08/22/22 0904 64     Resp 08/22/22 0904 16     Temp 08/22/22 0904 97.9 F (36.6 C)     Temp src --      SpO2 08/22/22 0904 96 %     Weight --      Height --      Head Circumference --      Peak Flow --      Pain Score 08/22/22 0905 6     Pain Loc --      Pain Edu? --      Excl. in [place]? --    No data found.  Updated Vital Signs BP (!) 162/78 (BP Location: Right Arm)   Pulse 64   Temp 97.9 F (36.6 C)   Resp 16   SpO2 96%   Visual Acuity Right Eye Distance:   Left Eye Distance:   Bilateral Distance:    Right Eye Near:   Left Eye Near:    Bilateral Near:     Physical Exam Constitutional:      General:  She is not in acute distress.    Appearance: Normal appearance. She is not toxic-appearing or diaphoretic.  HENT:     Head: Normocephalic and atraumatic.  Eyes:     Extraocular Movements: Extraocular movements intact.     Conjunctiva/sclera: Conjunctivae normal.      Comments: Patient has area of induration, erythema, swelling present to lower eyelid.  There is an area of the center that appears to have had dried drainage recently.  Eyeball appears normal.   Upper eyelid is normal.  No pain with extraocular movements.  Pulmonary:     Effort: Pulmonary effort is normal.  Neurological:     General: No focal deficit present.     Mental Status: She is alert and oriented to person, place, and time. Mental status is at baseline.     Cranial Nerves: Cranial nerves 2-12 are intact.     Sensory: Sensation is intact.     Motor: Motor function is intact.     Coordination: Coordination is intact.     Gait: Gait is intact.  Psychiatric:        Mood and Affect: Mood normal.        Behavior: Behavior normal.        Thought Content: Thought content normal.        Judgment: Judgment normal.      UC Treatments / Results  Labs (all labs ordered are listed, but only abnormal results are displayed) Labs Reviewed - No data to display  EKG   Radiology No results found.  Procedures Procedures (including critical care time)  Medications Ordered in UC Medications - No data to display  Initial Impression / Assessment and Plan / UC Course  I have reviewed the triage vital signs and the nursing notes.  Pertinent labs & imaging results that were available during my care of the patient were reviewed by me and considered in my medical decision making (see chart for details).     Physical exam is consistent with possible persistent and worsening stye/chalazion.  It has profuse swelling throughout the lower lid.  I do think that patient may need drainage of the lower lid so she was advised to follow-up with the provided contact information for [facility] to schedule an appointment as soon as possible for further evaluation and management.  Will start patient on cefdinir antibiotic orally as well as erythromycin topical antibiotic today until she can get in with ophthalmology.  Do not think that emergent evaluation is necessary at this time.  Visual acuity appears normal.   Patient's blood pressure is also slightly elevated.  Patient reports that  she has taken her blood pressure medication today.  She reports that it becomes elevated when she comes to the doctor's offices.  She is asymptomatic regarding blood pressure so do not think that emergent evaluation is necessary.  Encouraged patient to monitor blood pressure very closely at home and follow-up with PCP or urgent care if it remains elevated.  Advised strict return precautions.  Patient verbalized understanding and was agreeable with plan. Final Clinical Impressions(s) / UC Diagnoses   Final diagnoses:  Swelling of right lower eyelid     Discharge Instructions      It appears that you have infection of your lower eyelid.  I am treating this with topical medication that will go in your eye as well as an oral antibiotic.  Follow-up with eye doctor today to schedule appointment for further evaluation and management.  ED Prescriptions     Medication Sig Dispense Auth. Provider   cefdinir (OMNICEF) 300 MG capsule Take 1 capsule (300 mg total) by mouth 2 (two) times daily for 7 days. 14 capsule Rosa, [place] E, [place]   erythromycin ophthalmic ointment Place a 1/2 inch ribbon of ointment into the lower eyelid 4 times daily. 3.5 g Teodora Medici, [place]      PDMP not reviewed this encounter.   Teodora Medici, [place] 08/22/22 [phone]

## 2022-08-22 NOTE — Discharge Instructions (Signed)
It appears that you have infection of your lower eyelid.  I am treating this with topical medication that will go in your eye as well as an oral antibiotic.  Follow-up with eye doctor today to schedule appointment for further evaluation and management.

## 2022-08-22 NOTE — ED Triage Notes (Signed)
Pt states sore under her right eye for the past week that keeps getting bigger.  Swelling noted under right eye lid with open sore.

## 2022-09-04 ENCOUNTER — Other Ambulatory Visit (HOSPITAL_COMMUNITY): Payer: Self-pay

## 2022-09-04 MED ORDER — ATORVASTATIN CALCIUM 20 MG PO TABS
10.0000 mg | ORAL_TABLET | Freq: Every day | ORAL | 3 refills | Status: DC
  Filled 2022-09-04: qty 45, 90d supply, fill #0
  Filled 2022-12-30 – 2022-12-31 (×2): qty 45, 90d supply, fill #1
  Filled 2023-03-26: qty 45, 90d supply, fill #2
  Filled 2023-07-01: qty 45, 90d supply, fill #3

## 2022-09-08 ENCOUNTER — Other Ambulatory Visit (HOSPITAL_COMMUNITY): Payer: Self-pay

## 2022-09-09 ENCOUNTER — Other Ambulatory Visit (HOSPITAL_COMMUNITY): Payer: Self-pay

## 2022-09-24 ENCOUNTER — Other Ambulatory Visit (HOSPITAL_COMMUNITY): Payer: Self-pay

## 2022-10-09 ENCOUNTER — Other Ambulatory Visit: Payer: Self-pay

## 2022-10-09 ENCOUNTER — Other Ambulatory Visit (HOSPITAL_COMMUNITY): Payer: Self-pay

## 2022-10-28 ENCOUNTER — Other Ambulatory Visit: Payer: Self-pay

## 2022-11-24 ENCOUNTER — Other Ambulatory Visit (HOSPITAL_COMMUNITY): Payer: Self-pay

## 2022-12-05 ENCOUNTER — Other Ambulatory Visit: Payer: Self-pay

## 2022-12-05 ENCOUNTER — Other Ambulatory Visit (HOSPITAL_COMMUNITY): Payer: Self-pay

## 2022-12-05 MED ORDER — LEVOTHYROXINE SODIUM 150 MCG PO TABS
150.0000 ug | ORAL_TABLET | ORAL | 1 refills | Status: AC
  Filled 2022-12-05 (×2): qty 6, 28d supply, fill #0
  Filled 2022-12-26: qty 6, 28d supply, fill #1
  Filled 2023-01-30: qty 6, 28d supply, fill #2
  Filled 2023-02-13 – 2023-02-20 (×2): qty 6, 28d supply, fill #3
  Filled 2023-03-12 – 2023-03-13 (×2): qty 6, 28d supply, fill #4
  Filled 2023-03-26 – 2023-03-27 (×3): qty 6, 28d supply, fill #5
  Filled 2023-05-07: qty 6, 28d supply, fill #6
  Filled 2023-06-29 – 2023-08-03 (×2): qty 6, 28d supply, fill #7
  Filled 2023-09-10: qty 6, 28d supply, fill #8
  Filled 2023-10-07: qty 6, 28d supply, fill #9

## 2022-12-05 MED ORDER — LEVOTHYROXINE SODIUM 175 MCG PO TABS
175.0000 ug | ORAL_TABLET | Freq: Every day | ORAL | 1 refills | Status: DC
  Filled 2022-12-05 (×2): qty 20, 28d supply, fill #0
  Filled 2022-12-26: qty 20, 28d supply, fill #1
  Filled 2023-02-13 – 2023-02-17 (×3): qty 20, 28d supply, fill #2
  Filled 2023-03-12: qty 23, 23d supply, fill #3
  Filled 2023-03-13: qty 20, 28d supply, fill #3
  Filled 2023-03-26 – 2023-03-27 (×2): qty 20, 28d supply, fill #4
  Filled 2023-05-07: qty 20, 28d supply, fill #5
  Filled 2023-06-05: qty 20, 28d supply, fill #6
  Filled 2023-06-29: qty 20, 28d supply, fill #7
  Filled 2023-08-03: qty 20, 28d supply, fill #8

## 2022-12-19 DIAGNOSIS — M79641 Pain in right hand: Secondary | ICD-10-CM | POA: Diagnosis not present

## 2022-12-26 ENCOUNTER — Other Ambulatory Visit (HOSPITAL_COMMUNITY): Payer: Self-pay

## 2022-12-30 ENCOUNTER — Other Ambulatory Visit (HOSPITAL_COMMUNITY): Payer: Self-pay

## 2022-12-31 ENCOUNTER — Other Ambulatory Visit (HOSPITAL_COMMUNITY): Payer: Self-pay

## 2023-01-07 ENCOUNTER — Other Ambulatory Visit (HOSPITAL_COMMUNITY): Payer: Self-pay

## 2023-01-30 ENCOUNTER — Other Ambulatory Visit (HOSPITAL_COMMUNITY): Payer: Self-pay

## 2023-02-13 ENCOUNTER — Other Ambulatory Visit (HOSPITAL_COMMUNITY): Payer: Self-pay

## 2023-02-17 ENCOUNTER — Other Ambulatory Visit (HOSPITAL_COMMUNITY): Payer: Self-pay

## 2023-03-11 DIAGNOSIS — M170 Bilateral primary osteoarthritis of knee: Secondary | ICD-10-CM | POA: Diagnosis not present

## 2023-03-12 ENCOUNTER — Other Ambulatory Visit (HOSPITAL_COMMUNITY): Payer: Self-pay

## 2023-03-19 DIAGNOSIS — E559 Vitamin D deficiency, unspecified: Secondary | ICD-10-CM | POA: Diagnosis not present

## 2023-03-19 DIAGNOSIS — E039 Hypothyroidism, unspecified: Secondary | ICD-10-CM | POA: Diagnosis not present

## 2023-03-19 DIAGNOSIS — I10 Essential (primary) hypertension: Secondary | ICD-10-CM | POA: Diagnosis not present

## 2023-03-24 DIAGNOSIS — E7800 Pure hypercholesterolemia, unspecified: Secondary | ICD-10-CM | POA: Diagnosis not present

## 2023-03-24 DIAGNOSIS — E039 Hypothyroidism, unspecified: Secondary | ICD-10-CM | POA: Diagnosis not present

## 2023-03-24 DIAGNOSIS — I10 Essential (primary) hypertension: Secondary | ICD-10-CM | POA: Diagnosis not present

## 2023-03-24 DIAGNOSIS — E559 Vitamin D deficiency, unspecified: Secondary | ICD-10-CM | POA: Diagnosis not present

## 2023-03-24 DIAGNOSIS — I700 Atherosclerosis of aorta: Secondary | ICD-10-CM | POA: Diagnosis not present

## 2023-03-24 DIAGNOSIS — Z0000 Encounter for general adult medical examination without abnormal findings: Secondary | ICD-10-CM | POA: Diagnosis not present

## 2023-03-26 ENCOUNTER — Other Ambulatory Visit (HOSPITAL_COMMUNITY): Payer: Self-pay

## 2023-03-27 ENCOUNTER — Other Ambulatory Visit (HOSPITAL_COMMUNITY): Payer: Self-pay

## 2023-03-27 ENCOUNTER — Other Ambulatory Visit: Payer: Self-pay

## 2023-05-07 ENCOUNTER — Other Ambulatory Visit: Payer: Self-pay

## 2023-05-07 ENCOUNTER — Other Ambulatory Visit (HOSPITAL_COMMUNITY): Payer: Self-pay

## 2023-05-07 MED ORDER — LEVOTHYROXINE SODIUM 150 MCG PO TABS
ORAL_TABLET | ORAL | 1 refills | Status: AC
  Filled 2023-05-07 – 2023-06-03 (×2): qty 8, 30d supply, fill #0
  Filled 2023-06-29: qty 8, 30d supply, fill #1

## 2023-05-25 ENCOUNTER — Other Ambulatory Visit (HOSPITAL_COMMUNITY): Payer: Self-pay

## 2023-05-25 MED ORDER — AMOXICILLIN 500 MG PO CAPS
500.0000 mg | ORAL_CAPSULE | Freq: Three times a day (TID) | ORAL | 0 refills | Status: AC
  Filled 2023-05-25: qty 21, 7d supply, fill #0

## 2023-06-03 ENCOUNTER — Other Ambulatory Visit (HOSPITAL_COMMUNITY): Payer: Self-pay

## 2023-06-05 ENCOUNTER — Other Ambulatory Visit: Payer: Self-pay

## 2023-06-05 ENCOUNTER — Other Ambulatory Visit (HOSPITAL_COMMUNITY): Payer: Self-pay

## 2023-06-29 ENCOUNTER — Other Ambulatory Visit (HOSPITAL_COMMUNITY): Payer: Self-pay

## 2023-06-29 ENCOUNTER — Other Ambulatory Visit: Payer: Self-pay

## 2023-06-29 MED ORDER — LOSARTAN POTASSIUM 100 MG PO TABS
100.0000 mg | ORAL_TABLET | Freq: Every day | ORAL | 1 refills | Status: DC
  Filled 2023-06-29: qty 90, 90d supply, fill #0
  Filled 2023-09-30: qty 90, 90d supply, fill #1

## 2023-07-01 ENCOUNTER — Other Ambulatory Visit (HOSPITAL_COMMUNITY): Payer: Self-pay

## 2023-07-02 ENCOUNTER — Other Ambulatory Visit (HOSPITAL_COMMUNITY): Payer: Self-pay

## 2023-07-02 MED ORDER — CLONAZEPAM 0.5 MG PO TABS
0.2500 mg | ORAL_TABLET | Freq: Two times a day (BID) | ORAL | 0 refills | Status: AC | PRN
Start: 2023-07-02 — End: ?
  Filled 2023-07-02: qty 30, 15d supply, fill #0

## 2023-08-03 ENCOUNTER — Other Ambulatory Visit (HOSPITAL_COMMUNITY): Payer: Self-pay

## 2023-08-12 ENCOUNTER — Other Ambulatory Visit (HOSPITAL_COMMUNITY): Payer: Self-pay

## 2023-09-03 ENCOUNTER — Other Ambulatory Visit (HOSPITAL_COMMUNITY): Payer: Self-pay

## 2023-09-03 DIAGNOSIS — J302 Other seasonal allergic rhinitis: Secondary | ICD-10-CM | POA: Diagnosis not present

## 2023-09-03 DIAGNOSIS — J0100 Acute maxillary sinusitis, unspecified: Secondary | ICD-10-CM | POA: Diagnosis not present

## 2023-09-03 DIAGNOSIS — H9209 Otalgia, unspecified ear: Secondary | ICD-10-CM | POA: Diagnosis not present

## 2023-09-03 MED ORDER — ALBUTEROL SULFATE HFA 108 (90 BASE) MCG/ACT IN AERS
1.0000 | INHALATION_SPRAY | RESPIRATORY_TRACT | 1 refills | Status: AC
  Filled 2023-09-03: qty 6.7, 30d supply, fill #0

## 2023-09-03 MED ORDER — AMOXICILLIN-POT CLAVULANATE 500-125 MG PO TABS
1.0000 | ORAL_TABLET | Freq: Two times a day (BID) | ORAL | 0 refills | Status: AC
  Filled 2023-09-03: qty 20, 10d supply, fill #0

## 2023-09-04 ENCOUNTER — Other Ambulatory Visit (HOSPITAL_COMMUNITY): Payer: Self-pay

## 2023-09-04 MED ORDER — BENZONATATE 100 MG PO CAPS
100.0000 mg | ORAL_CAPSULE | Freq: Three times a day (TID) | ORAL | 0 refills | Status: DC | PRN
  Filled 2023-09-04: qty 30, 10d supply, fill #0

## 2023-09-07 ENCOUNTER — Other Ambulatory Visit (HOSPITAL_COMMUNITY): Payer: Self-pay

## 2023-09-10 ENCOUNTER — Other Ambulatory Visit (HOSPITAL_COMMUNITY): Payer: Self-pay

## 2023-09-10 MED ORDER — LEVOTHYROXINE SODIUM 175 MCG PO TABS
175.0000 ug | ORAL_TABLET | Freq: Every day | ORAL | 1 refills | Status: AC
  Filled 2023-09-10: qty 65, 90d supply, fill #0
  Filled 2023-09-10: qty 20, 28d supply, fill #0
  Filled 2023-10-07: qty 20, 28d supply, fill #1
  Filled 2024-01-29: qty 20, 28d supply, fill #2

## 2023-09-23 ENCOUNTER — Other Ambulatory Visit (HOSPITAL_COMMUNITY): Payer: Self-pay

## 2023-09-24 ENCOUNTER — Other Ambulatory Visit (HOSPITAL_COMMUNITY): Payer: Self-pay

## 2023-09-25 ENCOUNTER — Other Ambulatory Visit (HOSPITAL_COMMUNITY): Payer: Self-pay

## 2023-09-25 MED ORDER — TIZANIDINE HCL 4 MG PO TABS
4.0000 mg | ORAL_TABLET | Freq: Three times a day (TID) | ORAL | 1 refills | Status: AC | PRN
  Filled 2023-09-25: qty 90, 30d supply, fill #0

## 2023-09-29 DIAGNOSIS — I700 Atherosclerosis of aorta: Secondary | ICD-10-CM | POA: Diagnosis not present

## 2023-09-29 DIAGNOSIS — E559 Vitamin D deficiency, unspecified: Secondary | ICD-10-CM | POA: Diagnosis not present

## 2023-09-29 DIAGNOSIS — I10 Essential (primary) hypertension: Secondary | ICD-10-CM | POA: Diagnosis not present

## 2023-09-29 DIAGNOSIS — E039 Hypothyroidism, unspecified: Secondary | ICD-10-CM | POA: Diagnosis not present

## 2023-09-29 DIAGNOSIS — E7800 Pure hypercholesterolemia, unspecified: Secondary | ICD-10-CM | POA: Diagnosis not present

## 2023-09-29 DIAGNOSIS — R7301 Impaired fasting glucose: Secondary | ICD-10-CM | POA: Diagnosis not present

## 2023-09-30 ENCOUNTER — Other Ambulatory Visit (HOSPITAL_COMMUNITY): Payer: Self-pay

## 2023-10-07 ENCOUNTER — Other Ambulatory Visit (HOSPITAL_COMMUNITY): Payer: Self-pay

## 2023-10-07 ENCOUNTER — Other Ambulatory Visit: Payer: Self-pay

## 2023-10-08 DIAGNOSIS — R7301 Impaired fasting glucose: Secondary | ICD-10-CM | POA: Diagnosis not present

## 2023-10-08 DIAGNOSIS — E039 Hypothyroidism, unspecified: Secondary | ICD-10-CM | POA: Diagnosis not present

## 2023-10-08 DIAGNOSIS — I10 Essential (primary) hypertension: Secondary | ICD-10-CM | POA: Diagnosis not present

## 2023-10-08 DIAGNOSIS — E559 Vitamin D deficiency, unspecified: Secondary | ICD-10-CM | POA: Diagnosis not present

## 2023-10-09 ENCOUNTER — Other Ambulatory Visit (HOSPITAL_COMMUNITY): Payer: Self-pay

## 2023-10-09 ENCOUNTER — Ambulatory Visit
Admission: EM | Admit: 2023-10-09 | Discharge: 2023-10-09 | Disposition: A | Discharge status: Home / Self Care | Attending: Family Medicine | Admitting: Family Medicine

## 2023-10-09 ENCOUNTER — Encounter: Payer: Self-pay | Admitting: *Deleted

## 2023-10-09 DIAGNOSIS — J069 Acute upper respiratory infection, unspecified: Secondary | ICD-10-CM

## 2023-10-09 LAB — POC SARS CORONAVIRUS 2 AG -  ED: SARS Coronavirus 2 Ag: NEGATIVE

## 2023-10-09 MED ORDER — BENZONATATE 200 MG PO CAPS
200.0000 mg | ORAL_CAPSULE | Freq: Three times a day (TID) | ORAL | 0 refills | Status: AC | PRN
  Filled 2023-10-09: qty 21, 7d supply, fill #0

## 2023-10-09 NOTE — ED Provider Notes (Signed)
 [name]    CSN: [id number] Arrival date & time: 10/09/23  1248      History   Chief Complaint Chief Complaint  Patient presents with   Cough    HPI Nina Pitts is a 63 y.o. female.    Cough Associated symptoms: no shortness of breath and no wheezing    Patient is here for cough since last night.  Thought is was her sinuses, but the cough was more that normal.  Felt more phlegm in her throat.  Yellow in color.  No runny nose, congestion.  No fever/chills.  No otc medications used.  Her great granddaughter was sick with fever/runny nose.  She does have emphysema, was hospitalized with covid in the past.  No wheezing or sob currently.        Past Medical History:  Diagnosis Date   Anxiety    Arthritis    "joints" (02/03/2013)   Carpal tunnel syndrome    Fatty liver disease, nonalcoholic dx'd "~ 12/8467"   Fibromyalgia    Gallstones    GERD (gastroesophageal reflux disease)    Gout attack ~ 2011   Hypertension    Hypothyroidism    IBS (irritable bowel syndrome)    Obesity    Osteoarthritis of both knees    Pneumonia    PTSD (post-traumatic stress disorder)     Patient Active Problem List   Diagnosis Date Noted   COVID-19 virus infection 03/13/2020   GERD without esophagitis 03/13/2020   Essential hypertension 03/13/2020   Mixed hyperlipidemia 03/13/2020   Acute respiratory failure with hypoxia (HCC) 03/13/2020   Emphysema of lung (HCC) 03/13/2020   Incarcerated ventral hernia 10/17/2015   Aortic atherosclerosis (HCC) 03/21/2015   Fever 02/08/2013   Abdominal pain 02/08/2013   IBS (irritable bowel syndrome) 11/17/2012   Hypothyroidism 11/17/2012   Obesity, unspecified 11/17/2012    Past Surgical History:  Procedure Laterality Date   ABDOMINAL EXPLORATION SURGERY  04/27/2001   w/LOA/notes 04/27/2001 (02/03/2013)   ABDOMINAL HYSTERECTOMY  ~ 2000   partial   APPENDECTOMY  ~ 1974   AXILLARY HIDRADENITIS EXCISION Bilateral  1990's   with stsg   BILATERAL SALPINGOOPHORECTOMY Bilateral 04/27/2001   Maximo Spar 04/27/2001 (02/03/2013)   CARDIAC CATHETERIZATION  ~ 2005   CESAREAN SECTION  1979; 1980; 1986   CHOLECYSTECTOMY  02/03/2013   CHOLECYSTECTOMY N/A 02/03/2013   Procedure: LAPAROSCOPIC CHOLECYSTECTOMY WITH INTRAOPERATIVE CHOLANGIOGRAM;  Surgeon: Enid Harry, MD;  Location: [facility] OR;  Service: General;  Laterality: N/A;   ERCP N/A 02/09/2013   Procedure: ENDOSCOPIC RETROGRADE CHOLANGIOPANCREATOGRAPHY (ERCP) with Stent Placement;  Surgeon: Mathew Solomon, MD;  Location: [facility] OR;  Service: Endoscopy;  Laterality: N/A;   ESOPHAGOGASTRODUODENOSCOPY N/A 03/28/2013   Procedure: ESOPHAGOGASTRODUODENOSCOPY (EGD);  Surgeon: Mathew Solomon, MD;  Location: [facility];  Service: Endoscopy;  Laterality: N/A;   HERNIA REPAIR  ~ 1974   [facility]    INCISIONAL HERNIA REPAIR  10/17/2015   INCISIONAL HERNIA REPAIR N/A 10/17/2015   Procedure: LAPAROSCOPIC INCISIONAL HERNIA WITH MESH ;  Surgeon: Enid Harry, MD;  Location: [facility] OR;  Service: General;  Laterality: N/A;   INSERTION OF MESH N/A 10/17/2015   Procedure: INSERTION OF MESH;  Surgeon: Enid Harry, MD;  Location: [facility] OR;  Service: General;  Laterality: N/A;   KNEE ARTHROSCOPY Right 1990'S   PILONIDAL CYST EXCISION  2000?    OB History   No obstetric history on file.      Home Medications    Prior to Admission  medications   Medication Sig Start Date End Date Taking? Authorizing Provider  acetaminophen  (TYLENOL ) 500 MG tablet Take 1,000 mg by mouth every 6 (six) hours as needed for moderate pain or headache.   Yes [provider]  atorvastatin  (LIPITOR) 20 MG tablet Take 1/2 tablet (10 mg total) by mouth daily. 09/04/22  Yes   benzonatate  (TESSALON  PERLES) 100 MG capsule Take 1 capsule (100 mg total) by mouth 3 (three) times daily as needed for 10 days. 09/04/23  Yes   clonazePAM  (KLONOPIN ) 0.5 MG tablet Take 1/2-1 tablet (0.25-0.5 mg total) by mouth 2 (two) times  daily as needed. 07/02/23  Yes   levothyroxine  (SYNTHROID ) 150 MCG tablet Take 1 tablet (150 mcg total) by mouth on Saturday and every other Sunday. 12/05/22  Yes   levothyroxine  (SYNTHROID ) 175 MCG tablet Take 1 tablet (175 mcg total) by mouth daily Monday through Friday. 09/10/23  Yes   losartan  (COZAAR ) 100 MG tablet Take 1 tablet (100 mg total) by mouth daily. 06/29/23  Yes   Multiple Vitamin (MULTIVITAMIN) tablet Take 1 tablet by mouth daily.   Yes [provider]  tiZANidine  (ZANAFLEX ) 4 MG tablet Take 1 tablet (4 mg total) by mouth every 8 (eight) hours as needed. 09/25/23  Yes   albuterol  (VENTOLIN  HFA) 108 (90 Base) MCG/ACT inhaler INHALE 2 PUFFS INTO THE LUNGS EVERY 4 (FOUR) HOURS AS NEEDED FOR WHEEZING OR SHORTNESS OF BREATH. 03/19/20 03/19/21  Vada Garibaldi, MD  albuterol  (VENTOLIN  HFA) 108 (90 Base) MCG/ACT inhaler Inhale 1-2 puffs into the lungs every 4 (four) hours as needed for wheezing or shortness of breath. 09/17/20   Ethlyn Herd, MD  albuterol  (VENTOLIN  HFA) 108 (90 Base) MCG/ACT inhaler Inhale 1 puff into the lungs every 4 (four) hours. 05/13/21     albuterol  (VENTOLIN  HFA) 108 (90 Base) MCG/ACT inhaler Inhale 1 puff into the lungs every 4 (four) hours as needed 09/03/23     amoxicillin  (AMOXIL ) 500 MG capsule Take 1 capsule (500 mg total) by mouth every 8 (eight) hours until finished. 05/25/23     aspirin  81 MG chewable tablet Chew 81 mg by mouth daily.    [provider]  atorvastatin  (LIPITOR) 20 MG tablet Take 20 mg by mouth daily.  02/16/13   [provider]  atorvastatin  (LIPITOR) 20 MG tablet TAKE 1/2 TABLET BY MOUTH ONCE A DAY 07/23/20 07/23/21  Imelda Man, MD  atorvastatin  (LIPITOR) 20 MG tablet TAKE 1/2 TABLET BY MOUTH ONCE A DAY 09/22/19 09/21/20  Imelda Man, MD  azelastine  (OPTIVAR ) 0.05 % ophthalmic solution Instill 1 drop 2 (two) times daily in affected eye for 7 days 01/16/22     cholecalciferol  (VITAMIN D3) 25 MCG (1000 UNIT) tablet Take 1,000  Units by mouth daily.    [provider]  clonazePAM  (KLONOPIN ) 0.5 MG tablet Take 0.25-0.5 mg by mouth 2 (two) times daily as needed for anxiety.     [provider]  erythromycin  ophthalmic ointment Place a 1/2 inch ribbon of ointment into the lower eyelid 4 times daily. 08/22/22   Dodson Freestone, FNP  fluticasone  (FLONASE ) 50 MCG/ACT nasal spray Place 2 sprays into both nostrils in the morning and at bedtime. 10/10/20   Corbin Dess, PA-C  ibuprofen  (ADVIL ) 600 MG tablet Take 1 tablet (600 mg total) by mouth 3 (three) times daily with food or milk as needed 05/13/21     ibuprofen  (ADVIL ,MOTRIN ) 800 MG tablet Take 1 tablet (800 mg total) by mouth 3 (three)  times daily. Patient taking differently: Take 800 mg by mouth 3 (three) times daily as needed. 05/12/18   Afton Albright, MD  levothyroxine  (SYNTHROID ) 150 MCG tablet Take 1 tablet (150 mcg total) by mouth on Saturdays and every other Sunday 05/07/23     levothyroxine  (SYNTHROID ) 175 MCG tablet TAKE 1 TABLET BY MOUTH ONCE A DAY MONDAY THROUGH FRIDAY. 05/22/20 05/22/21  Imelda Man, MD  losartan  (COZAAR ) 100 MG tablet Take 1 tablet (100 mg total) by mouth daily. 03/01/21     losartan  (COZAAR ) 50 MG tablet Take 50 mg by mouth daily.    [provider]  losartan  (COZAAR ) 50 MG tablet TAKE 1 TABLET BY MOUTH ONCE DAILY 01/12/20 01/11/21  Imelda Man, MD  pantoprazole  (PROTONIX ) 40 MG tablet Take 40 mg by mouth daily.    [provider]  Spacer/Aero-Holding Chambers (AEROCHAMBER PLUS) inhaler Use with inhaler 09/17/20   Ethlyn Herd, MD  tiZANidine  (ZANAFLEX ) 4 MG tablet Take 4 mg by mouth every 6 (six) hours as needed for muscle spasms.    [provider]  tiZANidine  (ZANAFLEX ) 4 MG tablet Take 1 tablet (4 mg total) by mouth every 8 (eight) hours as needed 11/02/20       Family History Family History  Problem Relation Age of Onset   Pancreatic cancer Father    Esophageal cancer Father     Cancer Father        esophagus, lungs   Diabetes Mother    Heart disease Mother    COPD Mother    Lung cancer Mother    Cancer Paternal Grandmother        breast   Asthma Daughter     Social History Social History   Tobacco Use   Smoking status: Former    Current packs/day: 0.00    Average packs/day: 0.5 packs/day for 15.0 years (7.5 ttl pk-yrs)    Types: Cigarettes    Start date: 11/18/1983    Quit date: 11/18/1998    Years since quitting: 24.9   Smokeless tobacco: Never  Substance Use Topics   Alcohol use: No   Drug use: No     Allergies   Percocet [oxycodone -acetaminophen ], Aspirin , Chocolate, Robaxin [methocarbamol], Vicodin [hydrocodone-acetaminophen ], and Tramadol   Review of Systems Review of Systems  Constitutional: Negative.   HENT: Negative.    Respiratory:  Positive for cough. Negative for shortness of breath and wheezing.   Cardiovascular: Negative.   Gastrointestinal: Negative.   Genitourinary: Negative.   Musculoskeletal: Negative.   Psychiatric/Behavioral: Negative.       Physical Exam Triage Vital Signs ED Triage Vitals  Encounter Vitals Group     BP 10/09/23 1307 (!) 143/71     Systolic BP Percentile --      Diastolic BP Percentile --      Pulse Rate 10/09/23 1307 67     Resp 10/09/23 1307 18     Temp 10/09/23 1307 98.1 F (36.7 C)     Temp Source 10/09/23 1307 Oral     SpO2 10/09/23 1307 96 %     Weight --      Height --      Head Circumference --      Peak Flow --      Pain Score 10/09/23 1303 0     Pain Loc --      Pain Education --      Exclude from Growth Chart --    No data found.  Updated Vital Signs BP (!) 143/71 (  BP Location: Left Wrist)   Pulse 67   Temp 98.1 F (36.7 C) (Oral)   Resp 18   SpO2 96%   Visual Acuity Right Eye Distance:   Left Eye Distance:   Bilateral Distance:    Right Eye Near:   Left Eye Near:    Bilateral Near:     Physical Exam Constitutional:      General: She is not in acute  distress.    Appearance: Normal appearance. She is normal weight. She is not ill-appearing or toxic-appearing.  HENT:     Nose: Nose normal. No congestion or rhinorrhea.     Mouth/Throat:     Mouth: Mucous membranes are moist.     Pharynx: No oropharyngeal exudate or posterior oropharyngeal erythema.  Cardiovascular:     Rate and Rhythm: Normal rate and regular rhythm.  Pulmonary:     Effort: Pulmonary effort is normal.     Breath sounds: No wheezing, rhonchi or rales.  Musculoskeletal:        General: Normal range of motion.     Cervical back: Normal range of motion and neck supple. Tenderness present.  Lymphadenopathy:     Cervical: No cervical adenopathy.  Skin:    General: Skin is warm.  Neurological:     General: No focal deficit present.     Mental Status: She is alert.  Psychiatric:        Mood and Affect: Mood normal.      UC Treatments / Results  Labs (all labs ordered are listed, but only abnormal results are displayed) Labs Reviewed  POC SARS CORONAVIRUS 2 AG -  ED    EKG   Radiology No results found.  Procedures Procedures (including critical care time)  Medications Ordered in UC Medications - No data to display  Initial Impression / Assessment and Plan / UC Course  I have reviewed the triage vital signs and the nursing notes.  Pertinent labs & imaging results that were available during my care of the patient were reviewed by me and considered in my medical decision making (see chart for details).   Final Clinical Impressions(s) / UC Diagnoses   Final diagnoses:  Viral URI with cough     Discharge Instructions      You were seen for upper respiratory symptoms.  Your covid swab was negative.  At this time your symptoms appear viral.  I do not see any bacterial infection that warrants antibiotics.  I have sent out a medication to help with cough.  You may use over the counter claritin/zyrtec as needed for sinus congestion/drainage if that  develops.  If you have worsening symptoms such as cough, shortness of breath, or if you spike a fever, then please return for re-evaluation.     ED Prescriptions     Medication Sig Dispense Auth. Provider   benzonatate  (TESSALON ) 200 MG capsule Take 1 capsule (200 mg total) by mouth 3 (three) times daily as needed for cough. 21 capsule Lesle Ras, MD      PDMP not reviewed this encounter.   Lesle Ras, MD 10/09/23 1336

## 2023-10-09 NOTE — ED Triage Notes (Signed)
 Cough x 1 day with yellow mucous. Advised to come to Urgent by her PCP since they couldn't see her today. Denies fever, denies other symptoms. States "I think it's my allergies". She had antibiotics recently for similar symptoms

## 2023-10-09 NOTE — Discharge Instructions (Signed)
 You were seen for upper respiratory symptoms.  Your covid swab was negative.  At this time your symptoms appear viral.  I do not see any bacterial infection that warrants antibiotics.  I have sent out a medication to help with cough.  You may use over the counter claritin/zyrtec as needed for sinus congestion/drainage if that develops.  If you have worsening symptoms such as cough, shortness of breath, or if you spike a fever, then please return for re-evaluation.

## 2023-10-15 ENCOUNTER — Other Ambulatory Visit (HOSPITAL_COMMUNITY): Payer: Self-pay

## 2023-10-15 ENCOUNTER — Other Ambulatory Visit: Payer: Self-pay

## 2023-10-15 DIAGNOSIS — R7303 Prediabetes: Secondary | ICD-10-CM | POA: Diagnosis not present

## 2023-10-15 DIAGNOSIS — E559 Vitamin D deficiency, unspecified: Secondary | ICD-10-CM | POA: Diagnosis not present

## 2023-10-15 DIAGNOSIS — E039 Hypothyroidism, unspecified: Secondary | ICD-10-CM | POA: Diagnosis not present

## 2023-10-15 DIAGNOSIS — J45909 Unspecified asthma, uncomplicated: Secondary | ICD-10-CM | POA: Diagnosis not present

## 2023-10-15 MED ORDER — AZITHROMYCIN 250 MG PO TABS
ORAL_TABLET | ORAL | 0 refills | Status: AC
Start: 2023-10-15 — End: 2023-10-20
  Filled 2023-10-15: qty 6, 5d supply, fill #0

## 2023-10-15 MED ORDER — LEVOTHYROXINE SODIUM 175 MCG PO TABS
175.0000 ug | ORAL_TABLET | Freq: Every day | ORAL | 3 refills | Status: AC
  Filled 2023-10-15 (×2): qty 30, 30d supply, fill #0
  Filled 2023-12-11: qty 8, 28d supply, fill #0
  Filled 2024-02-01: qty 8, 28d supply, fill #1
  Filled 2024-02-24: qty 8, 28d supply, fill #2
  Filled 2024-03-28: qty 8, 28d supply, fill #3
  Filled 2024-05-26: qty 8, 28d supply, fill #4
  Filled 2024-07-05: qty 8, 28d supply, fill #5

## 2023-10-15 MED ORDER — VITAMIN D (ERGOCALCIFEROL) 1.25 MG (50000 UNIT) PO CAPS
50000.0000 [IU] | ORAL_CAPSULE | ORAL | 3 refills | Status: AC
  Filled 2023-10-15: qty 12, 84d supply, fill #0
  Filled 2023-12-22: qty 12, 84d supply, fill #1
  Filled 2024-03-17: qty 12, 84d supply, fill #2
  Filled 2024-06-13: qty 12, 84d supply, fill #3

## 2023-10-15 MED ORDER — LEVOTHYROXINE SODIUM 150 MCG PO TABS
150.0000 ug | ORAL_TABLET | ORAL | 3 refills | Status: AC
  Filled 2023-10-15: qty 100, 90d supply, fill #0
  Filled 2023-10-15: qty 65, 91d supply, fill #0
  Filled 2024-01-29: qty 20, 28d supply, fill #1
  Filled 2024-02-24: qty 20, 28d supply, fill #2
  Filled 2024-03-23: qty 20, 28d supply, fill #3
  Filled 2024-05-03: qty 20, 28d supply, fill #4
  Filled 2024-05-26: qty 20, 28d supply, fill #5
  Filled 2024-07-05: qty 20, 28d supply, fill #6

## 2023-10-16 ENCOUNTER — Other Ambulatory Visit (HOSPITAL_COMMUNITY): Payer: Self-pay

## 2023-10-21 ENCOUNTER — Other Ambulatory Visit (HOSPITAL_COMMUNITY): Payer: Self-pay

## 2023-11-26 DIAGNOSIS — E559 Vitamin D deficiency, unspecified: Secondary | ICD-10-CM | POA: Diagnosis not present

## 2023-11-26 DIAGNOSIS — E039 Hypothyroidism, unspecified: Secondary | ICD-10-CM | POA: Diagnosis not present

## 2023-12-11 ENCOUNTER — Other Ambulatory Visit (HOSPITAL_COMMUNITY): Payer: Self-pay

## 2023-12-17 ENCOUNTER — Other Ambulatory Visit (HOSPITAL_COMMUNITY): Payer: Self-pay

## 2023-12-21 ENCOUNTER — Other Ambulatory Visit (HOSPITAL_COMMUNITY): Payer: Self-pay

## 2023-12-21 MED ORDER — ATORVASTATIN CALCIUM 20 MG PO TABS
10.0000 mg | ORAL_TABLET | Freq: Every day | ORAL | 3 refills | Status: AC
  Filled 2023-12-21: qty 45, 90d supply, fill #0
  Filled 2024-05-18: qty 45, 90d supply, fill #1

## 2023-12-21 MED ORDER — LOSARTAN POTASSIUM 100 MG PO TABS
100.0000 mg | ORAL_TABLET | Freq: Every day | ORAL | 1 refills | Status: DC
  Filled 2023-12-21: qty 90, 90d supply, fill #0
  Filled 2024-02-10: qty 30, 30d supply, fill #1
  Filled 2024-02-24 – 2024-03-04 (×3): qty 30, 30d supply, fill #2
  Filled 2024-05-18: qty 30, 30d supply, fill #3

## 2023-12-22 ENCOUNTER — Other Ambulatory Visit (HOSPITAL_COMMUNITY): Payer: Self-pay

## 2024-01-17 DIAGNOSIS — T5811XA Toxic effect of carbon monoxide from utility gas, accidental (unintentional), initial encounter: Secondary | ICD-10-CM | POA: Diagnosis not present

## 2024-01-17 DIAGNOSIS — L299 Pruritus, unspecified: Secondary | ICD-10-CM | POA: Diagnosis not present

## 2024-01-17 DIAGNOSIS — X140XXA Inhalation of hot air and gases, initial encounter: Secondary | ICD-10-CM | POA: Diagnosis not present

## 2024-01-17 DIAGNOSIS — T5991XA Toxic effect of unspecified gases, fumes and vapors, accidental (unintentional), initial encounter: Secondary | ICD-10-CM | POA: Diagnosis not present

## 2024-01-19 ENCOUNTER — Other Ambulatory Visit (HOSPITAL_COMMUNITY): Payer: Self-pay

## 2024-01-19 MED ORDER — HYDROXYZINE HCL 25 MG PO TABS
25.0000 mg | ORAL_TABLET | Freq: Four times a day (QID) | ORAL | 0 refills | Status: AC | PRN
  Filled 2024-01-19: qty 12, 3d supply, fill #0

## 2024-01-29 ENCOUNTER — Other Ambulatory Visit (HOSPITAL_COMMUNITY): Payer: Self-pay

## 2024-02-01 ENCOUNTER — Other Ambulatory Visit (HOSPITAL_COMMUNITY): Payer: Self-pay

## 2024-02-03 DIAGNOSIS — T5991XD Toxic effect of unspecified gases, fumes and vapors, accidental (unintentional), subsequent encounter: Secondary | ICD-10-CM | POA: Diagnosis not present

## 2024-02-03 DIAGNOSIS — R7303 Prediabetes: Secondary | ICD-10-CM | POA: Diagnosis not present

## 2024-02-03 DIAGNOSIS — L239 Allergic contact dermatitis, unspecified cause: Secondary | ICD-10-CM | POA: Diagnosis not present

## 2024-02-03 DIAGNOSIS — E039 Hypothyroidism, unspecified: Secondary | ICD-10-CM | POA: Diagnosis not present

## 2024-02-09 ENCOUNTER — Other Ambulatory Visit (HOSPITAL_COMMUNITY): Payer: Self-pay

## 2024-02-10 ENCOUNTER — Other Ambulatory Visit (HOSPITAL_COMMUNITY): Payer: Self-pay

## 2024-02-10 ENCOUNTER — Other Ambulatory Visit: Payer: Self-pay

## 2024-02-24 ENCOUNTER — Other Ambulatory Visit (HOSPITAL_COMMUNITY): Payer: Self-pay

## 2024-02-29 ENCOUNTER — Other Ambulatory Visit (HOSPITAL_COMMUNITY): Payer: Self-pay

## 2024-03-17 ENCOUNTER — Other Ambulatory Visit (HOSPITAL_COMMUNITY): Payer: Self-pay

## 2024-03-23 ENCOUNTER — Other Ambulatory Visit (HOSPITAL_COMMUNITY): Payer: Self-pay

## 2024-03-24 DIAGNOSIS — E7800 Pure hypercholesterolemia, unspecified: Secondary | ICD-10-CM | POA: Diagnosis not present

## 2024-03-24 DIAGNOSIS — Z0000 Encounter for general adult medical examination without abnormal findings: Secondary | ICD-10-CM | POA: Diagnosis not present

## 2024-03-24 DIAGNOSIS — E039 Hypothyroidism, unspecified: Secondary | ICD-10-CM | POA: Diagnosis not present

## 2024-03-24 DIAGNOSIS — E559 Vitamin D deficiency, unspecified: Secondary | ICD-10-CM | POA: Diagnosis not present

## 2024-03-28 ENCOUNTER — Other Ambulatory Visit (HOSPITAL_COMMUNITY): Payer: Self-pay

## 2024-03-29 ENCOUNTER — Other Ambulatory Visit (HOSPITAL_COMMUNITY): Payer: Self-pay

## 2024-03-29 DIAGNOSIS — L739 Follicular disorder, unspecified: Secondary | ICD-10-CM | POA: Diagnosis not present

## 2024-03-29 DIAGNOSIS — I10 Essential (primary) hypertension: Secondary | ICD-10-CM | POA: Diagnosis not present

## 2024-03-29 DIAGNOSIS — I700 Atherosclerosis of aorta: Secondary | ICD-10-CM | POA: Diagnosis not present

## 2024-03-29 DIAGNOSIS — Z0000 Encounter for general adult medical examination without abnormal findings: Secondary | ICD-10-CM | POA: Diagnosis not present

## 2024-03-29 DIAGNOSIS — E039 Hypothyroidism, unspecified: Secondary | ICD-10-CM | POA: Diagnosis not present

## 2024-03-29 DIAGNOSIS — E559 Vitamin D deficiency, unspecified: Secondary | ICD-10-CM | POA: Diagnosis not present

## 2024-03-29 DIAGNOSIS — R7303 Prediabetes: Secondary | ICD-10-CM | POA: Diagnosis not present

## 2024-03-29 MED ORDER — CEPHALEXIN 500 MG PO CAPS
500.0000 mg | ORAL_CAPSULE | Freq: Four times a day (QID) | ORAL | 0 refills | Status: AC
  Filled 2024-03-29: qty 40, 10d supply, fill #0

## 2024-04-06 DIAGNOSIS — N611 Abscess of the breast and nipple: Secondary | ICD-10-CM | POA: Diagnosis not present

## 2024-04-28 ENCOUNTER — Other Ambulatory Visit (HOSPITAL_COMMUNITY): Payer: Self-pay

## 2024-04-28 MED ORDER — FLUCONAZOLE 150 MG PO TABS
150.0000 mg | ORAL_TABLET | Freq: Every day | ORAL | 0 refills | Status: AC
  Filled 2024-04-28: qty 5, 5d supply, fill #0

## 2024-04-28 MED ORDER — CLOTRIMAZOLE-BETAMETHASONE 1-0.05 % EX CREA
TOPICAL_CREAM | Freq: Two times a day (BID) | CUTANEOUS | 1 refills | Status: AC
Start: 2024-04-28 — End: ?
  Filled 2024-04-28: qty 45, 90d supply, fill #0

## 2024-05-03 ENCOUNTER — Other Ambulatory Visit (HOSPITAL_COMMUNITY): Payer: Self-pay

## 2024-05-18 ENCOUNTER — Other Ambulatory Visit (HOSPITAL_COMMUNITY): Payer: Self-pay

## 2024-05-26 ENCOUNTER — Other Ambulatory Visit (HOSPITAL_COMMUNITY): Payer: Self-pay

## 2024-06-02 ENCOUNTER — Encounter: Payer: Self-pay | Admitting: Registered Nurse

## 2024-06-02 DIAGNOSIS — Z1231 Encounter for screening mammogram for malignant neoplasm of breast: Secondary | ICD-10-CM

## 2024-06-13 ENCOUNTER — Other Ambulatory Visit (HOSPITAL_COMMUNITY): Payer: Self-pay

## 2024-06-15 ENCOUNTER — Ambulatory Visit: Admission: EM | Admit: 2024-06-15 | Discharge: 2024-06-15 | Disposition: A | Discharge status: Home / Self Care

## 2024-06-15 ENCOUNTER — Other Ambulatory Visit (HOSPITAL_COMMUNITY): Payer: Self-pay

## 2024-06-15 ENCOUNTER — Encounter: Payer: Self-pay | Admitting: Emergency Medicine

## 2024-06-15 DIAGNOSIS — Z1211 Encounter for screening for malignant neoplasm of colon: Secondary | ICD-10-CM | POA: Insufficient documentation

## 2024-06-15 DIAGNOSIS — G5601 Carpal tunnel syndrome, right upper limb: Secondary | ICD-10-CM

## 2024-06-15 DIAGNOSIS — R1311 Dysphagia, oral phase: Secondary | ICD-10-CM | POA: Insufficient documentation

## 2024-06-15 DIAGNOSIS — R634 Abnormal weight loss: Secondary | ICD-10-CM | POA: Insufficient documentation

## 2024-06-15 DIAGNOSIS — R1032 Left lower quadrant pain: Secondary | ICD-10-CM | POA: Insufficient documentation

## 2024-06-15 DIAGNOSIS — K2100 Gastro-esophageal reflux disease with esophagitis, without bleeding: Secondary | ICD-10-CM | POA: Insufficient documentation

## 2024-06-15 DIAGNOSIS — K562 Volvulus: Secondary | ICD-10-CM | POA: Insufficient documentation

## 2024-06-15 DIAGNOSIS — R932 Abnormal findings on diagnostic imaging of liver and biliary tract: Secondary | ICD-10-CM | POA: Insufficient documentation

## 2024-06-15 DIAGNOSIS — Z1210 Encounter for screening for malignant neoplasm of intestinal tract, unspecified: Secondary | ICD-10-CM | POA: Insufficient documentation

## 2024-06-15 DIAGNOSIS — R194 Change in bowel habit: Secondary | ICD-10-CM | POA: Insufficient documentation

## 2024-06-15 MED ORDER — DICLOFENAC SODIUM 75 MG PO TBEC
75.0000 mg | DELAYED_RELEASE_TABLET | Freq: Two times a day (BID) | ORAL | 0 refills | Status: AC | PRN
  Filled 2024-06-15: qty 30, 15d supply, fill #0

## 2024-06-15 NOTE — ED Provider Notes (Signed)
 " [name]    CSN: [id number] Arrival date & time: 06/15/24  1450      History   Chief Complaint Chief Complaint  Patient presents with   Wrist Pain    HPI SAAVI MCEACHRON is a 63 y.o. female.   Pt presents today due to pain and numbness in her right thumb since yesterday. Pt states that she has noticed that when resting on right wrist she experiencing pain and numbness across entire wrist. Pt denies known trauma to wrist and denies use of anything for pain.   The history is provided by the patient.  Wrist Pain    Past Medical History:  Diagnosis Date   Anxiety    Arthritis    joints (02/03/2013)   Carpal tunnel syndrome    Fatty liver disease, nonalcoholic dx'd ~ 09/2012   Fibromyalgia    Gallstones    GERD (gastroesophageal reflux disease)    Gout attack ~ 2011   Hypertension    Hypothyroidism    IBS (irritable bowel syndrome)    Obesity    Osteoarthritis of both knees    Pneumonia    PTSD (post-traumatic stress disorder)     Patient Active Problem List   Diagnosis Date Noted   Abnormal findings on imaging of biliary tract 06/15/2024   Altered bowel habits 06/15/2024   Gastro-esophageal reflux disease with esophagitis 06/15/2024   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   Left lower quadrant pain 06/15/2024   Oral phase dysphagia 06/15/2024   Colon cancer screening 06/15/2024   Screening for intestinal cancer 06/15/2024   Weight loss 06/15/2024   Pain in right hand 12/19/2022   Closed fracture of distal phalanx of lesser toe 02/04/2021   Pain of lumbar spine 05/22/2020   Spondylolisthesis of lumbar region 05/22/2020   COVID-19 virus infection 03/13/2020   GERD without esophagitis 03/13/2020   Essential hypertension 03/13/2020   Mixed hyperlipidemia 03/13/2020   Acute respiratory failure with hypoxia (HCC) 03/13/2020   Emphysema of lung (HCC) 03/13/2020   Arthralgia of both knees 03/23/2018   Osteoarthritis of knee 03/23/2018    Osteoarthritis of left knee 03/23/2018   Osteoarthritis of right knee 03/23/2018   Incarcerated ventral hernia 10/17/2015   Aortic atherosclerosis 03/21/2015   Fever 02/08/2013   Abdominal pain 02/08/2013   IBS (irritable bowel syndrome) 11/17/2012   Hypothyroidism 11/17/2012   Obesity, unspecified 11/17/2012    Past Surgical History:  Procedure Laterality Date   ABDOMINAL EXPLORATION SURGERY  04/27/2001   w/LOA/notes 04/27/2001 (02/03/2013)   ABDOMINAL HYSTERECTOMY  ~ 2000   partial   APPENDECTOMY  ~ 1974   AXILLARY HIDRADENITIS EXCISION Bilateral 1990's   with stsg   BILATERAL SALPINGOOPHORECTOMY Bilateral 04/27/2001   thelbert 04/27/2001 (02/03/2013)   CARDIAC CATHETERIZATION  ~ 2005   CESAREAN SECTION  1979; 1980; 1986   CHOLECYSTECTOMY  02/03/2013   CHOLECYSTECTOMY N/A 02/03/2013   Procedure: LAPAROSCOPIC CHOLECYSTECTOMY WITH INTRAOPERATIVE CHOLANGIOGRAM;  Surgeon: Donnice Bury, MD;  Location: [facility] OR;  Service: General;  Laterality: N/A;   ERCP N/A 02/09/2013   Procedure: ENDOSCOPIC RETROGRADE CHOLANGIOPANCREATOGRAPHY (ERCP) with Stent Placement;  Surgeon: Oliva FORBES Boots, MD;  Location: [facility] OR;  Service: Endoscopy;  Laterality: N/A;   ESOPHAGOGASTRODUODENOSCOPY N/A 03/28/2013   Procedure: ESOPHAGOGASTRODUODENOSCOPY (EGD);  Surgeon: Oliva FORBES Boots, MD;  Location: [facility];  Service: Endoscopy;  Laterality: N/A;   HERNIA REPAIR  ~ 1974   [facility]    INCISIONAL HERNIA REPAIR  10/17/2015   INCISIONAL HERNIA REPAIR N/A 10/17/2015  Procedure: LAPAROSCOPIC INCISIONAL HERNIA WITH MESH ;  Surgeon: Donnice Bury, MD;  Location: [facility] OR;  Service: General;  Laterality: N/A;   INSERTION OF MESH N/A 10/17/2015   Procedure: INSERTION OF MESH;  Surgeon: Donnice Bury, MD;  Location[facility] OR;  Service: General;  Laterality: N/A;   KNEE ARTHROSCOPY Right 1990'S   PILONIDAL CYST EXCISION  2000?    OB History   No obstetric history on file.      Home Medications    Prior to Admission  medications  Medication Sig Start Date End Date Taking? Authorizing Provider  acetaminophen  (TYLENOL ) 500 MG tablet Take 1,000 mg by mouth every 6 (six) hours as needed for moderate pain or headache.   Yes [provider]  atorvastatin  (LIPITOR) 20 MG tablet Take 20 mg by mouth daily.  02/16/13  Yes [provider]  cholecalciferol  (VITAMIN D3) 25 MCG (1000 UNIT) tablet Take 1,000 Units by mouth daily.   Yes [provider]  diclofenac (VOLTAREN) 75 MG EC tablet Take 1 tablet (75 mg total) by mouth 2 (two) times daily as needed for mild pain (pain score 1-3) or moderate pain (pain score 4-6). 06/15/24  Yes Andra Corean BROCKS, PA-C  levothyroxine  (SYNTHROID ) 175 MCG tablet Take 1 tablet (175 mcg total) by mouth Saturday and Sunday. 10/15/23  Yes   losartan  (COZAAR ) 100 MG tablet Take 1 tablet (100 mg total) by mouth daily. 12/21/23  Yes   tiZANidine  (ZANAFLEX ) 4 MG tablet Take 1 tablet (4 mg total) by mouth every 8 (eight) hours as needed. 09/25/23  Yes   Vitamin D , Ergocalciferol , (DRISDOL ) 1.25 MG (50000 UNIT) CAPS capsule Take 1 capsule (50,000 Units total) by mouth once a week. 10/15/23  Yes   albuterol  (VENTOLIN  HFA) 108 (90 Base) MCG/ACT inhaler INHALE 2 PUFFS INTO THE LUNGS EVERY 4 (FOUR) HOURS AS NEEDED FOR WHEEZING OR SHORTNESS OF BREATH. Patient not taking: Reported on 06/15/2024 03/19/20 03/19/21  Raenelle Coria, MD  albuterol  (VENTOLIN  HFA) 108 (90 Base) MCG/ACT inhaler Inhale 1-2 puffs into the lungs every 4 (four) hours as needed for wheezing or shortness of breath. 09/17/20   Mortenson, Ashley, MD  albuterol  (VENTOLIN  HFA) 108 (90 Base) MCG/ACT inhaler Inhale 1 puff into the lungs every 4 (four) hours. 05/13/21     albuterol  (VENTOLIN  HFA) 108 (90 Base) MCG/ACT inhaler Inhale 1 puff into the lungs every 4 (four) hours as needed 09/03/23     amoxicillin  (AMOXIL ) 500 MG capsule Take 1 capsule (500 mg total) by mouth every 8 (eight) hours until finished. Patient not taking:  Reported on 06/15/2024 05/25/23     amoxicillin -clavulanate (AUGMENTIN ) 500-125 MG tablet amoxicillin  500 mg-potassium clavulanate 125 mg tablet  TAKE 1 TABLET BY MOUTH 2 TIMES A DAY FOR 7 DAYS Patient not taking: Reported on 06/15/2024    [provider]  aspirin  81 MG chewable tablet Chew 81 mg by mouth daily. Patient not taking: Reported on 06/15/2024    [provider]  atorvastatin  (LIPITOR) 20 MG tablet TAKE 1/2 TABLET BY MOUTH ONCE A DAY 07/23/20 07/23/21  Clarice Nottingham, MD  atorvastatin  (LIPITOR) 20 MG tablet TAKE 1/2 TABLET BY MOUTH ONCE A DAY 09/22/19 09/21/20  Clarice Nottingham, MD  atorvastatin  (LIPITOR) 20 MG tablet Take 1/2 tablet (10 mg total) by mouth daily. 12/21/23     azelastine  (OPTIVAR ) 0.05 % ophthalmic solution Instill 1 drop 2 (two) times daily in affected eye for 7 days Patient not taking: Reported on 06/15/2024 01/16/22  benzonatate  (TESSALON ) 200 MG capsule Take 1 capsule (200 mg total) by mouth 3 (three) times daily as needed for cough. Patient not taking: Reported on 06/15/2024 10/09/23   Darral Longs, MD  cefdinir  (OMNICEF ) 300 MG capsule     [provider]  cephALEXin  (KEFLEX ) 500 MG capsule Take 1 capsule (500 mg total) by mouth 4 (four) times daily. Patient not taking: Reported on 06/15/2024 03/29/24     clonazePAM  (KLONOPIN ) 0.5 MG tablet Take 0.25-0.5 mg by mouth 2 (two) times daily as needed for anxiety.     [provider]  clonazePAM  (KLONOPIN ) 0.5 MG tablet Take 1/2-1 tablet (0.25-0.5 mg total) by mouth 2 (two) times daily as needed. 07/02/23     clotrimazole -betamethasone  (LOTRISONE ) cream Apply topically 2 (two) times daily. Patient not taking: Reported on 06/15/2024 04/28/24     erythromycin  ophthalmic ointment Place a 1/2 inch ribbon of ointment into the lower eyelid 4 times daily. Patient not taking: Reported on 06/15/2024 08/22/22   Hazen Darryle BRAVO, FNP  fluconazole  (DIFLUCAN ) 150 MG tablet Take 1 tablet (150 mg total) by mouth  daily. Patient not taking: Reported on 06/15/2024 04/28/24     fluticasone  (FLONASE ) 50 MCG/ACT nasal spray Place 2 sprays into both nostrils in the morning and at bedtime. Patient not taking: Reported on 06/15/2024 10/10/20   Stuart Vernell Norris, PA-C  hydrOXYzine  (ATARAX ) 25 MG tablet Take 1 tablet (25 mg total) by mouth every 6 (six) hours as needed. 01/19/24     ibuprofen  (ADVIL ) 600 MG tablet Take 1 tablet (600 mg total) by mouth 3 (three) times daily with food or milk as needed 05/13/21     ibuprofen  (ADVIL ,MOTRIN ) 800 MG tablet Take 1 tablet (800 mg total) by mouth 3 (three) times daily. Patient not taking: Reported on 06/15/2024 05/12/18   Rolinda Rogue, MD  levothyroxine  (SYNTHROID ) 150 MCG tablet Take 1 tablet (150 mcg total) by mouth on Saturday and every other Sunday. Patient not taking: Reported on 06/15/2024 12/05/22     levothyroxine  (SYNTHROID ) 150 MCG tablet Take 1 tablet (150 mcg total) by mouth on Saturdays and every other Sunday Patient not taking: Reported on 06/15/2024 05/07/23     levothyroxine  (SYNTHROID ) 150 MCG tablet Take 1 tablet ( ) by mouth Monday through Friday Patient not taking: Reported on 06/15/2024 10/15/23     levothyroxine  (SYNTHROID ) 175 MCG tablet TAKE 1 TABLET BY MOUTH ONCE A DAY MONDAY THROUGH FRIDAY. 05/22/20 05/22/21  Clarice Nottingham, MD  levothyroxine  (SYNTHROID ) 175 MCG tablet Take 1 tablet (175 mcg total) by mouth daily Monday through Friday. 09/10/23     losartan  (COZAAR ) 100 MG tablet Take 1 tablet (100 mg total) by mouth daily. 03/01/21     losartan  (COZAAR ) 50 MG tablet Take 50 mg by mouth daily. Patient not taking: Reported on 06/15/2024    [provider]  losartan  (COZAAR ) 50 MG tablet TAKE 1 TABLET BY MOUTH ONCE DAILY Patient not taking: Reported on 06/15/2024 01/12/20 01/11/21  Clarice Nottingham, MD  Multiple Vitamin (MULTIVITAMIN) tablet Take 1 tablet by mouth daily. Patient not taking: Reported on 06/15/2024    [provider]   pantoprazole  (PROTONIX ) 40 MG tablet Take 40 mg by mouth daily. Patient not taking: Reported on 06/15/2024    [provider]  Spacer/Aero-Holding Chambers (AEROCHAMBER PLUS) inhaler Use with inhaler Patient not taking: Reported on 06/15/2024 09/17/20   Van Knee, MD  tiZANidine  (ZANAFLEX ) 4 MG tablet Take 4 mg by mouth every 6 (six) hours as needed for  muscle spasms.    [provider]  tiZANidine  (ZANAFLEX ) 4 MG tablet Take 1 tablet (4 mg total) by mouth every 8 (eight) hours as needed 11/02/20       Family History Family History  Problem Relation Age of Onset   Pancreatic cancer Father    Esophageal cancer Father    Cancer Father        esophagus, lungs   Diabetes Mother    Heart disease Mother    COPD Mother    Lung cancer Mother    Cancer Paternal Grandmother        breast   Asthma Daughter     Social History Social History[1]   Allergies   Tramadol, Aspirin , Hydrocodone-acetaminophen , Percocet [oxycodone -acetaminophen ], Chocolate, Vicodin [hydrocodone-acetaminophen ], and Methocarbamol   Review of Systems Review of Systems   Physical Exam Triage Vital Signs ED Triage Vitals  Encounter Vitals Group     BP 06/15/24 1644 (!) 149/83     Girls Systolic BP Percentile --      Girls Diastolic BP Percentile --      Boys Systolic BP Percentile --      Boys Diastolic BP Percentile --      Pulse Rate 06/15/24 1644 (!) 58     Resp 06/15/24 1644 16     Temp 06/15/24 1644 97.9 F (36.6 C)     Temp Source 06/15/24 1644 Oral     SpO2 06/15/24 1644 98 %     Weight --      Height --      Head Circumference --      Peak Flow --      Pain Score 06/15/24 1637 7     Pain Loc --      Pain Education --      Exclude from Growth Chart --    No data found.  Updated Vital Signs BP (!) 149/83 (BP Location: Left Arm)   Pulse (!) 58   Temp 97.9 F (36.6 C) (Oral)   Resp 16   SpO2 98%   Visual Acuity Right Eye Distance:   Left Eye Distance:    Bilateral Distance:    Right Eye Near:   Left Eye Near:    Bilateral Near:     Physical Exam Vitals and nursing note reviewed.  Constitutional:      General: She is not in acute distress.    Appearance: Normal appearance. She is not ill-appearing, toxic-appearing or diaphoretic.  Eyes:     General: No scleral icterus. Cardiovascular:     Rate and Rhythm: Normal rate and regular rhythm.     Heart sounds: Normal heart sounds.  Pulmonary:     Effort: Pulmonary effort is normal. No respiratory distress.     Breath sounds: Normal breath sounds. No wheezing or rhonchi.  Musculoskeletal:     Right hand: Normal range of motion.     Comments: Phalen's test reproduced numbness and tingling in right thumb.   Skin:    General: Skin is warm.  Neurological:     Mental Status: She is alert and oriented to person, place, and time.  Psychiatric:        Mood and Affect: Mood normal.        Behavior: Behavior normal.      UC Treatments / Results  Labs (all labs ordered are listed, but only abnormal results are displayed) Labs Reviewed - No data to display  EKG   Radiology No results found.  Procedures Procedures (  including critical care time)  Medications Ordered in UC Medications - No data to display  Initial Impression / Assessment and Plan / UC Course  I have reviewed the triage vital signs and the nursing notes.  Pertinent labs & imaging results that were available during my care of the patient were reviewed by me and considered in my medical decision making (see chart for details).     Final Clinical Impressions(s) / UC Diagnoses   Final diagnoses:  Carpal tunnel syndrome of right wrist   Discharge Instructions   None    ED Prescriptions     Medication Sig Dispense Auth. Provider   diclofenac (VOLTAREN) 75 MG EC tablet Take 1 tablet (75 mg total) by mouth 2 (two) times daily as needed for mild pain (pain score 1-3) or moderate pain (pain score 4-6). 30 tablet  Andra Corean BROCKS, PA-C      PDMP not reviewed this encounter.    [1]  Social History Tobacco Use   Smoking status: Former    Current packs/day: 0.00    Average packs/day: 0.5 packs/day for 15.0 years (7.5 ttl pk-yrs)    Types: Cigarettes    Start date: 11/18/1983    Quit date: 11/18/1998    Years since quitting: 25.5   Smokeless tobacco: Never  Vaping Use   Vaping status: Never Used  Substance Use Topics   Alcohol use: No   Drug use: No     Andra Corean BROCKS, PA-C 06/15/24 1734  "

## 2024-06-15 NOTE — ED Triage Notes (Signed)
 Pt reports R wrist & hand pain and numbness that started last night. Pt reports noticing some initial tenderness and numbness below her thumb joint that spread across her hand and wrist. Tender to touch. Numbness and tingling intermittently if hand is laid in the right position. Denies injury or trauma to area. Pt reports she was tested for carpal tunnel 10 years ago and was negative. Pt said the only thing she can think of that may cause this pain is helping her granddaughter open up a drink bottle yesterday. No med use for symptoms.

## 2024-06-21 ENCOUNTER — Other Ambulatory Visit (HOSPITAL_COMMUNITY): Payer: Self-pay

## 2024-06-21 MED ORDER — CEFUROXIME AXETIL 500 MG PO TABS
500.0000 mg | ORAL_TABLET | Freq: Two times a day (BID) | ORAL | 0 refills | Status: AC
  Filled 2024-06-21: qty 20, 10d supply, fill #0

## 2024-06-24 ENCOUNTER — Other Ambulatory Visit: Payer: Self-pay | Admitting: Registered Nurse

## 2024-06-24 DIAGNOSIS — Z1231 Encounter for screening mammogram for malignant neoplasm of breast: Secondary | ICD-10-CM

## 2024-06-28 ENCOUNTER — Other Ambulatory Visit: Payer: Self-pay | Admitting: Registered Nurse

## 2024-06-28 DIAGNOSIS — N644 Mastodynia: Secondary | ICD-10-CM

## 2024-07-05 ENCOUNTER — Other Ambulatory Visit (HOSPITAL_COMMUNITY): Payer: Self-pay

## 2024-07-05 ENCOUNTER — Other Ambulatory Visit: Payer: Self-pay

## 2024-07-05 MED ORDER — LOSARTAN POTASSIUM 100 MG PO TABS
100.0000 mg | ORAL_TABLET | Freq: Every day | ORAL | 1 refills | Status: AC
  Filled 2024-07-05: qty 90, 90d supply, fill #0

## 2024-07-12 ENCOUNTER — Ambulatory Visit

## 2024-07-12 ENCOUNTER — Other Ambulatory Visit

## 2024-07-12 ENCOUNTER — Encounter

## 2024-07-12 ENCOUNTER — Ambulatory Visit
Admission: RE | Admit: 2024-07-12 | Discharge: 2024-07-12 | Disposition: A | Source: Ambulatory Visit | Attending: Registered Nurse | Admitting: Registered Nurse

## 2024-07-12 DIAGNOSIS — N644 Mastodynia: Secondary | ICD-10-CM

## 2024-07-15 ENCOUNTER — Other Ambulatory Visit: Payer: Self-pay | Admitting: Registered Nurse

## 2024-07-15 DIAGNOSIS — M7989 Other specified soft tissue disorders: Secondary | ICD-10-CM

## 2024-07-27 ENCOUNTER — Other Ambulatory Visit
